# Patient Record
Sex: Female | Born: 1965
Health system: Southern US, Community
[De-identification: ages and names within clinical notes are randomized; demographics above are authoritative.]

## PROBLEM LIST (undated history)

## (undated) DIAGNOSIS — N920 Excessive and frequent menstruation with regular cycle: Secondary | ICD-10-CM

## (undated) DIAGNOSIS — Z8759 Personal history of other complications of pregnancy, childbirth and the puerperium: Secondary | ICD-10-CM

## (undated) DIAGNOSIS — E039 Hypothyroidism, unspecified: Secondary | ICD-10-CM

## (undated) DIAGNOSIS — Z9889 Other specified postprocedural states: Secondary | ICD-10-CM

## (undated) DIAGNOSIS — I10 Essential (primary) hypertension: Secondary | ICD-10-CM

## (undated) DIAGNOSIS — M1990 Unspecified osteoarthritis, unspecified site: Secondary | ICD-10-CM

## (undated) DIAGNOSIS — G43909 Migraine, unspecified, not intractable, without status migrainosus: Secondary | ICD-10-CM

## (undated) DIAGNOSIS — R112 Nausea with vomiting, unspecified: Secondary | ICD-10-CM

## (undated) DIAGNOSIS — Z87442 Personal history of urinary calculi: Secondary | ICD-10-CM

## (undated) DIAGNOSIS — K219 Gastro-esophageal reflux disease without esophagitis: Secondary | ICD-10-CM

## (undated) HISTORY — DX: Migraine, unspecified, not intractable, without status migrainosus: G43.909

---

## 1997-12-28 ENCOUNTER — Ambulatory Visit (HOSPITAL_COMMUNITY): Admission: RE | Admit: 1997-12-28 | Discharge: 1997-12-28 | Payer: Self-pay | Admitting: Gastroenterology

## 1998-07-18 ENCOUNTER — Ambulatory Visit (HOSPITAL_COMMUNITY): Admission: RE | Admit: 1998-07-18 | Discharge: 1998-07-18 | Payer: Self-pay | Admitting: Family Medicine

## 1998-07-18 ENCOUNTER — Encounter: Payer: Self-pay | Admitting: Family Medicine

## 1999-01-29 ENCOUNTER — Other Ambulatory Visit: Admission: RE | Admit: 1999-01-29 | Discharge: 1999-01-29 | Payer: Self-pay | Admitting: Obstetrics and Gynecology

## 2000-01-30 ENCOUNTER — Other Ambulatory Visit: Admission: RE | Admit: 2000-01-30 | Discharge: 2000-01-30 | Payer: Self-pay | Admitting: Obstetrics and Gynecology

## 2000-04-10 ENCOUNTER — Other Ambulatory Visit: Admission: RE | Admit: 2000-04-10 | Discharge: 2000-04-10 | Payer: Self-pay | Admitting: *Deleted

## 2000-04-10 ENCOUNTER — Encounter (INDEPENDENT_AMBULATORY_CARE_PROVIDER_SITE_OTHER): Payer: Self-pay | Admitting: Specialist

## 2000-10-05 ENCOUNTER — Other Ambulatory Visit: Admission: RE | Admit: 2000-10-05 | Discharge: 2000-10-05 | Payer: Self-pay | Admitting: Obstetrics and Gynecology

## 2000-11-16 ENCOUNTER — Other Ambulatory Visit: Admission: RE | Admit: 2000-11-16 | Discharge: 2000-11-16 | Payer: Self-pay | Admitting: Obstetrics and Gynecology

## 2001-01-01 ENCOUNTER — Encounter (INDEPENDENT_AMBULATORY_CARE_PROVIDER_SITE_OTHER): Payer: Self-pay

## 2001-01-01 ENCOUNTER — Other Ambulatory Visit: Admission: RE | Admit: 2001-01-01 | Discharge: 2001-01-01 | Payer: Self-pay | Admitting: Obstetrics and Gynecology

## 2001-06-01 ENCOUNTER — Other Ambulatory Visit: Admission: RE | Admit: 2001-06-01 | Discharge: 2001-06-01 | Payer: Self-pay | Admitting: Obstetrics and Gynecology

## 2001-11-29 ENCOUNTER — Other Ambulatory Visit: Admission: RE | Admit: 2001-11-29 | Discharge: 2001-11-29 | Payer: Self-pay | Admitting: Obstetrics and Gynecology

## 2002-05-26 ENCOUNTER — Other Ambulatory Visit: Admission: RE | Admit: 2002-05-26 | Discharge: 2002-05-26 | Payer: Self-pay | Admitting: Obstetrics and Gynecology

## 2002-11-14 ENCOUNTER — Other Ambulatory Visit: Admission: RE | Admit: 2002-11-14 | Discharge: 2002-11-14 | Payer: Self-pay | Admitting: Obstetrics and Gynecology

## 2003-06-07 ENCOUNTER — Other Ambulatory Visit: Admission: RE | Admit: 2003-06-07 | Discharge: 2003-06-07 | Payer: Self-pay | Admitting: Obstetrics and Gynecology

## 2003-08-23 ENCOUNTER — Other Ambulatory Visit: Admission: RE | Admit: 2003-08-23 | Discharge: 2003-08-23 | Payer: Self-pay | Admitting: Obstetrics and Gynecology

## 2004-02-13 ENCOUNTER — Other Ambulatory Visit: Admission: RE | Admit: 2004-02-13 | Discharge: 2004-02-13 | Payer: Self-pay | Admitting: Obstetrics and Gynecology

## 2004-03-07 ENCOUNTER — Ambulatory Visit (HOSPITAL_COMMUNITY): Admission: RE | Admit: 2004-03-07 | Discharge: 2004-03-07 | Payer: Self-pay | Admitting: Obstetrics and Gynecology

## 2004-07-12 ENCOUNTER — Other Ambulatory Visit: Admission: RE | Admit: 2004-07-12 | Discharge: 2004-07-12 | Payer: Self-pay | Admitting: Obstetrics and Gynecology

## 2005-05-07 ENCOUNTER — Other Ambulatory Visit: Admission: RE | Admit: 2005-05-07 | Discharge: 2005-05-07 | Payer: Self-pay | Admitting: Gynecology

## 2005-10-29 ENCOUNTER — Other Ambulatory Visit: Admission: RE | Admit: 2005-10-29 | Discharge: 2005-10-29 | Payer: Self-pay | Admitting: Gynecology

## 2005-11-18 ENCOUNTER — Inpatient Hospital Stay (HOSPITAL_COMMUNITY): Admission: AD | Admit: 2005-11-18 | Discharge: 2005-11-18 | Payer: Self-pay | Admitting: Gynecology

## 2005-11-24 ENCOUNTER — Ambulatory Visit: Admission: AD | Admit: 2005-11-24 | Discharge: 2005-11-24 | Payer: Self-pay | Admitting: Obstetrics and Gynecology

## 2005-11-24 ENCOUNTER — Encounter (INDEPENDENT_AMBULATORY_CARE_PROVIDER_SITE_OTHER): Payer: Self-pay | Admitting: Specialist

## 2005-11-24 HISTORY — PX: LAPAROSCOPIC UNILATERAL SALPINGECTOMY: SHX5934

## 2006-05-20 ENCOUNTER — Other Ambulatory Visit: Admission: RE | Admit: 2006-05-20 | Discharge: 2006-05-20 | Payer: Self-pay | Admitting: Gynecology

## 2007-05-26 ENCOUNTER — Other Ambulatory Visit: Admission: RE | Admit: 2007-05-26 | Discharge: 2007-05-26 | Payer: Self-pay | Admitting: Gynecology

## 2007-10-07 ENCOUNTER — Ambulatory Visit (HOSPITAL_COMMUNITY): Admission: RE | Admit: 2007-10-07 | Discharge: 2007-10-07 | Payer: Self-pay | Admitting: Obstetrics and Gynecology

## 2007-10-07 ENCOUNTER — Encounter (INDEPENDENT_AMBULATORY_CARE_PROVIDER_SITE_OTHER): Payer: Self-pay | Admitting: Obstetrics and Gynecology

## 2007-10-07 HISTORY — PX: DILATION AND EVACUATION: SHX1459

## 2010-11-21 ENCOUNTER — Other Ambulatory Visit: Payer: Self-pay | Admitting: Family Medicine

## 2010-11-21 ENCOUNTER — Ambulatory Visit
Admission: RE | Admit: 2010-11-21 | Discharge: 2010-11-21 | Disposition: A | Discharge status: Home / Self Care | Payer: Self-pay | Source: Ambulatory Visit | Attending: Family Medicine | Admitting: Family Medicine

## 2010-12-24 NOTE — Op Note (Signed)
NAMEKORYNNE, DOLS                ACCOUNT NO.:  [id number]   MEDICAL RECORD NO.:  [id number]          PATIENT TYPE:  AMB   LOCATION:  SDC                           FACILITY:  [facility]   PHYSICIAN:  Guy Sandifer. Henderson Cloud, M.D. DATE OF BIRTH:  10/28/1965   DATE OF PROCEDURE:  10/07/2007  DATE OF DISCHARGE:                               OPERATIVE REPORT   PREOPERATIVE DIAGNOSIS:  Spontaneous abortion.   POSTOPERATIVE DIAGNOSIS:  Spontaneous abortion.   PROCEDURE:  1. Dilatation and evacuation.  2. 1% Xylocaine paracervical block.   SURGEON:  Harold Hedge, MD.   ANESTHESIA:  MAC.   SPECIMENS:  Products of conception to pathology as well as a portion for  karyotype.   ESTIMATED BLOOD LOSS:  Minimal.   INDICATIONS AND CONSENT:  This patient is a 45 year old, married, white  female who has a 9-week miscarriage on ultrasound.  Dilatation and  evacuation has been discussed with the patient.  The potential risks and  complications have been discussed preoperatively including but not  limited to infection, uterine perforation, organ damage, bleeding  requiring transfusion of blood products with possible HIV and hepatitis  acquisition.  All questions were answered and consent is signed on the  chart.  We also discussed sending a portion of the tissue for karyotype.   PROCEDURE:  The patient was taken to the operating room where she was  identified, placed in dorsal supine position and she was given  intravenous sedation.  She was then placed in the dorsal lithotomy  position where she was gently prepped, bladder straight catheterized and  she was draped in a sterile fashion.  A bivalve speculum was placed.  The cervical lip was injected with 1% Xylocaine plain and grasped with a  single-tooth tenaculum. Paracervical block was then placed at the  2, 4,  5, 7, 8 and 10 o'clock positions with approximately 20 mL total of 1%  plain Xylocaine. The cervix was then gently progressively dilated to  a  31 dilator.  A #9 curved suction curette is then placed and suction  curettage is carried out for products of conception.  After the initial  pass of the curette, 20 units of Pitocin per liter of IV fluids were  started in her IV.  Alternating sharp and suction curettage is carried  out  and the cavity is clean and good hemostasis was noted.  All instruments  were removed.  All counts were correct.  A portion of the tissue was  sent for karyotype.  Blood type is O+.  The patient will be discharged  home on Methergine 0.2 mg p.o. t.i.d. for 6 doses.  Follow-up is in the  office in 2 weeks.      Guy Sandifer Henderson Cloud, M.D.  Electronically Signed     JET/MEDQ  D:  10/07/2007  T:  10/08/2007  Job:  [id number]

## 2010-12-27 NOTE — Op Note (Signed)
[name], [name]                ACCOUNT NO.:  [id number]   MEDICAL RECORD NO.:  [id number]          PATIENT TYPE:  AMB   LOCATION:  DFTL                          FACILITY:  [facility]   PHYSICIAN:  Guy Sandifer. Henderson Cloud, M.D. DATE OF BIRTH:  01-Nov-1965   DATE OF PROCEDURE:  11/24/2005  DATE OF DISCHARGE:                                 OPERATIVE REPORT   PREOPERATIVE DIAGNOSIS:  Left ectopic pregnancy.   POSTOPERATIVE DIAGNOSIS:  Left ectopic pregnancy.   PROCEDURE:  Laparoscopy with left salpingectomy.   SURGEON:  Harold Hedge, MD   ANESTHESIA:  General with endotracheal intubation, Dr. Malen Gauze.   SPECIMENS:  Left fallopian tube.   ESTIMATED BLOOD LOSS:  Drops.   INDICATIONS AND CONSENT:  This patient is a 45 year old married white female  G1 P0 with bilateral tubal occlusion diagnosed on hysterosalpingogram in  2005.  She is been followed in Dr. Corwin Levins office and was found to have a  left ectopic pregnancy.  She received a methotrexate injection approximately  1 week ago.  Her quantitative HCG's have steadily risen from 8000-19,000.  On ultrasound was also been the new development of a heart beat in the left  ectopic pregnancy.  The patient was then referred for surgical management.  Preoperatively I carefully discussed with the patient and her husband the  above findings.  We discussed laparoscopy with left salpingectomy.  We also  had a thorough discussion of the right fallopian tube.  After reviewing the  options the patient her husband wanted to retain the tube it looked  relatively normal.  If it was heavily involved with adhesions or otherwise  looked abnormal, they wanted right salpingectomy as well.  Potential risks  and complications were reviewed preoperatively including but limited to  infection, bowel, bladder, ureteral damage, bleeding requiring transfusion  of blood products with possible transfusion reaction, HIV, hepatitis  acquisition, DVT, PE, pneumonia,  laparotomy.  All questions were answered  and consent signed on the chart.   FINDINGS:  Upper abdomen is grossly normal.  Appendix was normal.  Uterus  contains several 2 cm subserosal uterine leiomyomata on the fundus.  The  right fallopian tube is relatively thin, but otherwise normal in appearance  throughout its length.  The right ovary is also relatively thin but free.  The left fallopian tube contains a 3-4 cm ectopic pregnancy in the distal  one-third.  The left ovary has a corpus luteum cyst.  The posterior cul-de-  sac contains a pseudo window and a single thin band of adhesion.  Anterior  cul-de-sac is normal.   PROCEDURE:  The patient is taken to operating room where she was identified,  placed in dorsal supine position and general anesthesia is induced via  endotracheal intubation.  She is then placed in dorsal lithotomy position,  prepped abdominally and vaginally.  Bladder straight catheterized.  Hulka  tenaculum was placed.  Uterus was manipulated, she is draped in sterile  fashion.  The infraumbilical and suprapubic areas were injected with 1/2%  plain Marcaine.  Infraumbilical incision was made and a disposable Veress  needle was  placed.  Normal syringe and drop test was noted.  2 liters of gas  were insufflated under low pressure with good tympany in the right upper  quadrant.  Veress needle was then removed.  A 10/11 disposable bladeless  Excel trocar sleeve was placed using direct visualization with the  diagnostic laparoscopic.  After placement the diagnostic scope was replaced  with the operative laparoscope.  A small suprapubic incision was made and a  5 mm bladeless Excel disposable trocar sleeve was placed under direct  visualization without difficulty.  The above findings were noted.  Using the  gyrus bipolar cautery cutting instrument.  The mesosalpinx of the left  fallopian tube is taken down throughout its length and the proximal portion  of the tube was  transected after cautery.  Excellent hemostasis was  maintained.  The left fallopian tube was then deposited in the posterior cul-  de-sac.  Then changing to the 5 mm laparoscope through the suprapubic trocar  sleeve the EndoCatch is placed through the umbilical trocar sleeve.  The  ectopic pregnancy and fallopian tube were then easily removed via the  umbilical incision without difficulty and under direct observation.  Switching back to the operative laparoscope, careful inspection and  irrigation reveals excellent hemostasis under reduced pneumoperitoneum.  Excess fluid is removed.  Suprapubic trocar sleeve was removed and good  hemostasis was noted.  After completely reducing the pneumoperitoneum, the  umbilical trocar sleeve was removed.  A 0 Vicryl suture was used to  reapproximate the subcutaneous layers of the umbilical incision with care  being taken not to pick up any underlying structures.  3-0 Vicryl was then  used to reapproximate the edges of the umbilical incision.  Dermabond was  applied to both incisions.  Hulka tenaculum was removed and good hemostasis  was noted.  All counts correct.  The patient is awakened, taken to recovery  room in stable condition.  Blood type is O+.  Follow-up is in the office in  2 weeks.      Guy Sandifer Henderson Cloud, M.D.  Electronically Signed     JET/MEDQ  D:  11/24/2005  T:  11/25/2005  Job:  [id number]   cc:   Leatha Gilding. Mezer, M.D.  Fax: [phone]

## 2011-05-02 LAB — CBC
Platelets: 316
RBC: 4.11
WBC: 12.4 — ABNORMAL HIGH

## 2011-05-07 ENCOUNTER — Other Ambulatory Visit: Payer: Self-pay | Admitting: Obstetrics and Gynecology

## 2011-08-29 ENCOUNTER — Other Ambulatory Visit: Payer: Self-pay | Admitting: Obstetrics and Gynecology

## 2013-07-21 ENCOUNTER — Ambulatory Visit
Admission: RE | Admit: 2013-07-21 | Discharge: 2013-07-21 | Disposition: A | Discharge status: Home / Self Care | Payer: Self-pay | Source: Ambulatory Visit | Attending: Orthopedic Surgery | Admitting: Orthopedic Surgery

## 2013-07-21 ENCOUNTER — Other Ambulatory Visit: Payer: Self-pay | Admitting: Orthopedic Surgery

## 2013-07-21 DIAGNOSIS — M25571 Pain in right ankle and joints of right foot: Secondary | ICD-10-CM

## 2013-10-28 ENCOUNTER — Encounter (HOSPITAL_COMMUNITY): Payer: Self-pay | Admitting: Emergency Medicine

## 2013-10-28 ENCOUNTER — Emergency Department (HOSPITAL_COMMUNITY)
Admission: EM | Admit: 2013-10-28 | Discharge: 2013-10-28 | Disposition: A | Discharge status: Home / Self Care | Payer: 59 | Attending: Emergency Medicine | Admitting: Emergency Medicine

## 2013-10-28 ENCOUNTER — Emergency Department (HOSPITAL_COMMUNITY): Payer: 59

## 2013-10-28 DIAGNOSIS — N949 Unspecified condition associated with female genital organs and menstrual cycle: Secondary | ICD-10-CM | POA: Insufficient documentation

## 2013-10-28 DIAGNOSIS — Z79899 Other long term (current) drug therapy: Secondary | ICD-10-CM | POA: Insufficient documentation

## 2013-10-28 DIAGNOSIS — E079 Disorder of thyroid, unspecified: Secondary | ICD-10-CM | POA: Insufficient documentation

## 2013-10-28 DIAGNOSIS — R112 Nausea with vomiting, unspecified: Secondary | ICD-10-CM | POA: Insufficient documentation

## 2013-10-28 DIAGNOSIS — N938 Other specified abnormal uterine and vaginal bleeding: Secondary | ICD-10-CM | POA: Insufficient documentation

## 2013-10-28 DIAGNOSIS — N201 Calculus of ureter: Secondary | ICD-10-CM | POA: Insufficient documentation

## 2013-10-28 LAB — COMPREHENSIVE METABOLIC PANEL
ALBUMIN: 3.9 g/dL (ref 3.5–5.2)
ALK PHOS: 72 U/L (ref 39–117)
ALT: 24 U/L (ref 0–35)
AST: 32 U/L (ref 0–37)
BUN: 19 mg/dL (ref 6–23)
CO2: 23 mEq/L (ref 19–32)
Calcium: 9.2 mg/dL (ref 8.4–10.5)
Chloride: 102 mEq/L (ref 96–112)
Creatinine, Ser: 0.91 mg/dL (ref 0.50–1.10)
GFR calc Af Amer: 87 mL/min — ABNORMAL LOW (ref >90)
GFR calc non Af Amer: 75 mL/min — ABNORMAL LOW (ref >90)
Glucose, Bld: 137 mg/dL — ABNORMAL HIGH (ref 70–99)
POTASSIUM: 4.3 meq/L (ref 3.7–5.3)
Sodium: 139 mEq/L (ref 137–147)
TOTAL PROTEIN: 7.2 g/dL (ref 6.0–8.3)
Total Bilirubin: 0.4 mg/dL (ref 0.3–1.2)

## 2013-10-28 LAB — CBC WITH DIFFERENTIAL/PLATELET
BASOS ABS: 0 10*3/uL (ref 0.0–0.1)
BASOS PCT: 0 % (ref 0–1)
EOS ABS: 0.1 10*3/uL (ref 0.0–0.7)
Eosinophils Relative: 1 % (ref 0–5)
HCT: 40.1 % (ref 36.0–46.0)
HEMOGLOBIN: 13.8 g/dL (ref 12.0–15.0)
Lymphocytes Relative: 13 % (ref 12–46)
Lymphs Abs: 1.4 10*3/uL (ref 0.7–4.0)
MCH: 31.5 pg (ref 26.0–34.0)
MCHC: 34.4 g/dL (ref 30.0–36.0)
MCV: 91.6 fL (ref 78.0–100.0)
MONOS PCT: 5 % (ref 3–12)
Monocytes Absolute: 0.5 10*3/uL (ref 0.1–1.0)
NEUTROS PCT: 81 % — AB (ref 43–77)
Neutro Abs: 8.7 10*3/uL — ABNORMAL HIGH (ref 1.7–7.7)
PLATELETS: 264 10*3/uL (ref 150–400)
RBC: 4.38 MIL/uL (ref 3.87–5.11)
RDW: 12.6 % (ref 11.5–15.5)
WBC: 10.7 10*3/uL — ABNORMAL HIGH (ref 4.0–10.5)

## 2013-10-28 LAB — URINALYSIS, ROUTINE W REFLEX MICROSCOPIC
BILIRUBIN URINE: NEGATIVE
Glucose, UA: NEGATIVE mg/dL
Ketones, ur: NEGATIVE mg/dL
LEUKOCYTES UA: NEGATIVE
NITRITE: NEGATIVE
PH: 7.5 (ref 5.0–8.0)
Protein, ur: NEGATIVE mg/dL
SPECIFIC GRAVITY, URINE: 1.015 (ref 1.005–1.030)
Urobilinogen, UA: 0.2 mg/dL (ref 0.0–1.0)

## 2013-10-28 LAB — URINE MICROSCOPIC-ADD ON

## 2013-10-28 LAB — POC URINE PREG, ED: Preg Test, Ur: NEGATIVE

## 2013-10-28 LAB — LIPASE, BLOOD: LIPASE: 24 U/L (ref 11–59)

## 2013-10-28 MED ORDER — SODIUM CHLORIDE 0.9 % IV SOLN
1000.0000 mL | INTRAVENOUS | Status: DC
  Administered 2013-10-28: 1000 mL via INTRAVENOUS

## 2013-10-28 MED ORDER — KETOROLAC TROMETHAMINE 30 MG/ML IJ SOLN
30.0000 mg | Freq: Once | INTRAMUSCULAR | Status: AC
  Administered 2013-10-28: 30 mg via INTRAVENOUS
  Filled 2013-10-28: qty 1

## 2013-10-28 MED ORDER — SODIUM CHLORIDE 0.9 % IV SOLN
1000.0000 mL | Freq: Once | INTRAVENOUS | Status: AC
  Administered 2013-10-28: 1000 mL via INTRAVENOUS

## 2013-10-28 MED ORDER — PROMETHAZINE HCL 25 MG/ML IJ SOLN
12.5000 mg | Freq: Once | INTRAMUSCULAR | Status: AC
  Administered 2013-10-28: 12.5 mg via INTRAVENOUS
  Filled 2013-10-28: qty 1

## 2013-10-28 MED ORDER — OXYCODONE-ACETAMINOPHEN 5-325 MG PO TABS: 1.0000 | ORAL_TABLET | Freq: Four times a day (QID) | ORAL | Status: DC | PRN

## 2013-10-28 MED ORDER — ONDANSETRON HCL 4 MG/2ML IJ SOLN
4.0000 mg | Freq: Once | INTRAMUSCULAR | Status: AC
  Administered 2013-10-28: 4 mg via INTRAVENOUS
  Filled 2013-10-28: qty 2

## 2013-10-28 MED ORDER — HYDROMORPHONE HCL PF 1 MG/ML IJ SOLN
1.0000 mg | INTRAMUSCULAR | Status: DC | PRN
  Administered 2013-10-28 (×2): 1 mg via INTRAVENOUS
  Filled 2013-10-28 (×2): qty 1

## 2013-10-28 MED ORDER — PROMETHAZINE HCL 25 MG PO TABS: 25.0000 mg | ORAL_TABLET | Freq: Four times a day (QID) | ORAL | Status: DC | PRN

## 2013-10-28 NOTE — ED Notes (Signed)
Pt complains of left flank pain since 430 am, has had one kidney stone before and the pain is different this time, pt has been vomiting

## 2013-10-28 NOTE — Discharge Instructions (Signed)
Kidney Stones Kidney stones (urolithiasis) are solid masses that form inside your kidneys. The intense pain is caused by the stone moving through the kidney, ureter, bladder, and urethra (urinary tract). When the stone moves, the ureter starts to spasm around the stone. The stone is usually passed in your pee (urine).  HOME CARE  Drink enough fluids to keep your pee clear or pale yellow. This helps to get the stone out.  Strain all pee through the provided strainer. Do not pee without peeing through the strainer, not even once. If you pee the stone out, catch it in the strainer. The stone may be as small as a grain of salt. Take this to your doctor. This will help your doctor figure out what you can do to try to prevent more kidney stones.  Only take medicine as told by your doctor.  Follow up with your doctor as told.  Get follow-up X-rays as told by your doctor. GET HELP IF: You have pain that gets worse even if you have been taking pain medicine. GET HELP RIGHT AWAY IF:   Your pain does not get better with medicine.  You have a fever or shaking chills.  Your pain increases and gets worse over 18 hours.  You have new belly (abdominal) pain.  You feel faint or pass out.  You are unable to pee. MAKE SURE YOU:   Understand these instructions.  Will watch your condition.  Will get help right away if you are not doing well or get worse. Document Released: 01/14/2008 Document Revised: 03/30/2013 Document Reviewed: 12/29/2012 Exit[employer] Patient Information 2014 [employer].  

## 2013-10-28 NOTE — ED Provider Notes (Signed)
CSN: [id number]     Arrival date & time 10/28/13  [id number] History  First MD Initiated Contact with Patient 10/28/13 [phone]     Chief Complaint  Patient presents with  . Flank Pain    Patient is a 48 y.o. female presenting with flank pain. The history is provided by the patient.  Flank Pain This is a new problem. The current episode started 3 to 5 hours ago. The problem occurs constantly. Associated symptoms include abdominal pain. Pertinent negatives include no chest pain and no headaches. Nothing (she can't get comfortable staying still) aggravates the symptoms. Nothing relieves the symptoms. She has tried nothing for the symptoms.  Pt has a history of kidney stones.  This feels similar.  She is currently on her menses so she is not sure if there is blood in her urine.  Past Medical History  Diagnosis Date  . Thyroid disease    History reviewed. No pertinent past surgical history. History reviewed. No pertinent family history. History  Substance Use Topics  . Smoking status: Never Smoker   . Smokeless tobacco: Not on file  . Alcohol Use: No   OB History   Grav Para Term Preterm Abortions TAB SAB Ect Mult Living                 Review of Systems  Constitutional: Negative for fever.  Cardiovascular: Negative for chest pain.  Gastrointestinal: Positive for nausea, vomiting and abdominal pain. Negative for diarrhea.  Genitourinary: Positive for flank pain and vaginal bleeding (currently on menses).  Neurological: Negative for headaches.  All other systems reviewed and are negative.      Allergies  Review of patient's allergies indicates no known allergies.  Home Medications   Current Outpatient Rx  Name  Route  Sig  Dispense  Refill  . calcium-vitamin D (OSCAL WITH D) 500-200 MG-UNIT per tablet   Oral   Take 2 tablets by mouth daily with breakfast.         . ibuprofen (ADVIL,MOTRIN) 200 MG tablet   Oral   Take 400 mg by mouth every 4 (four) hours as needed for moderate  pain or cramping.         Marland Kitchen levothyroxine (SYNTHROID, LEVOTHROID) 88 MCG tablet   Oral   Take 88 mcg by mouth daily before breakfast.         . Multiple Vitamin (MULTIVITAMIN WITH MINERALS) TABS tablet   Oral   Take 1 tablet by mouth daily.         Marland Kitchen oxyCODONE-acetaminophen (ROXICET) 5-325 MG per tablet   Oral   Take 1-2 tablets by mouth every 6 (six) hours as needed for severe pain.   30 tablet   0   . promethazine (PHENERGAN) 25 MG tablet   Oral   Take 1 tablet (25 mg total) by mouth every 6 (six) hours as needed for nausea or vomiting.   30 tablet   0    BP 124/73  Pulse 82  Temp(Src) 97.5 F (36.4 C) (Oral)  Resp 18  SpO2 100%  LMP 10/28/2013 Physical Exam  Nursing note and vitals reviewed. Constitutional: She appears well-developed and well-nourished. No distress.  HENT:  Head: Normocephalic and atraumatic.  Right Ear: External ear normal.  Left Ear: External ear normal.  Eyes: Conjunctivae are normal. Right eye exhibits no discharge. Left eye exhibits no discharge. No scleral icterus.  Neck: Neck supple. No tracheal deviation present.  Cardiovascular: Normal rate, regular rhythm and intact distal pulses.  Pulmonary/Chest: Effort normal and breath sounds normal. No stridor. No respiratory distress. She has no wheezes. She has no rales.  Abdominal: Soft. Bowel sounds are normal. She exhibits no distension. There is generalized tenderness. There is CVA tenderness (left sided). There is no rebound and no guarding. No hernia.  Musculoskeletal: She exhibits no edema and no tenderness.  Neurological: She is alert. She has normal strength. No cranial nerve deficit (no facial droop, extraocular movements intact, no slurred speech) or sensory deficit. She exhibits normal muscle tone. She displays no seizure activity. Coordination normal.  Skin: Skin is warm and dry. No rash noted.  Psychiatric: She has a normal mood and affect.    ED Course  Procedures (including  critical care time) Labs Review Labs Reviewed  CBC WITH DIFFERENTIAL - Abnormal; Notable for the following:    WBC 10.7 (*)    Neutrophils Relative % 81 (*)    Neutro Abs 8.7 (*)    All other components within normal limits  COMPREHENSIVE METABOLIC PANEL - Abnormal; Notable for the following:    Glucose, Bld 137 (*)    GFR calc non Af Amer 75 (*)    GFR calc Af Amer 87 (*)    All other components within normal limits  URINALYSIS, ROUTINE W REFLEX MICROSCOPIC - Abnormal; Notable for the following:    Hgb urine dipstick SMALL (*)    All other components within normal limits  LIPASE, BLOOD  URINE MICROSCOPIC-ADD ON  POC URINE PREG, ED   Imaging Review Ct Abdomen Pelvis Wo Contrast  10/28/2013   CLINICAL DATA:  Left flank pain.  EXAM: CT ABDOMEN AND PELVIS WITHOUT CONTRAST  TECHNIQUE: Multidetector CT imaging of the abdomen and pelvis was performed following the standard protocol without intravenous contrast.  COMPARISON:  11/21/2010  FINDINGS: Dependent atelectasis in the lungs. No effusions. Heart is normal size.  5 mm stone at the left ureterovesical junction. Mild left hydronephrosis and perinephric stranding. No renal or ureteral stones on the right. Small hypodensities are scattered within the right lobe of the liver. These cannot be characterized due to the noncontrast nature of the study and their small size but appear stated since prior study and are most likely small cysts. Spleen, pancreas, adrenals have an unremarkable unenhanced appearance.  Nodular contours of the uterus suggesting fibroids. Urinary bladder and adnexa are unremarkable. Appendix is visualized and is normal.  IMPRESSION: 5 mm left UVJ stone with mild left hydronephrosis and perinephric stranding.  Uterine fibroids.   Electronically Signed   By: Rolm Baptise M.D.   On: 10/28/2013 09:10   Medications  0.9 %  sodium chloride infusion (1,000 mLs Intravenous New Bag/Given 10/28/13 0732)    Followed by  0.9 %  sodium  chloride infusion (1,000 mLs Intravenous New Bag/Given 10/28/13 0828)  HYDROmorphone (DILAUDID) injection 1 mg (1 mg Intravenous Given 10/28/13 0840)  ondansetron (ZOFRAN) injection 4 mg (4 mg Intravenous Given 10/28/13 0732)  ondansetron (ZOFRAN) injection 4 mg (4 mg Intravenous Given 10/28/13 0840)  ketorolac (TORADOL) 30 MG/ML injection 30 mg (30 mg Intravenous Given 10/28/13 0938)  promethazine (PHENERGAN) injection 12.5 mg (12.5 mg Intravenous Given 10/28/13 0938)     0940  Pt is still having persistent nausea and vomiting.   Will try phenergan.  CT scan confirms ureteral stone .  Will attempt to control her pain and nausea.   1109  Feeling better.  No further vomiting.  Will make sure she can tolerate po.  If so dc with  urine strainer and pain meds. MDM   Final diagnoses:  Ureteral stone   No renal insufficiency.  Dc home with pain meds and antiemetics.  Urology follow up    Kathalene Frames, MD 10/28/13 1110

## 2013-11-28 ENCOUNTER — Other Ambulatory Visit: Payer: Self-pay | Admitting: Urology

## 2014-01-06 ENCOUNTER — Ambulatory Visit (HOSPITAL_BASED_OUTPATIENT_CLINIC_OR_DEPARTMENT_OTHER): Admission: RE | Admit: 2014-01-06 | Payer: 59 | Source: Ambulatory Visit | Admitting: Urology

## 2014-01-06 ENCOUNTER — Encounter (HOSPITAL_BASED_OUTPATIENT_CLINIC_OR_DEPARTMENT_OTHER): Admission: RE | Payer: Self-pay | Source: Ambulatory Visit

## 2014-01-06 SURGERY — CYSTOSCOPY/RETROGRADE/URETEROSCOPY
Anesthesia: General

## 2014-08-11 HISTORY — PX: DILATATION & CURETTAGE/HYSTEROSCOPY WITH MYOSURE: SHX6511

## 2014-11-02 ENCOUNTER — Other Ambulatory Visit: Payer: Self-pay

## 2014-11-03 LAB — CYTOLOGY - PAP

## 2015-03-08 ENCOUNTER — Other Ambulatory Visit: Payer: Self-pay | Admitting: Obstetrics and Gynecology

## 2015-09-21 ENCOUNTER — Other Ambulatory Visit: Payer: Self-pay | Admitting: Physician Assistant

## 2015-09-21 DIAGNOSIS — D485 Neoplasm of uncertain behavior of skin: Secondary | ICD-10-CM | POA: Diagnosis not present

## 2015-09-21 DIAGNOSIS — D239 Other benign neoplasm of skin, unspecified: Secondary | ICD-10-CM | POA: Diagnosis not present

## 2015-09-21 DIAGNOSIS — D2271 Melanocytic nevi of right lower limb, including hip: Secondary | ICD-10-CM | POA: Diagnosis not present

## 2015-09-28 MED FILL — RELPAX 40 MG TABLET: 40 | 23 days supply | Qty: 6 | Fill #0

## 2015-10-12 MED FILL — AZITHROMYCIN 250 MG TABLET: 250 | 5 days supply | Qty: 6 | Fill #0

## 2015-10-31 MED FILL — RELPAX 40 MG TABLET: 40 | 23 days supply | Qty: 6 | Fill #0

## 2015-11-07 DIAGNOSIS — Z01419 Encounter for gynecological examination (general) (routine) without abnormal findings: Secondary | ICD-10-CM | POA: Diagnosis not present

## 2015-11-07 DIAGNOSIS — Z6828 Body mass index (BMI) 28.0-28.9, adult: Secondary | ICD-10-CM | POA: Diagnosis not present

## 2015-12-14 MED FILL — RELPAX 40 MG TABLET: 40 | 23 days supply | Qty: 6 | Fill #1

## 2016-01-09 MED FILL — RELPAX 40 MG TABLET: 40 | 30 days supply | Qty: 9 | Fill #0

## 2016-02-20 ENCOUNTER — Telehealth: Payer: Self-pay | Admitting: Family

## 2016-02-20 DIAGNOSIS — J069 Acute upper respiratory infection, unspecified: Secondary | ICD-10-CM

## 2016-02-20 MED FILL — RELPAX 40 MG TABLET: 40 | 21 days supply | Qty: 6 | Fill #0

## 2016-02-20 NOTE — Progress Notes (Signed)

## 2016-02-26 DIAGNOSIS — J329 Chronic sinusitis, unspecified: Secondary | ICD-10-CM | POA: Diagnosis not present

## 2016-02-26 DIAGNOSIS — G43009 Migraine without aura, not intractable, without status migrainosus: Secondary | ICD-10-CM | POA: Diagnosis not present

## 2016-02-26 MED FILL — AMOX-CLAV 875-125 MG TABLET: 875-125 | 10 days supply | Qty: 20 | Fill #0

## 2016-02-28 DIAGNOSIS — E039 Hypothyroidism, unspecified: Secondary | ICD-10-CM | POA: Diagnosis not present

## 2016-02-28 DIAGNOSIS — Z0000 Encounter for general adult medical examination without abnormal findings: Secondary | ICD-10-CM | POA: Diagnosis not present

## 2016-02-28 DIAGNOSIS — J988 Other specified respiratory disorders: Secondary | ICD-10-CM | POA: Diagnosis not present

## 2016-03-10 DIAGNOSIS — E7800 Pure hypercholesterolemia, unspecified: Secondary | ICD-10-CM | POA: Diagnosis not present

## 2016-03-10 DIAGNOSIS — E039 Hypothyroidism, unspecified: Secondary | ICD-10-CM | POA: Diagnosis not present

## 2016-03-14 MED FILL — ELETRIPTAN HBR 40 MG TABLET: 40 | 30 days supply | Qty: 9 | Fill #0

## 2016-04-08 MED FILL — ELETRIPTAN HBR 40 MG TABLET: 40 | 30 days supply | Qty: 9 | Fill #1

## 2016-04-09 DIAGNOSIS — D239 Other benign neoplasm of skin, unspecified: Secondary | ICD-10-CM | POA: Diagnosis not present

## 2016-05-06 ENCOUNTER — Encounter: Payer: 59 | Attending: Family Medicine | Admitting: Dietician

## 2016-05-06 DIAGNOSIS — Z713 Dietary counseling and surveillance: Secondary | ICD-10-CM | POA: Insufficient documentation

## 2016-05-06 MED FILL — ELETRIPTAN HBR 40 MG TABLET: 40 | 30 days supply | Qty: 9 | Fill #2

## 2016-05-06 NOTE — Progress Notes (Signed)
  Medical Nutrition Therapy:  Appt start time: 0740 end time:  0840.   Assessment:  Primary concerns today: [name] is here today to make sure she is eating right since her mother thinks she is "obese". She would also like her clothes to fit better than they do. Weight now is highest in her life. Lost her husband 3 years ago and lost weight at that time (got down to 145 lbs) and was doing a lot more activity and had cut out soda and some starchy foods.   Has been going to the gym for the past year and works out with a Clinical research associate and family is "big boned". Feels more toned than she was before. Has tried diet plans like doing 2 protein shakes per day where she lost about 10 lbs.  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 Lives by herself. Does not skip meals. Gets lunch brought in to work on Tuesday and goes out to eat on payday (cheesesteak).   Suffers from migraines which she gets often. Stays away from nitrates, a lot of salt, sugar, and caffeine since they trigger migraines.  Would love to get weight back to 140-150 lbs (losing 20 lbs).  Planning to get back to water aerobics soon. Has some financial issues which make paying for exercise difficult.   Voiced some resistance with changes suggested.  Preferred Learning Style:   No preference indicated   Learning Readiness:   Contemplating   MEDICATIONS: see list   DIETARY INTAKE:  Usual eating pattern includes 3 meals and 0-3 snacks per day.  Avoided foods include brussels sprouts, asparagus, pork    24-hr recall:  B ( AM): protein shake from grocery store with fruit, almonds, unsweet almond milk with part of banana or brunch on weekends Snk ( AM):cottage cheese and fruit or yogurt L ( PM): might skip on weeekend or sandwich ([place] or chicken with cheese) and sometimes with yogurt and fruit Snk ( PM): sometimes yogurt or chocolate or nuts  D ( PM): cook on Sunday, then cooks mid week - peas with breaded chicken breast or  spaghetti with [place] meat or taco bake pie or steak/fish with salad/steamed veggies and baked potato/rice or chicken pot pie Snk ( PM): ice cream or cookies  Beverages: water, soda at lunch, zero calorie flavored water, sweet tea or lemonade 1 x week    Usual physical activity: walking dogs 4 miles per day, trainer 2 x week for 30 minutes (going to change gyms soon)  Estimated energy needs: 1800 calories 200 g carbohydrates 135 g protein 50 g fat  Progress Towards Goal(s):  In progress.   Nutritional Diagnosis:  [place]-3.3 Overweight/obesity As related to hx of large portion sizes .  As evidenced by BMI of 28.1 and 20-30 lb weight gain in past 3 years.    Intervention:  Nutrition counseling provided. Plan: Try to increase vegetables at lunch and maybe some dinner meals. Try adding carrots/celery to side of lunch meal with a little salad dressing/dip. Have carbs with protein for meals/snacks. For breakfast - continue with protein shake or oatmeal with protein or cottage cheese with fruit.  Teaching Method Utilized:  Visual Auditory Hands on  Handouts given during visit include:  MyPlate Handout  Meal card  15 g CHO snacks  Barriers to learning/adherence to lifestyle change: stress, financial issues  Demonstrated degree of understanding via:  Teach Back   Monitoring/Evaluation:  Dietary intake, exercise, and body weight in 1 month(s).

## 2016-05-06 NOTE — Patient Instructions (Addendum)
Try to increase vegetables at lunch and maybe some dinner meals. Try adding carrots/celery to side of lunch meal with a little salad dressing/dip. Have carbs with protein for meals/snacks. For breakfast - continue with protein shake or oatmeal with protein or cottage cheese with fruit.

## 2016-05-09 DIAGNOSIS — J069 Acute upper respiratory infection, unspecified: Secondary | ICD-10-CM | POA: Diagnosis not present

## 2016-05-09 DIAGNOSIS — Z23 Encounter for immunization: Secondary | ICD-10-CM | POA: Diagnosis not present

## 2016-05-23 DIAGNOSIS — E039 Hypothyroidism, unspecified: Secondary | ICD-10-CM | POA: Diagnosis not present

## 2016-06-05 ENCOUNTER — Encounter: Payer: 59 | Attending: Family Medicine | Admitting: Dietician

## 2016-06-05 DIAGNOSIS — Z713 Dietary counseling and surveillance: Secondary | ICD-10-CM | POA: Insufficient documentation

## 2016-06-05 NOTE — Progress Notes (Signed)
Medical Nutrition Therapy:  Appt start time: 0520 end time:  615   Assessment:  Primary concerns today: [name] returns today with a 5 lb weight gain in past month. Trying to add more vegetables and increasing exercise. Also using smaller plates. Trying to not have seconds. Overall eating smaller portions. Drinking more water.   Cokes will help when she has a migraine.  Hunger level varies throughout the date. Sometimes get really hungry, salads fill her up but then she gets hungry again soon after.    She would also like her clothes to fit better than they do. Weight now is highest in her life. Lost her husband 3 years ago and lost weight at that time (got down to 145 lbs) and was doing a lot more activity and had cut out soda and some starchy foods.   Has been going to the gym for the past year and works out with a Clinical research associate and family is "big boned". Feels more toned than she was before. Has tried diet plans like doing 2 protein shakes per day where she lost about 10 lbs.  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 Lives by herself. Does not skip meals. Gets lunch brought in to work on Tuesday and goes out to eat on payday (cheesesteak).   Suffers from migraines which she gets often. Stays away from nitrates, a lot of salt, sugar, and caffeine since they trigger migraines.  Would love to get weight back to 140-150 lbs (losing 20 lbs).  Planning to get back to water aerobics soon. Has some financial issues which make paying for exercise difficult.   Voiced some resistance with changes suggested.  Preferred Learning Style:   No preference indicated   Learning Readiness:   Contemplating   MEDICATIONS: see list   DIETARY INTAKE:  Usual eating pattern includes 3 meals and 0-3 snacks per day.  Avoided foods include brussels sprouts, asparagus, pork    24-hr recall:  B ( AM): protein shake from grocery store with fruit, almonds, unsweet almond milk with part of banana or  corn beef hash, eggs and bacon on weekend Snk ( AM):none or cottage cheese and fruit or fruit L ( PM): salad with protein and fruit or tuna salad/chicken salad with crackers with yogurt or might go out to eat on weekends and have broiled fish with broccoli and sweet potatoes, meal from rep 1 x week - varies ([place], sandwiches, etc)  Snk ( PM): sometimes [employer] or nuts or peanut butter crackers when has a migraine D ( PM): vegetable and protein and trying to avoid starches  Snk ( PM): cookies  Beverages: water, regular or diet soda at lunch, zero calorie flavored water  Usual physical activity: walking dogs 4 miles per day, gym 2-3 x week classes/water classes  Estimated energy needs: 1800 calories 200 g carbohydrates 135 g protein 50 g fat  Progress Towards Goal(s):  In progress.   Nutritional Diagnosis:  [place]-3.3 Overweight/obesity As related to hx of large portion sizes .  As evidenced by BMI of 28.1 and 20-30 lb weight gain in past 3 years.    Intervention:  Nutrition counseling provided. Plan: Try cutting out soda on days when you don't have migraines. Try to have crystal light/minute maid lemonade at lunch. Continue to watch sugar from candy/cookies. Consider taking a fish oil pill to possibly help with migraines.   Teaching Method Utilized:  Visual Auditory Hands on  Handouts given during visit include:  none  Barriers to learning/adherence to lifestyle change: stress, financial issues  Demonstrated degree of understanding via:  Teach Back   Monitoring/Evaluation:  Dietary intake, exercise, and body weight prn.

## 2016-06-05 NOTE — Patient Instructions (Addendum)
Try cutting out soda on days when you don't have migraines. Try to have crystal light/minute maid lemonade at lunch. Continue to watch sugar from candy/cookies. Consider taking a fish oil pill to possibly help with migraines.

## 2016-06-06 MED FILL — ELETRIPTAN HBR 40 MG TABLET: 40 | 30 days supply | Qty: 9 | Fill #3

## 2016-06-20 DIAGNOSIS — M2240 Chondromalacia patellae, unspecified knee: Secondary | ICD-10-CM | POA: Diagnosis not present

## 2016-06-20 DIAGNOSIS — M7061 Trochanteric bursitis, right hip: Secondary | ICD-10-CM | POA: Diagnosis not present

## 2016-07-01 MED FILL — ELETRIPTAN HBR 40 MG TABLET: 40 | 30 days supply | Qty: 9 | Fill #4

## 2016-07-16 DIAGNOSIS — S0501XA Injury of conjunctiva and corneal abrasion without foreign body, right eye, initial encounter: Secondary | ICD-10-CM | POA: Diagnosis not present

## 2016-07-18 DIAGNOSIS — S0501XD Injury of conjunctiva and corneal abrasion without foreign body, right eye, subsequent encounter: Secondary | ICD-10-CM | POA: Diagnosis not present

## 2016-07-25 MED FILL — ELETRIPTAN HBR 40 MG TABLET: 40 | 30 days supply | Qty: 9 | Fill #5

## 2016-07-29 DIAGNOSIS — G43009 Migraine without aura, not intractable, without status migrainosus: Secondary | ICD-10-CM | POA: Diagnosis not present

## 2016-08-08 DIAGNOSIS — Z1231 Encounter for screening mammogram for malignant neoplasm of breast: Secondary | ICD-10-CM | POA: Diagnosis not present

## 2016-08-14 DIAGNOSIS — N201 Calculus of ureter: Secondary | ICD-10-CM | POA: Diagnosis not present

## 2016-08-14 DIAGNOSIS — R109 Unspecified abdominal pain: Secondary | ICD-10-CM | POA: Diagnosis not present

## 2016-08-14 DIAGNOSIS — N202 Calculus of kidney with calculus of ureter: Secondary | ICD-10-CM | POA: Diagnosis not present

## 2016-08-14 DIAGNOSIS — R310 Gross hematuria: Secondary | ICD-10-CM | POA: Diagnosis not present

## 2016-08-14 MED FILL — TAMSULOSIN HCL 0.4 MG CAP: 0.4 | 30 days supply | Qty: 30 | Fill #0

## 2016-08-14 MED FILL — KETOROLAC 10 MG TABLET: 10 | 5 days supply | Qty: 20 | Fill #0

## 2016-08-14 MED FILL — PROMETHAZINE 12.5 MG TABLET: 12.5 | 5 days supply | Qty: 20 | Fill #0

## 2016-08-21 ENCOUNTER — Telehealth: Payer: 59 | Admitting: Family

## 2016-08-21 DIAGNOSIS — B9689 Other specified bacterial agents as the cause of diseases classified elsewhere: Secondary | ICD-10-CM

## 2016-08-21 DIAGNOSIS — J329 Chronic sinusitis, unspecified: Secondary | ICD-10-CM

## 2016-08-21 MED ORDER — AMOXICILLIN-POT CLAVULANATE 875-125 MG PO TABS: 1.0000 | ORAL_TABLET | Freq: Two times a day (BID) | ORAL | 0 refills | Status: AC

## 2016-08-21 NOTE — Progress Notes (Signed)

## 2016-08-25 MED FILL — ELETRIPTAN HBR 40 MG TABLET: 40 | 30 days supply | Qty: 9 | Fill #6

## 2016-08-26 ENCOUNTER — Telehealth: Payer: Self-pay | Admitting: *Deleted

## 2016-08-26 NOTE — Telephone Encounter (Signed)
Called and spoke to pt. Pt agreeable to come at 4pm instead tomorrow d/t inclement weather. Advised her to check in by 330pm. Bring insurance card, cop ay and updated med list. She verbalized understating.

## 2016-08-27 ENCOUNTER — Ambulatory Visit: Payer: Self-pay | Admitting: Neurology

## 2016-08-27 ENCOUNTER — Ambulatory Visit: Payer: 59 | Admitting: Neurology

## 2016-09-03 ENCOUNTER — Telehealth: Payer: 59 | Admitting: Family

## 2016-09-03 DIAGNOSIS — B3731 Acute candidiasis of vulva and vagina: Secondary | ICD-10-CM

## 2016-09-03 DIAGNOSIS — B373 Candidiasis of vulva and vagina: Secondary | ICD-10-CM | POA: Diagnosis not present

## 2016-09-03 MED ORDER — FLUCONAZOLE 150 MG PO TABS: 150.0000 mg | ORAL_TABLET | ORAL | 0 refills | Status: DC | PRN

## 2016-09-03 NOTE — Progress Notes (Signed)

## 2016-09-04 ENCOUNTER — Telehealth: Payer: 59 | Admitting: Physician Assistant

## 2016-09-04 DIAGNOSIS — R1084 Generalized abdominal pain: Secondary | ICD-10-CM | POA: Diagnosis not present

## 2016-09-04 DIAGNOSIS — N898 Other specified noninflammatory disorders of vagina: Secondary | ICD-10-CM

## 2016-09-04 DIAGNOSIS — R102 Pelvic and perineal pain: Secondary | ICD-10-CM | POA: Diagnosis not present

## 2016-09-04 DIAGNOSIS — R300 Dysuria: Secondary | ICD-10-CM | POA: Diagnosis not present

## 2016-09-04 NOTE — Progress Notes (Signed)
Based on what you shared with me it looks like you have a condition that should be evaluated in a face to face office visit.    You need assessment to check urine and vaginal swab to assess cause of symptoms. Since you are having both vaginal discharge and urinary symptoms there may be a combination of things going on.   If you are having a true medical emergency please call 911.  If you need an urgent face to face visit, [facility] has four urgent care centers for your convenience.  If you need care fast and have a high deductible or no insurance consider:   [email or web address]  [phone]  [address], [place], [place] [address] 8 am to 8 pm Monday-Friday 10 am to 4 pm Saturday-Sunday   The following sites will take your  insurance:    . [facility] a Provider at this Location  [address] [place], [place] [address] . 10 am to 8 pm Monday-Friday . 12 pm to 8 pm Saturday-Sunday   . [facility] [place] a Provider at this Location  [address], [place], [place] [address] . 8 am to 8 pm Monday-Friday . 9 am to 6 pm Saturday . 11 am to 6 pm Sunday   . [facility] at [place] Get Driving Directions  [id number] [place],  [address] . 8 am to 8 pm Monday-Friday . 8 am to 4 pm Saturday-Sunday   Your e-visit answers were reviewed by a board certified advanced clinical practitioner to complete your personal care plan.  Thank you for using e-Visits.

## 2016-09-15 ENCOUNTER — Encounter: Payer: Self-pay | Admitting: Neurology

## 2016-09-15 ENCOUNTER — Ambulatory Visit (INDEPENDENT_AMBULATORY_CARE_PROVIDER_SITE_OTHER): Payer: 59 | Admitting: Neurology

## 2016-09-15 ENCOUNTER — Ambulatory Visit: Payer: Self-pay | Admitting: Neurology

## 2016-09-15 VITALS — BP 130/80 | HR 83 | Ht 67.0 in | Wt 178.8 lb

## 2016-09-15 DIAGNOSIS — G43009 Migraine without aura, not intractable, without status migrainosus: Secondary | ICD-10-CM | POA: Diagnosis not present

## 2016-09-15 MED ORDER — TOPIRAMATE 25 MG PO TABS: 75.0000 mg | ORAL_TABLET | Freq: Every day | ORAL | 11 refills | Status: DC

## 2016-09-15 NOTE — Patient Instructions (Addendum)
Remember to drink plenty of fluid, eat healthy meals and do not skip any meals. Try to eat protein with a every meal and eat a healthy snack such as fruit or nuts in between meals. Try to keep a regular sleep-wake schedule and try to exercise daily, particularly in the form of walking, 20-30 minutes a day, if you can.   As far as your medications are concerned, I would like to suggest: Topiramate 75mg  every night   Our phone number is [phone]. We also have an after hours call service for urgent matters and there is a physician on-call for urgent questions. For any emergencies you know to call 911 or go to the nearest emergency room  Topiramate tablets What is this medicine? TOPIRAMATE (toe PYRE a mate) is used to treat seizures in adults or children with epilepsy. It is also used for the prevention of migraine headaches. This medicine may be used for other purposes; ask your health care provider or pharmacist if you have questions. COMMON BRAND NAME(S): Topamax, Topiragen What should I tell my health care provider before I take this medicine? They need to know if you have any of these conditions: -bleeding disorders -cirrhosis of the liver or liver disease -diarrhea -glaucoma -kidney stones or kidney disease -low blood counts, like low white cell, platelet, or red cell counts -lung disease like asthma, obstructive pulmonary disease, emphysema -metabolic acidosis -on a ketogenic diet -schedule for surgery or a procedure -suicidal thoughts, plans, or attempt; a previous suicide attempt by you or a family member -an unusual or allergic reaction to topiramate, other medicines, foods, dyes, or preservatives -pregnant or trying to get pregnant -breast-feeding How should I use this medicine? Take this medicine by mouth with a glass of water. Follow the directions on the prescription label. Do not crush or chew. You may take this medicine with meals. Take your medicine at regular intervals.  Do not take it more often than directed. Talk to your pediatrician regarding the use of this medicine in children. Special care may be needed. While this drug may be prescribed for children as young as 89 years of age for selected conditions, precautions do apply. Overdosage: If you think you have taken too much of this medicine contact a poison control center or emergency room at once. NOTE: This medicine is only for you. Do not share this medicine with others. What if I miss a dose? If you miss a dose, take it as soon as you can. If your next dose is to be taken in less than 6 hours, then do not take the missed dose. Take the next dose at your regular time. Do not take double or extra doses. What may interact with this medicine? Do not take this medicine with any of the following medications: -probenecid This medicine may also interact with the following medications: -acetazolamide -alcohol -amitriptyline -aspirin and aspirin-like medicines -birth control pills -certain medicines for depression -certain medicines for seizures -certain medicines that treat or prevent blood clots like warfarin, enoxaparin, dalteparin, apixaban, dabigatran, and rivaroxaban -digoxin -hydrochlorothiazide -lithium -medicines for pain, sleep, or muscle relaxation -metformin -methazolamide -NSAIDS, medicines for pain and inflammation, like ibuprofen or naproxen -pioglitazone -risperidone This list may not describe all possible interactions. Give your health care provider a list of all the medicines, herbs, non-prescription drugs, or dietary supplements you use. Also tell them if you smoke, drink alcohol, or use illegal drugs. Some items may interact with your medicine. What should I watch for while using  this medicine? Visit your doctor or health care professional for regular checks on your progress. Do not stop taking this medicine suddenly. This increases the risk of seizures if you are using this medicine to  control epilepsy. Wear a medical identification bracelet or chain to say you have epilepsy or seizures, and carry a card that lists all your medicines. This medicine can decrease sweating and increase your body temperature. Watch for signs of deceased sweating or fever, especially in children. Avoid extreme heat, hot baths, and saunas. Be careful about exercising, especially in hot weather. Contact your health care provider right away if you notice a fever or decrease in sweating. You should drink plenty of fluids while taking this medicine. If you have had kidney stones in the past, this will help to reduce your chances of forming kidney stones. If you have stomach pain, with nausea or vomiting and yellowing of your eyes or skin, call your doctor immediately. You may get drowsy, dizzy, or have blurred vision. Do not drive, use machinery, or do anything that needs mental alertness until you know how this medicine affects you. To reduce dizziness, do not sit or stand up quickly, especially if you are an older patient. Alcohol can increase drowsiness and dizziness. Avoid alcoholic drinks. If you notice blurred vision, eye pain, or other eye problems, seek medical attention at once for an eye exam. The use of this medicine may increase the chance of suicidal thoughts or actions. Pay special attention to how you are responding while on this medicine. Any worsening of mood, or thoughts of suicide or dying should be reported to your health care professional right away. This medicine may increase the chance of developing metabolic acidosis. If left untreated, this can cause kidney stones, bone disease, or slowed growth in children. Symptoms include breathing fast, fatigue, loss of appetite, irregular heartbeat, or loss of consciousness. Call your doctor immediately if you experience any of these side effects. Also, tell your doctor about any surgery you plan on having while taking this medicine since this may  increase your risk for metabolic acidosis. Birth control pills may not work properly while you are taking this medicine. Talk to your doctor about using an extra method of birth control. Women who become pregnant while using this medicine may enroll in the [place] Pregnancy Registry by calling [phone]. This registry collects information about the safety of antiepileptic drug use during pregnancy. What side effects may I notice from receiving this medicine? Side effects that you should report to your doctor or health care professional as soon as possible: -allergic reactions like skin rash, itching or hives, swelling of the face, lips, or tongue -decreased sweating and/or rise in body temperature -depression -difficulty breathing, fast or irregular breathing patterns -difficulty speaking -difficulty walking or controlling muscle movements -hearing impairment -redness, blistering, peeling or loosening of the skin, including inside the mouth -tingling, pain or numbness in the hands or feet -unusual bleeding or bruising -unusually weak or tired -worsening of mood, thoughts or actions of suicide or dying Side effects that usually do not require medical attention (report to your doctor or health care professional if they continue or are bothersome): -altered taste -back pain, joint or muscle aches and pains -diarrhea, or constipation -headache -loss of appetite -nausea -stomach upset, indigestion -tremors This list may not describe all possible side effects. Call your doctor for medical advice about side effects. You may report side effects to FDA at 1-800-FDA-1088. Where should I  keep my medicine? Keep out of the reach of children. Store at room temperature between 15 and 30 degrees C (59 and 86 degrees F) in a tightly closed container. Protect from moisture. Throw away any unused medicine after the expiration date. NOTE: This sheet is a summary. It may not  cover all possible information. If you have questions about this medicine, talk to your doctor, pharmacist, or health care provider.  2017 Elsevier/Gold Standard (2013-08-01 23:17:57)
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
## 2016-09-19 ENCOUNTER — Other Ambulatory Visit: Payer: Self-pay | Admitting: Neurology

## 2016-09-19 DIAGNOSIS — D509 Iron deficiency anemia, unspecified: Secondary | ICD-10-CM | POA: Diagnosis not present

## 2016-09-19 DIAGNOSIS — N920 Excessive and frequent menstruation with regular cycle: Secondary | ICD-10-CM | POA: Diagnosis not present

## 2016-09-19 MED ORDER — ELETRIPTAN HYDROBROMIDE 40 MG PO TABS: 40.0000 mg | ORAL_TABLET | ORAL | 11 refills | Status: DC | PRN

## 2016-09-19 NOTE — Addendum Note (Signed)
Addended by: Monte Fantasia on: 09/19/2016 12:11 PM   Modules accepted: Orders

## 2016-09-19 NOTE — Addendum Note (Signed)
Addended by: Monte Fantasia on: 09/19/2016 09:12 AM   Modules accepted: Orders

## 2016-09-19 NOTE — Telephone Encounter (Signed)
New pt visit 09/15/16 for migraines. Topamax was increased to 75 mg. Relpax has been previously prescribed by PCP.

## 2016-09-19 NOTE — Telephone Encounter (Signed)
Patient called requesting refill for eletriptan (RELPAX) 40 MG tablet.  States at her last visit it was to be filled.  Please call

## 2016-09-19 NOTE — Telephone Encounter (Signed)
Pt called wanted to pick up med at lunch between 12-1 if possible. Could RN call her when [facility] has been [facility] to [facility]. Pt did comment that PCP gives her relpax also.

## 2016-09-26 ENCOUNTER — Encounter: Payer: Self-pay | Admitting: Neurology

## 2016-10-03 DIAGNOSIS — N924 Excessive bleeding in the premenopausal period: Secondary | ICD-10-CM | POA: Diagnosis not present

## 2016-10-06 ENCOUNTER — Telehealth: Payer: Self-pay | Admitting: Neurology

## 2016-10-06 NOTE — Telephone Encounter (Signed)
Looks like we just increased it to 75 last time she was here, it may take 4-6 weeks to see full effect so as long as she is stable we can wait. If migraines worsening we would have to change to ER formulation or change meds. See if she will wait to see if the increase we made is helping thanks

## 2016-10-06 NOTE — Telephone Encounter (Signed)
Patient called office in reference to continuing to have tingling/numbness in her fingers.  Per patient her toes and mouth are tingling now and only has 1 eletriptan (RELPAX) 40 MG tablet left due to migraines, but not due for a refill until March 9th.  Please call

## 2016-10-06 NOTE — Telephone Encounter (Signed)
New rx for Relpax #10 e-scribed 09/19/16, refill not due before 10/15/16.

## 2016-10-06 NOTE — Telephone Encounter (Signed)
Please contact patients mobile number is after 5:00pm.

## 2016-10-07 NOTE — Telephone Encounter (Signed)
Talked w/ pt. She has been on Topamax 75 mg x 3 weeks. She continues to have side effects of numbness/tingling in fingers/toes and is not interested in higher dose. Also c/o HA daily, at least 5-6 per week, often starting early in the morning (around 4 am). She has used all of her Relpax w/ next refill not due until 10/15/16. Would like to discuss different med regimen and/or steroid dosepack.

## 2016-10-08 ENCOUNTER — Other Ambulatory Visit: Payer: Self-pay | Admitting: Neurology

## 2016-10-08 DIAGNOSIS — G43001 Migraine without aura, not intractable, with status migrainosus: Secondary | ICD-10-CM

## 2016-10-08 MED ORDER — METHYLPREDNISOLONE 4 MG PO TBPK: ORAL_TABLET | ORAL | 1 refills | Status: DC

## 2016-10-08 MED ORDER — TOPIRAMATE ER 100 MG PO SPRINKLE CAP24: 100.0000 mg | EXTENDED_RELEASE_CAPSULE | Freq: Every day | ORAL | 12 refills | Status: DC

## 2016-10-08 NOTE — Telephone Encounter (Signed)
I already spoke to her thanks!!

## 2016-10-08 NOTE — Telephone Encounter (Signed)
In addition to previous msg, pt became teary eyed, said she is in pain and feels like she has been dropped by the wayside. FYI

## 2016-10-08 NOTE — Telephone Encounter (Signed)
Advised to go back to 50 mg a day. Will call in a medrol dosepack. Call in Topiramate ER.

## 2016-10-08 NOTE — Telephone Encounter (Signed)
Pt called back today, very irritated she has not had a return call. Said she is out of medication and in pain. Please call

## 2016-10-09 ENCOUNTER — Telehealth: Payer: Self-pay | Admitting: Neurology

## 2016-10-09 MED ORDER — TOPIRAMATE ER 100 MG PO CAP24: 100.0000 mg | ORAL_CAPSULE | Freq: Every day | ORAL | 11 refills | Status: DC

## 2016-10-09 NOTE — Telephone Encounter (Signed)
Patient calling to discuss methylPREDNISolone (MEDROL DOSEPAK) 4 MG TBPK tablet. Since taking she feels jumpy, loopy and her heart feels like it is pounding. She is at work so please leave a message.

## 2016-10-09 NOTE — Addendum Note (Signed)
Addended by: Monte Fantasia on: 10/09/2016 11:57 AM   Modules accepted: Orders

## 2016-10-09 NOTE — Telephone Encounter (Signed)
[facility] [address] [facility] and could not get Rx. She says it cost $500 but it was not generic. Please call pharmacy and discuss Topiramate ER (QUDEXY XR) 100 MG CS24 with them. Patient wants to get generic.

## 2016-10-09 NOTE — Telephone Encounter (Signed)
Rx re-sent to pharmacy for generic. Notified pt via TC.

## 2016-10-09 NOTE — Telephone Encounter (Signed)
Discussed w/ Dr. Jaynee Eagles. Called pt and let her know that she is having common side effects when taking medrol dosepak. Advised to complete course of steroids per MD. If symptoms worsen or do not improve, she may call back tomorrow before 12 or go to Urgent Care over the weekend.

## 2016-10-15 ENCOUNTER — Encounter (HOSPITAL_BASED_OUTPATIENT_CLINIC_OR_DEPARTMENT_OTHER): Payer: Self-pay | Admitting: *Deleted

## 2016-10-15 NOTE — Progress Notes (Signed)
NPO AFTER MN.  ARRIVE AT 0600.  GETTING LAB WORK DONE Thursday 10-16-2016 OR Friday 10-17-2016. (CBC, hCG SERUM).  WILL TAKE SYNTHROID AM DOS W/ SIPS OF WATER.

## 2016-10-16 DIAGNOSIS — E039 Hypothyroidism, unspecified: Secondary | ICD-10-CM | POA: Diagnosis not present

## 2016-10-16 DIAGNOSIS — Z791 Long term (current) use of non-steroidal anti-inflammatories (NSAID): Secondary | ICD-10-CM | POA: Diagnosis not present

## 2016-10-16 DIAGNOSIS — Z8042 Family history of malignant neoplasm of prostate: Secondary | ICD-10-CM | POA: Diagnosis not present

## 2016-10-16 DIAGNOSIS — Z8249 Family history of ischemic heart disease and other diseases of the circulatory system: Secondary | ICD-10-CM | POA: Diagnosis not present

## 2016-10-16 DIAGNOSIS — Z9889 Other specified postprocedural states: Secondary | ICD-10-CM | POA: Diagnosis not present

## 2016-10-16 DIAGNOSIS — Z87442 Personal history of urinary calculi: Secondary | ICD-10-CM | POA: Diagnosis not present

## 2016-10-16 DIAGNOSIS — N858 Other specified noninflammatory disorders of uterus: Secondary | ICD-10-CM | POA: Diagnosis not present

## 2016-10-16 DIAGNOSIS — Z800 Family history of malignant neoplasm of digestive organs: Secondary | ICD-10-CM | POA: Diagnosis not present

## 2016-10-16 DIAGNOSIS — Z79899 Other long term (current) drug therapy: Secondary | ICD-10-CM | POA: Diagnosis not present

## 2016-10-16 DIAGNOSIS — Z820 Family history of epilepsy and other diseases of the nervous system: Secondary | ICD-10-CM | POA: Diagnosis not present

## 2016-10-16 DIAGNOSIS — G43909 Migraine, unspecified, not intractable, without status migrainosus: Secondary | ICD-10-CM | POA: Diagnosis not present

## 2016-10-16 DIAGNOSIS — N920 Excessive and frequent menstruation with regular cycle: Secondary | ICD-10-CM | POA: Diagnosis not present

## 2016-10-16 LAB — HCG, SERUM, QUALITATIVE: PREG SERUM: NEGATIVE

## 2016-10-16 LAB — CBC
HEMATOCRIT: 37.6 % (ref 36.0–46.0)
Hemoglobin: 12.4 g/dL (ref 12.0–15.0)
MCH: 30.6 pg (ref 26.0–34.0)
MCHC: 33 g/dL (ref 30.0–36.0)
MCV: 92.8 fL (ref 78.0–100.0)
PLATELETS: 356 10*3/uL (ref 150–400)
RBC: 4.05 MIL/uL (ref 3.87–5.11)
RDW: 14.4 % (ref 11.5–15.5)
WBC: 10 10*3/uL (ref 4.0–10.5)

## 2016-10-22 NOTE — Anesthesia Preprocedure Evaluation (Addendum)
Anesthesia Evaluation  Patient identified by MRN, date of birth, ID band Patient awake    Reviewed: Allergy & Precautions, NPO status , Patient's Chart, lab work & pertinent test results  Airway Mallampati: II  TM Distance: >3 FB Neck ROM: Full    Dental  (+) Dental Advisory Given   Pulmonary neg pulmonary ROS,    breath sounds clear to auscultation       Cardiovascular negative cardio ROS   Rhythm:Regular Rate:Normal     Neuro/Psych  Headaches,    GI/Hepatic negative GI ROS, Neg liver ROS,   Endo/Other  Hypothyroidism   Renal/GU negative Renal ROS     Musculoskeletal   Abdominal   Peds  Hematology negative hematology ROS (+)   Anesthesia Other Findings   Reproductive/Obstetrics                            Lab Results  Component Value Date   WBC 10.0 10/16/2016   HGB 12.4 10/16/2016   HCT 37.6 10/16/2016   MCV 92.8 10/16/2016   PLT 356 10/16/2016   Lab Results  Component Value Date   CREATININE 0.91 10/28/2013   BUN 19 10/28/2013   NA 139 10/28/2013   K 4.3 10/28/2013   CL 102 10/28/2013   CO2 23 10/28/2013    Anesthesia Physical Anesthesia Plan  ASA: II  Anesthesia Plan: General   Post-op Pain Management:    Induction: Intravenous  Airway Management Planned: LMA  Additional Equipment:   Intra-op Plan:   Post-operative Plan: Extubation in OR  Informed Consent: I have reviewed the patients History and Physical, chart, labs and discussed the procedure including the risks, benefits and alternatives for the proposed anesthesia with the patient or authorized representative who has indicated his/her understanding and acceptance.   Dental advisory given  Plan Discussed with:   Anesthesia Plan Comments:        Anesthesia Quick Evaluation

## 2016-10-22 NOTE — H&P (Signed)
[name] is an 51 y.o. female with heavy, irregular bleeding. U/S in office is suspicious for a flat endometrial polyp measuring 14 mm.  Pertinent Gynecological History: Menses: flow is excessive with use of many pads or tampons on heaviest days Bleeding: dysfunctional uterine bleeding Contraception: abstinence DES exposure: denies Blood transfusions: none Sexually transmitted diseases: no past history Previous GYN Procedures: DNC  Last mammogram: normal Date: 2018 Last pap: normal Date: 2017 OB History: G2, P0   Menstrual History: Menarche age: [name] Patient's last menstrual period was 10/15/2016 (approximate).    Past Medical History:  Diagnosis Date  . History of ectopic pregnancy    04/ 2007  S/P LEFT SALPINGECTOMY  . History of kidney stones   . Hypothyroidism   . Menorrhagia   . Migraine     Past Surgical History:  Procedure Laterality Date  . DILATATION & CURETTAGE/HYSTEROSCOPY WITH MYOSURE  2016  . DILATION AND EVACUATION  10/07/2007   retained poc  . LAPAROSCOPIC UNILATERAL SALPINGECTOMY Left 11/24/2005    Family History  Problem Relation Age of Onset  . Hypertension Mother   . Aortic aneurysm Mother   . Liver cancer Father   . Prostate cancer Brother   . Migraines Sister     Social History:  reports that she has never smoked. She has never used smokeless tobacco. She reports that she does not drink alcohol or use drugs.  Allergies: No Known Allergies  No prescriptions prior to admission.    ROS  Height 5\' 6"  (1.676 m), weight 160 lb (72.6 kg), last menstrual period 10/15/2016. Physical Exam  Cardiovascular: Normal rate and regular rhythm.   Respiratory: Effort normal and breath sounds normal.  GI: Soft. There is no tenderness.  Genitourinary: Vagina normal.  Genitourinary Comments: Uterus mobile, NT Adnexa NT without masses  Neurological: She has normal reflexes.    No results found for this or any previous visit (from the past 24  hour(s)).  No results found.  Assessment/Plan: 51 yo G2P0 with menometorrahgia and U/S suspicious for recurrent endometrial polyp D/W patient H/S, D&C with Myosure D/W risks including infection, organ damage, bleeding/transfusion-HIV/Hep, DVT/PE, pneumonia.  [name],[name] E 10/22/2016, 5:45 PM

## 2016-10-23 ENCOUNTER — Ambulatory Visit (HOSPITAL_BASED_OUTPATIENT_CLINIC_OR_DEPARTMENT_OTHER): Payer: 59 | Admitting: Anesthesiology

## 2016-10-23 ENCOUNTER — Encounter (HOSPITAL_BASED_OUTPATIENT_CLINIC_OR_DEPARTMENT_OTHER)
Admission: RE | Disposition: A | Discharge status: Home / Self Care | Payer: Self-pay | Source: Ambulatory Visit | Attending: Obstetrics and Gynecology

## 2016-10-23 ENCOUNTER — Encounter (HOSPITAL_BASED_OUTPATIENT_CLINIC_OR_DEPARTMENT_OTHER): Payer: Self-pay

## 2016-10-23 ENCOUNTER — Ambulatory Visit (HOSPITAL_BASED_OUTPATIENT_CLINIC_OR_DEPARTMENT_OTHER)
Admission: RE | Admit: 2016-10-23 | Discharge: 2016-10-23 | Disposition: A | Discharge status: Home / Self Care | Payer: 59 | Source: Ambulatory Visit | Attending: Obstetrics and Gynecology | Admitting: Obstetrics and Gynecology

## 2016-10-23 DIAGNOSIS — Z800 Family history of malignant neoplasm of digestive organs: Secondary | ICD-10-CM | POA: Insufficient documentation

## 2016-10-23 DIAGNOSIS — Z8042 Family history of malignant neoplasm of prostate: Secondary | ICD-10-CM | POA: Diagnosis not present

## 2016-10-23 DIAGNOSIS — Z820 Family history of epilepsy and other diseases of the nervous system: Secondary | ICD-10-CM | POA: Insufficient documentation

## 2016-10-23 DIAGNOSIS — Z8249 Family history of ischemic heart disease and other diseases of the circulatory system: Secondary | ICD-10-CM | POA: Insufficient documentation

## 2016-10-23 DIAGNOSIS — E039 Hypothyroidism, unspecified: Secondary | ICD-10-CM | POA: Diagnosis not present

## 2016-10-23 DIAGNOSIS — N920 Excessive and frequent menstruation with regular cycle: Secondary | ICD-10-CM | POA: Insufficient documentation

## 2016-10-23 DIAGNOSIS — Z9889 Other specified postprocedural states: Secondary | ICD-10-CM | POA: Insufficient documentation

## 2016-10-23 DIAGNOSIS — N858 Other specified noninflammatory disorders of uterus: Secondary | ICD-10-CM | POA: Diagnosis not present

## 2016-10-23 DIAGNOSIS — Z87442 Personal history of urinary calculi: Secondary | ICD-10-CM | POA: Diagnosis not present

## 2016-10-23 DIAGNOSIS — G43909 Migraine, unspecified, not intractable, without status migrainosus: Secondary | ICD-10-CM | POA: Diagnosis not present

## 2016-10-23 DIAGNOSIS — Z79899 Other long term (current) drug therapy: Secondary | ICD-10-CM | POA: Insufficient documentation

## 2016-10-23 DIAGNOSIS — Z791 Long term (current) use of non-steroidal anti-inflammatories (NSAID): Secondary | ICD-10-CM | POA: Insufficient documentation

## 2016-10-23 DIAGNOSIS — N921 Excessive and frequent menstruation with irregular cycle: Secondary | ICD-10-CM

## 2016-10-23 HISTORY — DX: Excessive and frequent menstruation with regular cycle: N92.0

## 2016-10-23 HISTORY — DX: Personal history of urinary calculi: Z87.442

## 2016-10-23 HISTORY — DX: Personal history of other complications of pregnancy, childbirth and the puerperium: Z87.59

## 2016-10-23 HISTORY — PX: DILATATION & CURETTAGE/HYSTEROSCOPY WITH MYOSURE: SHX6511

## 2016-10-23 HISTORY — DX: Hypothyroidism, unspecified: E03.9

## 2016-10-23 LAB — ABO/RH: ABO/RH(D): O POS

## 2016-10-23 LAB — TYPE AND SCREEN
ABO/RH(D): O POS
Antibody Screen: NEGATIVE

## 2016-10-23 SURGERY — DILATATION & CURETTAGE/HYSTEROSCOPY WITH MYOSURE
Anesthesia: General | Site: Uterus

## 2016-10-23 MED ORDER — LIDOCAINE 2% (20 MG/ML) 5 ML SYRINGE
INTRAMUSCULAR | Status: DC | PRN
  Administered 2016-10-23: 100 mg via INTRAVENOUS

## 2016-10-23 MED ORDER — PROMETHAZINE HCL 25 MG/ML IJ SOLN
6.2500 mg | INTRAMUSCULAR | Status: DC | PRN
  Filled 2016-10-23: qty 1

## 2016-10-23 MED ORDER — FENTANYL CITRATE (PF) 100 MCG/2ML IJ SOLN
INTRAMUSCULAR | Status: AC
  Filled 2016-10-23: qty 2

## 2016-10-23 MED ORDER — SODIUM CHLORIDE 0.9 % IR SOLN
Status: DC | PRN
  Administered 2016-10-23: 3000 mL

## 2016-10-23 MED ORDER — OXYCODONE-ACETAMINOPHEN 5-325 MG PO TABS
1.0000 | ORAL_TABLET | Freq: Four times a day (QID) | ORAL | Status: AC | PRN
  Administered 2016-10-23: 1 via ORAL
  Filled 2016-10-23: qty 1

## 2016-10-23 MED ORDER — CEFAZOLIN SODIUM-DEXTROSE 2-4 GM/100ML-% IV SOLN
INTRAVENOUS | Status: AC
  Filled 2016-10-23: qty 100

## 2016-10-23 MED ORDER — LACTATED RINGERS IV SOLN
INTRAVENOUS | Status: DC
  Filled 2016-10-23: qty 1000

## 2016-10-23 MED ORDER — LACTATED RINGERS IV SOLN
INTRAVENOUS | Status: DC
  Administered 2016-10-23: 07:00:00 via INTRAVENOUS
  Filled 2016-10-23: qty 1000

## 2016-10-23 MED ORDER — PROPOFOL 10 MG/ML IV BOLUS
INTRAVENOUS | Status: DC | PRN
  Administered 2016-10-23: 200 mg via INTRAVENOUS

## 2016-10-23 MED ORDER — ONDANSETRON HCL 4 MG/2ML IJ SOLN
INTRAMUSCULAR | Status: AC
  Filled 2016-10-23: qty 2

## 2016-10-23 MED ORDER — SCOPOLAMINE 1 MG/3DAYS TD PT72
MEDICATED_PATCH | TRANSDERMAL | Status: AC
  Filled 2016-10-23: qty 1

## 2016-10-23 MED ORDER — CEFAZOLIN SODIUM-DEXTROSE 2-4 GM/100ML-% IV SOLN
2.0000 g | INTRAVENOUS | Status: AC
  Administered 2016-10-23: 2 g via INTRAVENOUS
  Filled 2016-10-23: qty 100

## 2016-10-23 MED ORDER — PROPOFOL 10 MG/ML IV BOLUS
INTRAVENOUS | Status: AC
  Filled 2016-10-23: qty 40

## 2016-10-23 MED ORDER — DEXAMETHASONE SODIUM PHOSPHATE 10 MG/ML IJ SOLN
INTRAMUSCULAR | Status: AC
  Filled 2016-10-23: qty 1

## 2016-10-23 MED ORDER — FENTANYL CITRATE (PF) 100 MCG/2ML IJ SOLN
INTRAMUSCULAR | Status: DC | PRN
  Administered 2016-10-23: 50 ug via INTRAVENOUS

## 2016-10-23 MED ORDER — KETOROLAC TROMETHAMINE 30 MG/ML IJ SOLN
INTRAMUSCULAR | Status: AC
  Filled 2016-10-23: qty 1

## 2016-10-23 MED ORDER — SCOPOLAMINE 1 MG/3DAYS TD PT72
1.0000 | MEDICATED_PATCH | TRANSDERMAL | Status: DC
  Administered 2016-10-23: 1.5 mg via TRANSDERMAL
  Filled 2016-10-23: qty 1

## 2016-10-23 MED ORDER — LIDOCAINE 2% (20 MG/ML) 5 ML SYRINGE
INTRAMUSCULAR | Status: AC
  Filled 2016-10-23: qty 5

## 2016-10-23 MED ORDER — ONDANSETRON HCL 4 MG/2ML IJ SOLN
INTRAMUSCULAR | Status: DC | PRN
  Administered 2016-10-23: 4 mg via INTRAVENOUS

## 2016-10-23 MED ORDER — HYDROMORPHONE HCL 1 MG/ML IJ SOLN
0.2500 mg | INTRAMUSCULAR | Status: DC | PRN
  Filled 2016-10-23: qty 0.5

## 2016-10-23 MED ORDER — DEXAMETHASONE SODIUM PHOSPHATE 4 MG/ML IJ SOLN
INTRAMUSCULAR | Status: DC | PRN
  Administered 2016-10-23: 10 mg via INTRAVENOUS

## 2016-10-23 MED ORDER — OXYCODONE-ACETAMINOPHEN 5-325 MG PO TABS
ORAL_TABLET | ORAL | Status: AC
  Filled 2016-10-23: qty 1

## 2016-10-23 MED ORDER — KETOROLAC TROMETHAMINE 30 MG/ML IJ SOLN
INTRAMUSCULAR | Status: DC | PRN
  Administered 2016-10-23: 30 mg via INTRAVENOUS

## 2016-10-23 MED ORDER — MIDAZOLAM HCL 5 MG/5ML IJ SOLN
INTRAMUSCULAR | Status: DC | PRN
  Administered 2016-10-23: 2 mg via INTRAVENOUS

## 2016-10-23 MED ORDER — MIDAZOLAM HCL 2 MG/2ML IJ SOLN
INTRAMUSCULAR | Status: AC
  Filled 2016-10-23: qty 2

## 2016-10-23 MED ORDER — LIDOCAINE HCL 1 % IJ SOLN
INTRAMUSCULAR | Status: DC | PRN
  Administered 2016-10-23: 20 mL

## 2016-10-23 MED FILL — OXYCODONE/APAP 5/325 MG TAB: 5-325 | 2 days supply | Qty: 10 | Fill #0

## 2016-10-23 SURGICAL SUPPLY — 30 items
CATH ROBINSON RED A/P 16FR (CATHETERS) ×2 IMPLANT
COVER BACK TABLE 60X90IN (DRAPES) ×4 IMPLANT
DEVICE MYOSURE LITE (MISCELLANEOUS) IMPLANT
DEVICE MYOSURE REACH (MISCELLANEOUS) IMPLANT
DILATOR CANAL MILEX (MISCELLANEOUS) IMPLANT
DRAPE LG THREE QUARTER DISP (DRAPES) ×2 IMPLANT
DRSG TELFA 3X8 NADH (GAUZE/BANDAGES/DRESSINGS) ×2 IMPLANT
ELECT REM PT RETURN 9FT ADLT (ELECTROSURGICAL)
ELECTRODE REM PT RTRN 9FT ADLT (ELECTROSURGICAL) IMPLANT
FILTER ARTHROSCOPY CONVERTOR (FILTER) ×2 IMPLANT
GLOVE BIO SURGEON STRL SZ7.5 (GLOVE) ×2 IMPLANT
GLOVE BIOGEL PI IND STRL 8 (GLOVE) ×2 IMPLANT
GLOVE BIOGEL PI INDICATOR 8 (GLOVE) ×2
GOWN STRL REUS W/TWL LRG LVL3 (GOWN DISPOSABLE) ×4 IMPLANT
KIT RM TURNOVER CYSTO AR (KITS) ×2 IMPLANT
LEGGING LITHOTOMY PAIR STRL (DRAPES) ×2 IMPLANT
MYOSURE XL FIBROID REM (MISCELLANEOUS)
NDL SPNL 22GX3.5 QUINCKE BK (NEEDLE) ×1 IMPLANT
NEEDLE SPNL 22GX3.5 QUINCKE BK (NEEDLE) ×2 IMPLANT
PACK BASIN DAY SURGERY FS (CUSTOM PROCEDURE TRAY) ×2 IMPLANT
PAD DRESSING TELFA 3X8 NADH (GAUZE/BANDAGES/DRESSINGS) ×1 IMPLANT
PAD OB MATERNITY 4.3X12.25 (PERSONAL CARE ITEMS) ×2 IMPLANT
PAD PREP 24X48 CUFFED NSTRL (MISCELLANEOUS) ×2 IMPLANT
SEAL ROD LENS SCOPE MYOSURE (ABLATOR) ×2 IMPLANT
SYR CONTROL 10ML LL (SYRINGE) ×2 IMPLANT
SYSTEM TISS REMOVAL MYSR XL RM (MISCELLANEOUS) IMPLANT
TOWEL OR 17X24 6PK STRL BLUE (TOWEL DISPOSABLE) ×4 IMPLANT
TUBE CONNECTING 12X1/4 (SUCTIONS) IMPLANT
TUBING AQUILEX INFLOW (TUBING) ×2 IMPLANT
TUBING AQUILEX OUTFLOW (TUBING) ×2 IMPLANT

## 2016-10-23 NOTE — Progress Notes (Signed)
No changes to H&P per patient history Reviewed with patient procedure-H/S, D&C, Myosure Patient states she understands and agrees 

## 2016-10-23 NOTE — Anesthesia Postprocedure Evaluation (Signed)
Anesthesia Post Note  Patient: [name]  Procedure(s) Performed: Procedure(s) (LRB): DILATATION & CURETTAGE/HYSTEROSCOPY (N/A)  Patient location during evaluation: PACU Anesthesia Type: General Level of consciousness: awake and alert Pain management: pain level controlled Vital Signs Assessment: post-procedure vital signs reviewed and stable Respiratory status: spontaneous breathing, nonlabored ventilation, respiratory function stable and patient connected to nasal cannula oxygen Cardiovascular status: blood pressure returned to baseline and stable Postop Assessment: no signs of nausea or vomiting Anesthetic complications: no       Last Vitals:  Vitals:   10/23/16 0915 10/23/16 1000  BP:  113/63  Pulse:  83  Resp: 18   Temp:  36.9 C    Last Pain:  Vitals:   10/23/16 1000  TempSrc:   PainSc: 0-No pain                 Tiajuana Amass

## 2016-10-23 NOTE — Progress Notes (Signed)
10/23/2016  8:06 AM  PATIENT:  [name]  51 y.o. female  PRE-OPERATIVE DIAGNOSIS:  MENORRHAGIA  POST-OPERATIVE DIAGNOSIS:  MENORRHAGIA  PROCEDURE:  Procedure(s): DILATATION & CURETTAGE/HYSTEROSCOPY (N/A)  SURGEON:  Surgeon(s) and Role:    * Everlene Farrier, MD - Primary  PHYSICIAN ASSISTANT:   ASSISTANTS: none   ANESTHESIA:   IV sedation  EBL:  Total I/O In: 100 [I.V.:100] Out: 10 [Blood:10]  BLOOD ADMINISTERED:none  DRAINS: none   LOCAL MEDICATIONS USED:  LIDOCAINE  and Amount: 20 ml  SPECIMEN:  Source of Specimen:  endometrial currettings  DISPOSITION OF SPECIMEN:  PATHOLOGY  COUNTS:  YES  TOURNIQUET:  * No tourniquets in log *  DICTATION: .Other Dictation: Dictation Number [id number]  PLAN OF CARE: Discharge to home after PACU  PATIENT DISPOSITION:  PACU - hemodynamically stable.   Delay start of Pharmacological VTE agent (>24hrs) due to surgical blood loss or risk of bleeding: not applicable

## 2016-10-23 NOTE — Anesthesia Procedure Notes (Signed)
Procedure Name: LMA Insertion Date/Time: 10/23/2016 7:44 AM Performed by: Bethena Roys T Pre-anesthesia Checklist: Patient identified, Emergency Drugs available, Suction available and Patient being monitored Patient Re-evaluated:Patient Re-evaluated prior to inductionOxygen Delivery Method: Circle system utilized Preoxygenation: Pre-oxygenation with 100% oxygen Intubation Type: IV induction Ventilation: Mask ventilation without difficulty LMA: LMA inserted LMA Size: 4.0 Number of attempts: 1 Airway Equipment and Method: Bite block Placement Confirmation: positive ETCO2 Tube secured with: Tape Dental Injury: Teeth and Oropharynx as per pre-operative assessment

## 2016-10-23 NOTE — Discharge Summary (Signed)
Admin LVD:[id number] D/C WYB:[id number] Procedure: H/S, D&C FU: Office 1-2 weeks Meds: Ibuprofen 600mg  po q6 hrs prn            Percocet 5/325, #10, 1-2 po q 6 hrs prn

## 2016-10-23 NOTE — Discharge Instructions (Signed)

## 2016-10-23 NOTE — Transfer of Care (Signed)
Immediate Anesthesia Transfer of Care Note  Patient: [name]  Procedure(s) Performed: Procedure(s): DILATATION & CURETTAGE/HYSTEROSCOPY (N/A)  Patient Location: PACU  Anesthesia Type:General  Level of Consciousness: sedated and responds to stimulation  Airway & Oxygen Therapy: Patient Spontanous Breathing and Patient connected to nasal cannula oxygen  Post-op Assessment: Report given to RN  Post vital signs: Reviewed and stable  Last Vitals:  Vitals:   10/23/16 0604 10/23/16 0817  BP: 121/76 (P) 108/66  Pulse: 94   Resp: 16 (P) 18  Temp: 36.5 C (P) 36.4 C    Last Pain:  Vitals:   10/23/16 0604  TempSrc: Oral      Patients Stated Pain Goal: 7 (10/29/20 4825)  Complications: No apparent anesthesia complications

## 2016-10-24 ENCOUNTER — Encounter (HOSPITAL_BASED_OUTPATIENT_CLINIC_OR_DEPARTMENT_OTHER): Payer: Self-pay | Admitting: Obstetrics and Gynecology

## 2016-10-24 NOTE — Op Note (Signed)
NAME:  [name], [name]                     ACCOUNT NO.:  MEDICAL RECORD NO.:  [id number]  LOCATION:                                 FACILITY:  PHYSICIAN:  Daleen Bo. Gaetano Net, M.D. DATE OF BIRTH:  [birth year]  DATE OF PROCEDURE:  10/23/2016 DATE OF DISCHARGE:                              OPERATIVE REPORT   LOCATION:  [place], [facility], [place], [place].  PREOPERATIVE DIAGNOSIS:  Menorrhagia.  POSTOPERATIVE DIAGNOSIS:  Menorrhagia.  PROCEDURE:  Hysteroscopy, dilation and curettage.  SURGEON:  Daleen Bo. Gaetano Net, M.D.  ANESTHESIA:  IV sedation.  ESTIMATED BLOOD LOSS:  Less than 100 mL.  SPECIMENS:  Endometrial curettings to Pathology.  I and O's of distention medium 30 mL deficit.  INDICATIONS AND CONSENT:  This patient is a 51 year old patient, who has recurrent heavy irregular menses.  Ultrasound was suggestive of an endometrial polyp.  Hysteroscopy, D and C, possible MyoSure are discussed preoperatively.  Potential risks and complications were reviewed preoperatively including, but not limited to infection, uterine perforation, organ damage, bleeding requiring transfusion of blood products with HIV and hepatitis acquisition, DVT, PE, pneumonia.  The patient states she understands and agrees.  Consent was signed on the chart.  FINDINGS:  Both fallopian tube and ostia are identified.  There is a pedunculated 1 cm polypoid mass in the right lower uterine segment.  DESCRIPTION OF PROCEDURE:  The patient was taken to the operating room, where she was placed in a dorsal supine position and she was given sedation.  She was placed in a dorsal lithotomy position.  Time-out undertaken.  She was prepped with Betadine.  Bladder straight catheterized and draped in a sterile fashion.  Bivalve speculum was placed in the vagina.  Anterior cervical lip was injected with 1% plain lidocaine and grasped with single-tooth tenaculum.  Paracervical  block was placed at 2, 4, 5, 7, 8, and 10 o'clock positions with approximately 20 mL of the same solution.  Cervix was gently progressively dilated. Hysteroscope was placed in the endocervical canal and advanced under direct visualization using distending media.  The above findings were noted.  Hysteroscope was withdrawn and sharp curettage was carried out.  Inspection revealed the cavity to be clean.  Final pass of the curette was gently done and all the tissue was sent to Pathology.  Instruments were removed.  Good hemostasis was noted and all counts were correct.  The patient was awakened and taken to recovery room in stable condition.     Daleen Bo Gaetano Net, M.D.     JET/MEDQ  D:  10/23/2016  T:  10/23/2016  Job:  [id number]

## 2016-10-27 ENCOUNTER — Telehealth: Payer: Self-pay | Admitting: Neurology

## 2016-10-27 NOTE — Telephone Encounter (Signed)
New patient seen 09/15/16 for migraines. Only tolerated increasing topiramate IR to 75 mg, was switched to 100 mg ER around 10/08/16. Recently completed medrol dosepak. She also has Relpax ordered prn. Continues to c/o numbness/tingling in hands/feet.

## 2016-10-27 NOTE — Telephone Encounter (Signed)
Patient called office in reference to Topiramate ER 100 MG CP24.  Patient states she is still having the tingling in her fingers, feet and now in her heels.  Patient would like to know when this is suppose to stop or if it will.  Please call

## 2016-10-28 NOTE — Telephone Encounter (Signed)
If it doesn't get better in a week we can change medications. Please discuss with her thanks

## 2016-10-28 NOTE — Telephone Encounter (Signed)
Called pt and relayed Dr. Cathren Laine recommendations to try medication for another week. May call back next week if she would like to try a different medication.

## 2016-10-29 NOTE — Telephone Encounter (Signed)
Pt agreed to try topiramate ER for another week to see if side effects improve. She last filled Relpax on 10/15/16.

## 2016-10-29 NOTE — Telephone Encounter (Signed)
Patient called office in reference to not being able to pick up Relpax until 11/15/16.  Patient states she is continuing to have migraines daily and would like to see if something else can be called in in the mean time.  Patient did mention Imitrex.  Pharmacy- [facility].  Please call patient [phone] until 5 (work number)

## 2016-10-31 MED ORDER — SUMATRIPTAN SUCCINATE 100 MG PO TABS: 100.0000 mg | ORAL_TABLET | Freq: Once | ORAL | 5 refills | Status: DC | PRN

## 2016-10-31 MED FILL — SUMATRIPTAN SUCC 100 MG TAB: 100 | 28 days supply | Qty: 10 | Fill #0

## 2016-10-31 NOTE — Telephone Encounter (Signed)
Discussed w/ Dr. Jaynee Eagles who agreed to rx for Imitrex which was e-scribed to [facility]. Pt notified via TC.

## 2016-10-31 NOTE — Addendum Note (Signed)
Addended by: Monte Fantasia on: 10/31/2016 01:55 PM   Modules accepted: Orders

## 2016-10-31 NOTE — Telephone Encounter (Signed)
Pt called said she has not heard back reg mdicaitons. RN skyped, she will call pt back when they are done with pt's today. Pt advised to please call after 1pm on work number.

## 2016-11-04 DIAGNOSIS — Z0000 Encounter for general adult medical examination without abnormal findings: Secondary | ICD-10-CM | POA: Diagnosis not present

## 2016-11-05 MED FILL — TOPIRAMATE ER 100 MG CAP: 100 | 30 days supply | Qty: 30 | Fill #0

## 2016-11-07 MED FILL — ELETRIPTAN HBR 40 MG TABLET: 40 | 30 days supply | Qty: 9 | Fill #7

## 2016-11-10 DIAGNOSIS — K599 Functional intestinal disorder, unspecified: Secondary | ICD-10-CM | POA: Diagnosis not present

## 2016-11-10 DIAGNOSIS — G43009 Migraine without aura, not intractable, without status migrainosus: Secondary | ICD-10-CM | POA: Diagnosis not present

## 2016-11-10 DIAGNOSIS — Z0000 Encounter for general adult medical examination without abnormal findings: Secondary | ICD-10-CM | POA: Diagnosis not present

## 2016-11-10 MED FILL — AMITRIPTYLINE HCL 10 MG TAB: 10 | 30 days supply | Qty: 30 | Fill #0

## 2016-11-10 MED FILL — LEVOTHYROXINE 88 MCG TABLET: 88 | 90 days supply | Qty: 90 | Fill #0

## 2016-11-11 ENCOUNTER — Encounter: Payer: Self-pay | Admitting: Neurology

## 2016-12-01 MED FILL — TOPIRAMATE ER 100 MG CAP: 100 | 30 days supply | Qty: 30 | Fill #1

## 2016-12-01 MED FILL — ELETRIPTAN HBR 40 MG TABLET: 40 | 30 days supply | Qty: 9 | Fill #0

## 2016-12-08 MED FILL — AMITRIPTYLINE HCL 10 MG TAB: 10 | 30 days supply | Qty: 30 | Fill #1

## 2016-12-25 MED FILL — ELETRIPTAN HBR 40 MG TABLET: 40 | 30 days supply | Qty: 9 | Fill #1

## 2017-01-01 MED FILL — AMITRIPTYLINE HCL 10 MG TAB: 10 | 30 days supply | Qty: 30 | Fill #0

## 2017-01-06 MED FILL — TOPIRAMATE ER 100 MG CAP: 100 | 30 days supply | Qty: 30 | Fill #2

## 2017-01-15 ENCOUNTER — Ambulatory Visit: Payer: 59 | Admitting: Neurology

## 2017-01-20 ENCOUNTER — Ambulatory Visit (INDEPENDENT_AMBULATORY_CARE_PROVIDER_SITE_OTHER): Payer: 59 | Admitting: Neurology

## 2017-01-20 ENCOUNTER — Encounter: Payer: Self-pay | Admitting: Neurology

## 2017-01-20 VITALS — BP 120/60 | HR 84 | Ht 67.0 in | Wt 177.0 lb

## 2017-01-20 DIAGNOSIS — G43709 Chronic migraine without aura, not intractable, without status migrainosus: Secondary | ICD-10-CM | POA: Diagnosis not present

## 2017-01-20 MED ORDER — NORTRIPTYLINE HCL 25 MG PO CAPS
25.0000 mg | ORAL_CAPSULE | Freq: Every day | ORAL | 3 refills | Status: DC
Start: 2017-01-20 — End: 2017-02-18

## 2017-01-20 MED ORDER — ELETRIPTAN HYDROBROMIDE 40 MG PO TABS: ORAL_TABLET | ORAL | 3 refills | Status: DC

## 2017-01-20 MED FILL — NORTRIPTYLINE HCL 25 MG CAP: 25 | 30 days supply | Qty: 30 | Fill #0

## 2017-01-20 MED FILL — ELETRIPTAN HBR 40 MG TABLET: 40 | 30 days supply | Qty: 10 | Fill #0

## 2017-01-20 NOTE — Patient Instructions (Signed)
Migraine Recommendations: 1.  Start nortriptyline 25mg  at bedtime.  Call/MyChart me in 4 weeks with update and we can adjust dose if needed.  Take topiramate 50mg  daily for 1 week and then stop. 2.  Take Relpax at earliest onset of headache.  May repeat dose once in 2 hours if needed.  Do not exceed two tablets. in 24 hours. 3.  Limit use of pain relievers to no more than 2 days out of the week.  These medications include acetaminophen, ibuprofen, triptans and narcotics.  This will help reduce risk of rebound headaches. 4.  Be aware of common food triggers such as processed sweets, processed foods with nitrites (such as deli meat, hot dogs, sausages), foods with MSG, alcohol (such as wine), chocolate, certain cheeses, certain fruits (dried fruits, some citrus fruit), vinegar, diet soda. 4.  Avoid caffeine 5.  Routine exercise 6.  Proper sleep hygiene 7.  Stay adequately hydrated with water 8.  Keep a headache diary. 9.  Maintain proper stress management. 10.  Do not skip meals. 11.  Consider supplements:  Magnesium citrate 400mg  to 600mg  daily, riboflavin 400mg , Coenzyme Q 10 100mg  three times daily 12.  Follow up in 3 to 4 months.

## 2017-01-20 NOTE — Progress Notes (Signed)
NEUROLOGY CONSULTATION NOTE  AMANPREET DELMONT MRN: [id number] DOB: [birth year]  Referring provider: Dr. Harrington Challenger Primary care provider: Dr. Harrington Challenger  Reason for consult:  migraines  HISTORY OF PRESENT ILLNESS: [name] is a 51 year old right-handed female with thyroid disease, migraine and history of recurrent kidney stones who presents for  migraines.  History supplemented by PCP note.  Onset:  Since she was in her 4s, worse since [facility] and hysteroscopy in March 2018. Location:  unilateral/retro-orbital (right more than left) and back of head Quality:  pounding, stabbing Intensity:  "12"/10 Aura:  no Prodrome:  no Postdrome:  no Associated symptoms:  nausea, photophobia, phonophobia.  No vomiting or visual disturbance.  She has not had any new worse headache of her life, waking up from sleep Duration:  30 minutes with triptan (otherwise 1 to 2 days) Frequency:  15 to 20 days per month Frequency of abortive medication: 5 days per week Triggers/exacerbating factors:  stress, certain smells, heat Relieving factors:  dark quiet room Activity:  aggravates  Past NSAIDS:  no Past analgesics:  Excedrin (helps) Past abortive triptans:  no Past muscle relaxants:  no Past anti-emetic:  promethazine Past antihypertensive medications:  no Past antidepressant medications:  no Past anticonvulsant medications:  topiramate immediate release (paresthesias) Past vitamins/Herbal/Supplements:  no Past antihistamines/decongestants:  no Other past therapies:  no  Current NSAIDS:  Advil Current analgesics:  no Current triptans:  Relpax 40mg  (first line), sumatriptan 100mg  Current anti-emetic:  no Current muscle relaxants:  no Current anti-anxiolytic:  no Current sleep aide:  no Current Antihypertensive medications:  no Current Antidepressant medications:  no Current Anticonvulsant medications:  topiramate ER 100mg  Current Vitamins/Herbal/Supplements:  MVI Current  Antihistamines/Decongestants:  no Other therapy:  Chiropractor (for neck pain)  Caffeine:  Only for headache Alcohol:  no Smoker:  no Diet:  Hydrates, watches diet Exercise:  Walks daily Depression/anxiety:  controlled Sleep hygiene:  Good. Family history of headache:  Sister has migraines.  Grandmother had migraines. Other pertinent family history:  sister had a ruptured aneurysm.  PAST MEDICAL HISTORY: Past Medical History:  Diagnosis Date  . History of ectopic pregnancy    04/ 2007  S/P LEFT SALPINGECTOMY  . History of kidney stones   . Hypothyroidism   . Menorrhagia   . Migraine     PAST SURGICAL HISTORY: Past Surgical History:  Procedure Laterality Date  . DILATATION & CURETTAGE/HYSTEROSCOPY WITH MYOSURE  2016  . DILATATION & CURETTAGE/HYSTEROSCOPY WITH MYOSURE N/A 10/23/2016   Procedure: DILATATION & CURETTAGE/HYSTEROSCOPY;  Surgeon: Everlene Farrier, MD;  Location:  [facility];  Service: Gynecology;  Laterality: N/A;  . DILATION AND EVACUATION  10/07/2007   retained poc  . LAPAROSCOPIC UNILATERAL SALPINGECTOMY Left 11/24/2005    MEDICATIONS: Current Outpatient Prescriptions on File Prior to Visit  Medication Sig Dispense Refill  . levothyroxine (SYNTHROID, LEVOTHROID) 88 MCG tablet Take 88 mcg by mouth daily before breakfast.    . Multiple Vitamin (MULTIVITAMIN WITH MINERALS) TABS tablet Take 1 tablet by mouth daily.    . SUMAtriptan (IMITREX) 100 MG tablet Take 1 tablet (100 mg total) by mouth once as needed for migraine. May repeat in 2 hours if headache persists or recurs. 10 tablet 5   No current facility-administered medications on file prior to visit.     ALLERGIES: No Known Allergies  FAMILY HISTORY: Family History  Problem Relation Age of Onset  . Hypertension Mother   . Aortic aneurysm Mother   . Liver cancer Father   .  Prostate cancer Brother   . Migraines Sister     SOCIAL HISTORY: Social History   Social History  . Marital  status: Widowed    Spouse name: N/A  . Number of children: 0  . Years of education: 37   Occupational History  . [employer]    Social History Main Topics  . Smoking status: Never Smoker  . Smokeless tobacco: Never Used  . Alcohol use No  . Drug use: No  . Sexual activity: Not on file   Other Topics Concern  . Not on file   Social History Narrative   Lives at home alone   Right-handed   Caffeine: weekly    REVIEW OF SYSTEMS: Constitutional: No fevers, chills, or sweats, no generalized fatigue, change in appetite Eyes: No visual changes, double vision, eye pain Ear, nose and throat: No hearing loss, ear pain, nasal congestion, sore throat Cardiovascular: No chest pain, palpitations Respiratory:  No shortness of breath at rest or with exertion, wheezes GastrointestinaI: No nausea, vomiting, diarrhea, abdominal pain, fecal incontinence Genitourinary:  No dysuria, urinary retention or frequency Musculoskeletal:  No neck pain, back pain Integumentary: No rash, pruritus, skin lesions Neurological: as above Psychiatric: No depression, insomnia, anxiety Endocrine: No palpitations, fatigue, diaphoresis, mood swings, change in appetite, change in weight, increased thirst Hematologic/Lymphatic:  No purpura, petechiae. Allergic/Immunologic: no itchy/runny eyes, nasal congestion, recent allergic reactions, rashes  PHYSICAL EXAM: Vitals:   01/20/17 0811  BP: 120/60  Pulse: 84   General: No acute distress.  Patient appears well-groomed.  Head:  Normocephalic/atraumatic Eyes:  fundi examined but not visualized Neck: supple, no paraspinal tenderness, full range of motion Back: No paraspinal tenderness Heart: regular rate and rhythm Lungs: Clear to auscultation bilaterally. Vascular: No carotid bruits. Neurological Exam: Mental status: alert and oriented to person, place, and time, recent and remote memory intact, fund of knowledge intact, attention and  concentration intact, speech fluent and not dysarthric, language intact. Cranial nerves: CN I: not tested CN II: pupils equal, round and reactive to light, visual fields intact CN III, IV, VI:  full range of motion, no nystagmus, no ptosis CN V: facial sensation intact CN VII: upper and lower face symmetric CN VIII: hearing intact CN IX, X: gag intact, uvula midline CN XI: sternocleidomastoid and trapezius muscles intact CN XII: tongue midline Bulk & Tone: normal, no fasciculations. Motor:  5/5 throughout  Sensation: temperature and vibration sensation intact. Deep Tendon Reflexes:  2+ throughout, toes downgoing.  Finger to nose testing:  Without dysmetria.  Heel to shin:  Without dysmetria.  Gait:  Normal station and stride.  Able to turn and tandem walk. Romberg negative.  IMPRESSION: Chronic migraine without aura. She has a sister with history of ruptured aneurysm.  I don't think MRA is necessary.  She is suffering from her habitual migraines and with only one first degree relative with aneurysm, imaging is not warranted.  PLAN: 1.  Topiramate has been ineffective and she has history of recurrent kidney stones (last one was in January).  We will taper off topiramate and start nortriptyline 25mg  at bedtime 2.  Relpax for abortive therapy 3.  Limit use of pain relievers to no more than 2 days out of the week.  These medications include acetaminophen, ibuprofen, triptans and narcotics.  This will help reduce risk of rebound headaches. 4.  Be aware of common food triggers such as processed sweets, processed foods with nitrites (such as deli meat, hot dogs, sausages), foods with MSG,  alcohol (such as wine), chocolate, certain cheeses, certain fruits (dried fruits, some citrus fruit), vinegar, diet soda. 4.  Avoid caffeine 5.  Routine exercise 6.  Proper sleep hygiene 7.  Stay adequately hydrated with water 8.  Keep a headache diary. 9.  Maintain proper stress management. 10.  Do not  skip meals. 11.  Consider supplements:  Magnesium citrate 400mg  to 600mg  daily, riboflavin 400mg , Coenzyme Q 10 100mg  three times daily 12.  Follow up in 3 to 4 months.  Thank you for allowing me to take part in the care of this patient.  Metta Clines, DO  CC:  C. Melinda Crutch, MD

## 2017-02-02 MED FILL — LEVOTHYROXINE 88 MCG TABLET: 88 | 90 days supply | Qty: 90 | Fill #1

## 2017-02-06 ENCOUNTER — Telehealth: Payer: Self-pay | Admitting: Neurology

## 2017-02-06 NOTE — Telephone Encounter (Signed)
Left message for patient to increase Nortriptyline to 50 mg at bedtime and she will have to stay on that for a month before any further changes  L/M for her to contact the office if she has any further questions

## 2017-02-06 NOTE — Telephone Encounter (Signed)
Dr Jaffe-  Please advise 

## 2017-02-06 NOTE — Telephone Encounter (Signed)
Patient cant tell a difference on the medication  And has already has had 10 migraine this month she comes back on 02-18-17 and already went through pills  Patient phone  number is [phone] will be there until 5 or you may call her on the cell phone number  [phone]

## 2017-02-06 NOTE — Telephone Encounter (Signed)
She is actually improving, as she told me last month that she was having 15 to 20 headache days a month.  Increase nortriptyline to 50mg  at bedtime.  I have patients on a dose for a month before making further changes.  She should know that I will want her on this dose for a month.

## 2017-02-13 MED FILL — ELETRIPTAN HBR 40 MG TABLET: 40 | 25 days supply | Qty: 10 | Fill #1

## 2017-02-18 ENCOUNTER — Encounter: Payer: Self-pay | Admitting: Neurology

## 2017-02-18 ENCOUNTER — Ambulatory Visit (INDEPENDENT_AMBULATORY_CARE_PROVIDER_SITE_OTHER): Payer: 59 | Admitting: Neurology

## 2017-02-18 VITALS — BP 110/70 | HR 94 | Ht 67.0 in | Wt 174.4 lb

## 2017-02-18 DIAGNOSIS — G43809 Other migraine, not intractable, without status migrainosus: Secondary | ICD-10-CM | POA: Diagnosis not present

## 2017-02-18 DIAGNOSIS — G43709 Chronic migraine without aura, not intractable, without status migrainosus: Secondary | ICD-10-CM

## 2017-02-18 DIAGNOSIS — M542 Cervicalgia: Secondary | ICD-10-CM | POA: Diagnosis not present

## 2017-02-18 MED ORDER — TIZANIDINE HCL 2 MG PO TABS: 2.0000 mg | ORAL_TABLET | Freq: Every evening | ORAL | 0 refills | Status: DC | PRN

## 2017-02-18 MED ORDER — NORTRIPTYLINE HCL 50 MG PO CAPS: 50.0000 mg | ORAL_CAPSULE | Freq: Every day | ORAL | 1 refills | Status: DC

## 2017-02-18 MED FILL — NORTRIPTYLINE HCL 50 MG CAP: 50 | 30 days supply | Qty: 30 | Fill #0

## 2017-02-18 MED FILL — tiZANidine HCL 4 MG TABS: 4 | 30 days supply | Qty: 15 | Fill #0
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
## 2017-02-18 NOTE — Patient Instructions (Signed)
1.  Increase nortriptyline to 50mg  at bedtime.  If doing well at the end of the month, refill it.  If not, contact us and we can increase dose further to 100mg . 2.  Try tizanidine 2mg  at bedtime (a muscle relaxant) to see if it helps reduce neck pain and headache when you wake up. 3.  We will refer you to Dr. Hulan Saas, a Sports Medicine doctor who treats neck and spine problems. 4.  Follow up in 2 months.

## 2017-02-23 ENCOUNTER — Telehealth: Payer: Self-pay | Admitting: Neurology

## 2017-02-23 NOTE — Telephone Encounter (Signed)
Caller: Kayti  Urgent? No  Reason for the call: Regarding her referral to Dr. Hulan Saas. Please call. Thanks

## 2017-02-23 NOTE — Telephone Encounter (Signed)
Called patient and left message that her appointment is on Friday, August 3 at 10:00 with Dr. Hulan Saas.

## 2017-02-24 ENCOUNTER — Telehealth: Payer: Self-pay | Admitting: Neurology

## 2017-02-24 NOTE — Telephone Encounter (Signed)
Left message giving patient the number to Sports Medicine.

## 2017-02-24 NOTE — Telephone Encounter (Signed)
Caller: [name]  Urgent? No  Reason for the call: Needing to Reschedule the appointment time with Dr. Hulan Saas. She said she cannot do 10:00 AM. She is available 8:00 AM or last thing in the afternoon. Please Advise. Thanks

## 2017-03-04 MED FILL — SUMATRIPTAN SUCC 100 MG TAB: 100 | 17 days supply | Qty: 10 | Fill #1

## 2017-03-12 DIAGNOSIS — Z01419 Encounter for gynecological examination (general) (routine) without abnormal findings: Secondary | ICD-10-CM | POA: Diagnosis not present

## 2017-03-12 DIAGNOSIS — Z6829 Body mass index (BMI) 29.0-29.9, adult: Secondary | ICD-10-CM | POA: Diagnosis not present

## 2017-03-12 MED FILL — ELETRIPTAN HBR 40 MG TABLET: 40 | 25 days supply | Qty: 10 | Fill #2

## 2017-03-13 ENCOUNTER — Telehealth: Payer: Self-pay | Admitting: Neurology

## 2017-03-13 ENCOUNTER — Ambulatory Visit: Payer: 59 | Admitting: Family Medicine

## 2017-03-13 NOTE — Telephone Encounter (Signed)
Patient called needing to see if she can increase her 50 MG to 100 MG of Nortriptyline. She said the 50 MG is not working. Please Advise. Thanks

## 2017-03-13 NOTE — Telephone Encounter (Signed)
LM on Pts VM, advsd will call back Monday when I receive a response.

## 2017-03-16 ENCOUNTER — Other Ambulatory Visit: Payer: Self-pay

## 2017-03-16 MED ORDER — NORTRIPTYLINE HCL 50 MG PO CAPS: 50.0000 mg | ORAL_CAPSULE | Freq: Every day | ORAL | 11 refills | Status: DC

## 2017-03-16 NOTE — Telephone Encounter (Signed)
Pt states nortriptyline 50 mg QD is not effective, would like to increase to 100 mg QD.

## 2017-03-16 NOTE — Telephone Encounter (Signed)
Spoke w/Pt, advised it is ok to increase nortriptyline to 100 mg. Pt requested Rx to sent to pharmacy for 100 mg, will be out of the 50 mg soon.-Called [facility], spoke with Aaron Edelman, nortriptyline is not available in 100 mg-will send for 50 mg 2QHS

## 2017-03-16 NOTE — Telephone Encounter (Signed)
Yes per Dr. Tomi Likens notes

## 2017-03-16 NOTE — Progress Notes (Signed)
[name] [address] [place], [place] [address] Phone: [phone] Subjective:    I'm seeing this patient by the request  of:  Jaffe DO  CC: Neck and headache  OYD:XAJOINOMVE  [name] is a 51 y.o. female coming in with complaint of neck and migraines. Patient has been seen neurology for this. Patient has been trying different medications. Recently increased and nortriptyline 50 mg at bedtime and maybe potentially be increasing in 100 mg. Muscle relaxer given to help reduce the neck pain. Patient states      Past Medical History:  Diagnosis Date  . History of ectopic pregnancy    04/ 2007  S/P LEFT SALPINGECTOMY  . History of kidney stones   . Hypothyroidism   . Menorrhagia   . Migraine    Past Surgical History:  Procedure Laterality Date  . DILATATION & CURETTAGE/HYSTEROSCOPY WITH MYOSURE  2016  . DILATATION & CURETTAGE/HYSTEROSCOPY WITH MYOSURE N/A 10/23/2016   Procedure: DILATATION & CURETTAGE/HYSTEROSCOPY;  Surgeon: Everlene Farrier, MD;  Location: [facility];  Service: Gynecology;  Laterality: N/A;  . DILATION AND EVACUATION  10/07/2007   retained poc  . LAPAROSCOPIC UNILATERAL SALPINGECTOMY Left 11/24/2005   Social History   Social History  . Marital status: Widowed    Spouse name: N/A  . Number of children: 0  . Years of education: 75   Occupational History  . [employer]    Social History Main Topics  . Smoking status: Never Smoker  . Smokeless tobacco: Never Used  . Alcohol use No  . Drug use: No  . Sexual activity: Not Asked   Other Topics Concern  . None   Social History Narrative   Lives at home alone   Right-handed   Caffeine: weekly   No Known Allergies Family History  Problem Relation Age of Onset  . Hypertension Mother   . Aortic aneurysm Mother   . Liver cancer Father   . Prostate cancer Brother   . Migraines Sister      Past medical history, social, surgical  and family history all reviewed in electronic medical record.  No pertanent information unless stated regarding to the chief complaint.   Review of Systems:Review of systems updated and as accurate as of 03/17/17  No  visual changes, nausea, vomiting, diarrhea, constipation, dizziness, abdominal pain, skin rash, fevers, chills, night sweats, weight loss, swollen lymph nodes, body aches, joint swelling,  chest pain, shortness of breath, mood changes. Positive muscle aches and headache  Objective  Blood pressure 116/82, pulse 89, height 5\' 7"  (1.702 m), weight 174 lb (78.9 kg), SpO2 97 %. Systems examined below as of 03/17/17   General: No apparent distress alert and oriented x3 mood and affect normal, dressed appropriately.  HEENT: Pupils equal, extraocular movements intact  Respiratory: Patient's speak in full sentences and does not appear short of breath  Cardiovascular: No lower extremity edema, non tender, no erythema  Skin: Warm dry intact with no signs of infection or rash on extremities or on axial skeleton.  Abdomen: Soft nontender  Neuro: Cranial nerves II through XII are intact, neurovascularly intact in all extremities with 2+ DTRs and 2+ pulses.  Lymph: No lymphadenopathy of posterior or anterior cervical chain or axillae bilaterally.  Gait normal with good balance and coordination.  MSK:  Non tender with full range of motion and good stability and symmetric strength and tone of shoulders, elbows, wrist, hip, knee and ankles bilaterally.  Neck: Inspection Straightening noted. No palpable stepoffs. Negative Spurling's maneuver. Lacks last 5 in all planes Grip strength and sensation normal in bilateral hands Strength good C4 to T1 distribution No sensory change to C4 to T1 Negative Hoffman sign bilaterally Reflexes normal Patient does have some kyphosis with mild scoliosis noted.  Osteopathic findings C2 flexed rotated and side bent right C4 flexed rotated and side bent  left C6 flexed rotated and side bent left T3 extended rotated and side bent right inhaled third rib T6 extended rotated and side bent left L2 flexed rotated and side bent right Sacrum right on right  Procedure note 61950; 15 additional minutes spent for Therapeutic exercises as stated in above notes.  This included exercises focusing on stretching, strengthening, with significant focus on eccentric aspects.   Long term goals include an improvement in range of motion, strength, endurance as well as avoiding reinjury. Patient's frequency would include in 1-2 times a day, 3-5 times a week for a duration of 6-12 weeks. Exercises that included:  Basic scapular stabilization to include adduction and depression of scapula Scaption, focusing on proper movement and good control Internal and External rotation utilizing a theraband, with elbow tucked at side entire time Rows with theraband which was given today    Proper technique shown and discussed handout in great detail with ATC.  All questions were discussed and answered.      Impression and Recommendations:     This case required medical decision making of moderate complexity.      Note: This dictation was prepared with Dragon dictation along with smaller phrase technology. Any transcriptional errors that result from this process are unintentional.

## 2017-03-17 ENCOUNTER — Ambulatory Visit (INDEPENDENT_AMBULATORY_CARE_PROVIDER_SITE_OTHER): Payer: 59 | Admitting: Family Medicine

## 2017-03-17 ENCOUNTER — Encounter: Payer: Self-pay | Admitting: Family Medicine

## 2017-03-17 ENCOUNTER — Other Ambulatory Visit: Payer: Self-pay | Admitting: Neurology

## 2017-03-17 DIAGNOSIS — G4486 Cervicogenic headache: Secondary | ICD-10-CM | POA: Insufficient documentation

## 2017-03-17 DIAGNOSIS — M999 Biomechanical lesion, unspecified: Secondary | ICD-10-CM | POA: Diagnosis not present

## 2017-03-17 DIAGNOSIS — R51 Headache: Secondary | ICD-10-CM | POA: Diagnosis not present

## 2017-03-17 MED ORDER — DICLOFENAC SODIUM 2 % TD SOLN: 2.0000 "application " | Freq: Two times a day (BID) | TRANSDERMAL | 3 refills | Status: DC

## 2017-03-17 MED FILL — tiZANidine HCL 4 MG TABS: 4 | 30 days supply | Qty: 15 | Fill #0

## 2017-03-17 NOTE — Patient Instructions (Addendum)
Good to see you.  Ice 20 minutes 2 times daily. Usually after activity and before bed. Exercises 3 times a week.  On wall with heels, butt shoulder and head touching for a goal of 5 minutes daily  Stay active Try when working at the computer keep monitor at eye level and maybe tennis ball between shoulders pennsaid pinkie amount topically 2 times daily as needed.   Over the counter get  Vitamin D 2000 Iu daily  Turmeric 500mg  tice daily  This will be a process.  See em again in 1 month

## 2017-03-17 NOTE — Assessment & Plan Note (Signed)
Patient does have cervicogenic headache. Can continue to use the muscle relaxer with patient is getting some improvement. Patient will continue on the nortriptyline and is titrating up to 100 mg's. We discussed icing regimen. Patient will work on Lawyer. Work with Product/process development scientist to learn home exercises for more posture and ergonomics. Patient even once weekly vitamin D. Patient will come back and see me again in 4 weeks.

## 2017-03-17 NOTE — Assessment & Plan Note (Signed)
Decision today to treat with OMT was based on Physical Exam  After verbal consent patient was treated with HVLA, ME, FPR techniques in cervical, thoracic, lumbar and sacral areas  Patient tolerated the procedure well with improvement in symptoms  Patient given exercises, stretches and lifestyle modifications  See medications in patient instructions if given  Patient will follow up in 4 weeks 

## 2017-03-18 ENCOUNTER — Telehealth: Payer: Self-pay | Admitting: Neurology

## 2017-03-18 MED FILL — NORTRIPTYLINE HCL 50 MG CAP: 50 | 30 days supply | Qty: 60 | Fill #0

## 2017-03-18 NOTE — Telephone Encounter (Signed)
Patient is calling about her medication and needs this taken care of before we leave today or she cant get her medication and her pharmacy closes at 6:00 and she is out of her medication

## 2017-03-18 NOTE — Telephone Encounter (Signed)
Patient is calling regarding her Nortriptyline medication. She said she cannot take 2 50 mg tablets, that they are too big and she will choke. I explained to her that the 100 mg were not available. See prior note. Please Advise. Thanks

## 2017-03-19 ENCOUNTER — Telehealth: Payer: Self-pay | Admitting: Neurology

## 2017-03-19 NOTE — Telephone Encounter (Signed)
[facility], spoke w/Judy she said Pt had already picked up Rx-she corrected it to 2QHS

## 2017-03-19 NOTE — Telephone Encounter (Signed)
LM on VM  Advising Pt that the medication does not come in 100 mg tabs, per Aaron Edelman at [facility] and to call me back if she decides to go back to one (1) 50 mg tab QD.

## 2017-03-19 NOTE — Telephone Encounter (Signed)
Patient called needing you to call the pharmacy so they can change the prescription from 1 to 2. They cannot fill it until that is changed. Please call. Thanks

## 2017-04-06 MED FILL — ELETRIPTAN HBR 40 MG TABLET: 40 | 25 days supply | Qty: 10 | Fill #3

## 2017-04-15 ENCOUNTER — Other Ambulatory Visit: Payer: Self-pay | Admitting: Neurology

## 2017-04-15 MED FILL — NORTRIPTYLINE HCL 50 MG CAP: 50 | 30 days supply | Qty: 60 | Fill #1

## 2017-04-15 MED FILL — tiZANidine HCL 4 MG TABS: 4 | 30 days supply | Qty: 15 | Fill #0

## 2017-04-17 ENCOUNTER — Ambulatory Visit: Payer: 59 | Admitting: Family Medicine

## 2017-04-22 ENCOUNTER — Ambulatory Visit (INDEPENDENT_AMBULATORY_CARE_PROVIDER_SITE_OTHER): Payer: 59 | Admitting: Neurology

## 2017-04-22 ENCOUNTER — Encounter: Payer: Self-pay | Admitting: Neurology

## 2017-04-22 VITALS — BP 122/86 | HR 96 | Ht 67.0 in | Wt 179.1 lb

## 2017-04-22 DIAGNOSIS — R51 Headache: Secondary | ICD-10-CM

## 2017-04-22 DIAGNOSIS — G43709 Chronic migraine without aura, not intractable, without status migrainosus: Secondary | ICD-10-CM

## 2017-04-22 DIAGNOSIS — R519 Headache, unspecified: Secondary | ICD-10-CM

## 2017-04-22 DIAGNOSIS — Z8249 Family history of ischemic heart disease and other diseases of the circulatory system: Secondary | ICD-10-CM | POA: Diagnosis not present

## 2017-04-22 DIAGNOSIS — G4452 New daily persistent headache (NDPH): Secondary | ICD-10-CM | POA: Diagnosis not present

## 2017-04-22 DIAGNOSIS — G43809 Other migraine, not intractable, without status migrainosus: Secondary | ICD-10-CM | POA: Diagnosis not present

## 2017-04-22 MED FILL — SUMATRIPTAN SUCC 100 MG TAB: 100 | 17 days supply | Qty: 10 | Fill #2

## 2017-04-22 NOTE — Addendum Note (Signed)
Addended by: Clois Comber on: 04/22/2017 09:42 AM   Modules accepted: Orders

## 2017-04-22 NOTE — Patient Instructions (Signed)
1.  Continue nortriptyline 100mg  at bedtime.  If no significant improvement over the next 2-3 weeks, contact me and we can initiate preauthorization to start Botox 2.  Continue Relpax as needed (limit use of all triptans/pain relievers to no more than 2 days out of the week) 3.  Will get MRI and MRA of head 4.  Follow up in 3 months or for Botox if needed.
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
## 2017-04-29 NOTE — Progress Notes (Signed)
[name] [place], [place] [address] Phone: [phone] Subjective:     CC: Headache follow-up  [id number]  [name] is a 51 y.o. female coming in with complaint of headaches. Patient is been doing fairly well with conservative therapy including osteopathic manipulation. Doing home exercises, icing regimen, avoiding certain triggers. Patient states doing well Patient is having some mild discomfort and still. Patient states that it is not always better but still seems to be less frequent and less severe      Past Medical History:  Diagnosis Date  . History of ectopic pregnancy    04/ 2007  S/P LEFT SALPINGECTOMY  . History of kidney stones   . Hypothyroidism   . Menorrhagia   . Migraine    Past Surgical History:  Procedure Laterality Date  . DILATATION & CURETTAGE/HYSTEROSCOPY WITH MYOSURE  2016  . DILATATION & CURETTAGE/HYSTEROSCOPY WITH MYOSURE N/A 10/23/2016   Procedure: DILATATION & CURETTAGE/HYSTEROSCOPY;  Surgeon: Everlene Farrier, MD;  Location: [place];  Service: Gynecology;  Laterality: N/A;  . DILATION AND EVACUATION  10/07/2007   retained poc  . LAPAROSCOPIC UNILATERAL SALPINGECTOMY Left 11/24/2005   Social History   Social History  . Marital status: Widowed    Spouse name: N/A  . Number of children: 0  . Years of education: 43   Occupational History  . [employer]    Social History Main Topics  . Smoking status: Never Smoker  . Smokeless tobacco: Never Used  . Alcohol use No  . Drug use: No  . Sexual activity: Not on file   Other Topics Concern  . Not on file   Social History Narrative   Lives at home alone   Right-handed   Caffeine: weekly   No Known Allergies Family History  Problem Relation Age of Onset  . Hypertension Mother   . Aortic aneurysm Mother   . Liver cancer Father   . Prostate cancer Brother   . Migraines Sister      Past medical  history, social, surgical and family history all reviewed in electronic medical record.  No pertanent information unless stated regarding to the chief complaint.   Review of Systems: No , visual changes, nausea, vomiting, diarrhea, constipation, dizziness, abdominal pain, skin rash, fevers, chills, night sweats, weight loss, swollen lymph nodes, body aches, joint swelling, muscle aches, chest pain, shortness of breath, mood changes.  Positive headaches  Objective  There were no vitals taken for this visit. Systems examined below as of 04/29/17   General: No apparent distress alert and oriented x3 mood and affect normal, dressed appropriately.  HEENT: Pupils equal, extraocular movements intact  Respiratory: Patient's speak in full sentences and does not appear short of breath  Cardiovascular: No lower extremity edema, non tender, no erythema  Skin: Warm dry intact with no signs of infection or rash on extremities or on axial skeleton.  Abdomen: Soft nontender  Neuro: Cranial nerves II through XII are intact, neurovascularly intact in all extremities with 2+ DTRs and 2+ pulses.  Lymph: No lymphadenopathy of posterior or anterior cervical chain or axillae bilaterally.  Gait normal with good balance and coordination.  MSK:  Non tender with full range of motion and good stability and symmetric strength and tone of shoulders, elbows, wrist, hip, knee and ankles bilaterally.  Neck: Inspection unremarkable. No palpable stepoffs. Negative Spurling's maneuver. Limited range of motion with left-sided rotation Grip strength and sensation normal in bilateral  hands Strength good C4 to T1 distribution No sensory change to C4 to T1 Negative Hoffman sign bilaterally Reflexes normal Positive tightness of the trapezius bilaterally  Osteopathic findings C4 flexed rotated and side bent left C6 flexed rotated and side bent left T3 extended rotated and side bent right inhaled third rib T9 extended  rotated and side bent left L3 flexed rotated and side bent right Sacrum right on right    Impression and Recommendations:     This case required medical decision making of moderate complexity.      Note: This dictation was prepared with Dragon dictation along with smaller phrase technology. Any transcriptional errors that result from this process are unintentional.

## 2017-04-30 ENCOUNTER — Encounter: Payer: Self-pay | Admitting: Family Medicine

## 2017-04-30 ENCOUNTER — Ambulatory Visit (INDEPENDENT_AMBULATORY_CARE_PROVIDER_SITE_OTHER): Payer: 59 | Admitting: Family Medicine

## 2017-04-30 VITALS — BP 118/92 | HR 81 | Ht 67.0 in | Wt 180.0 lb

## 2017-04-30 DIAGNOSIS — R51 Headache: Secondary | ICD-10-CM | POA: Diagnosis not present

## 2017-04-30 DIAGNOSIS — M999 Biomechanical lesion, unspecified: Secondary | ICD-10-CM | POA: Diagnosis not present

## 2017-04-30 DIAGNOSIS — G4486 Cervicogenic headache: Secondary | ICD-10-CM

## 2017-04-30 MED ORDER — VITAMIN D (ERGOCALCIFEROL) 1.25 MG (50000 UNIT) PO CAPS: 50000.0000 [IU] | ORAL_CAPSULE | ORAL | 0 refills | Status: DC

## 2017-04-30 MED FILL — VIT D2 1.25 MG (50,000 UNIT: 1.25 MG | 84 days supply | Qty: 12 | Fill #0

## 2017-04-30 NOTE — Assessment & Plan Note (Signed)
Still having headaches. Did not make any significant changes. We discussed multiple different medications in patient did decide to start on the once weekly vitamin D. We'll continue her other medications at this time. We discussed icing regimen, home exercises, which activities to do a which ones to avoid. Patient will increase activity as tolerated over the course of next several weeks. Follow-up again in 4-6 weeks

## 2017-04-30 NOTE — Patient Instructions (Addendum)
Good to see you  Overall it will take time.  You have made some improvement Once weekly vitamin D instead of daily  Keep up with everything else.  See me again in 4-6 weeks.

## 2017-04-30 NOTE — Assessment & Plan Note (Signed)
Decision today to treat with OMT was based on Physical Exam  After verbal consent patient was treated with HVLA, ME, FPR techniques in cervical, thoracic, rib, lumbar and sacral areas  Patient tolerated the procedure well with improvement in symptoms  Patient given exercises, stretches and lifestyle modifications  See medications in patient instructions if given  Patient will follow up in 4-6 weeks 

## 2017-05-01 ENCOUNTER — Encounter: Payer: Self-pay | Admitting: Neurology

## 2017-05-01 ENCOUNTER — Other Ambulatory Visit: Payer: Self-pay | Admitting: Neurology

## 2017-05-01 MED FILL — ELETRIPTAN HBR 40 MG TABLET: 40 | 30 days supply | Qty: 9 | Fill #0

## 2017-05-04 ENCOUNTER — Other Ambulatory Visit: Payer: Self-pay

## 2017-05-07 ENCOUNTER — Encounter: Payer: Self-pay | Admitting: Neurology

## 2017-05-07 ENCOUNTER — Telehealth: Payer: Self-pay | Admitting: Neurology

## 2017-05-07 NOTE — Telephone Encounter (Signed)
Pt called and said she sent a message to Dr Tomi Likens through my chart and did not hear back from him regarding her MRI and wants a call back  CB# [phone]

## 2017-05-07 NOTE — Telephone Encounter (Signed)
Called and discussed the MRI with Pt, she wanted the ICD-10 codes. Her responsibility for the MRI is $500, she is unable to come up with that right now. She thought if I coded it differently it would be covered differently. I advsd her it was not coded as preventative. She also sent a myChart message, I forwarded that to Dr Tomi Likens and advsd her I will call her tomorrow when I get a resonse from him.

## 2017-05-08 ENCOUNTER — Encounter: Payer: Self-pay | Admitting: Neurology

## 2017-05-09 ENCOUNTER — Other Ambulatory Visit: Payer: 59

## 2017-05-14 ENCOUNTER — Encounter: Payer: Self-pay | Admitting: Neurology

## 2017-05-14 ENCOUNTER — Other Ambulatory Visit: Payer: Self-pay | Admitting: Neurology

## 2017-05-14 ENCOUNTER — Other Ambulatory Visit: Payer: Self-pay

## 2017-05-14 MED ORDER — NORTRIPTYLINE HCL 50 MG PO CAPS: 100.0000 mg | ORAL_CAPSULE | Freq: Every day | ORAL | 5 refills | Status: DC

## 2017-05-14 MED FILL — NORTRIPTYLINE HCL 50 MG CAP: 50 | 30 days supply | Qty: 60 | Fill #2

## 2017-05-14 MED FILL — tiZANidine HCL 4 MG TABS: 4 | 30 days supply | Qty: 15 | Fill #0

## 2017-05-20 ENCOUNTER — Ambulatory Visit
Admission: RE | Admit: 2017-05-20 | Discharge: 2017-05-20 | Disposition: A | Discharge status: Home / Self Care | Payer: 59 | Source: Ambulatory Visit | Attending: Neurology | Admitting: Neurology

## 2017-05-20 DIAGNOSIS — R51 Headache: Secondary | ICD-10-CM | POA: Diagnosis not present

## 2017-05-20 DIAGNOSIS — G43709 Chronic migraine without aura, not intractable, without status migrainosus: Secondary | ICD-10-CM

## 2017-05-20 DIAGNOSIS — Z8249 Family history of ischemic heart disease and other diseases of the circulatory system: Secondary | ICD-10-CM

## 2017-05-20 DIAGNOSIS — G43809 Other migraine, not intractable, without status migrainosus: Secondary | ICD-10-CM

## 2017-05-20 DIAGNOSIS — G4452 New daily persistent headache (NDPH): Secondary | ICD-10-CM

## 2017-05-20 DIAGNOSIS — R519 Headache, unspecified: Secondary | ICD-10-CM

## 2017-05-21 ENCOUNTER — Telehealth: Payer: Self-pay | Admitting: Neurology

## 2017-05-21 MED FILL — LEVOTHYROXINE 88 MCG TABLET: 88 | 90 days supply | Qty: 90 | Fill #2

## 2017-05-21 NOTE — Telephone Encounter (Signed)
-----   Message from Pieter Partridge, DO sent at 05/21/2017  7:09 AM EDT ----- MRI and MRA of head shows nothing concerning.  It demonstrates very mild little spots on the brain that often is seen in people with migraines but nothing of any concern.  There are no aneurysms.

## 2017-05-21 NOTE — Telephone Encounter (Signed)
Mychart message sent to patient.

## 2017-05-23 ENCOUNTER — Other Ambulatory Visit: Payer: 59

## 2017-05-25 MED FILL — SUMATRIPTAN SUCC 100 MG TAB: 100 | 28 days supply | Qty: 10 | Fill #3

## 2017-05-30 NOTE — Progress Notes (Signed)
[name] [address] [place], [place] [address] Phone: [phone] Subjective:    I'm seeing this patient by the request  of:    CC: Headache follow-up  [id number]  [name] is a 51 y.o. female coming in with complaint of follow-up. Seem to be more of a cervicogenic. Patient was given home exercises, icing regimen, patient was also started osteopathic manipulation. Patient states she has been in contact with her neurologist and doing well with the nortriptyline. Has been doing relatively well overall. Patient states that the change in nortriptyline has been very helpful. Patient states that the severity and frequency headaches have improved.   Patient did have an MRI/MRA of the head showing nothing concerning.    Past Medical History:  Diagnosis Date  . History of ectopic pregnancy    04/ 2007  S/P LEFT SALPINGECTOMY  . History of kidney stones   . Hypothyroidism   . Menorrhagia   . Migraine    Past Surgical History:  Procedure Laterality Date  . DILATATION & CURETTAGE/HYSTEROSCOPY WITH MYOSURE  2016  . DILATATION & CURETTAGE/HYSTEROSCOPY WITH MYOSURE N/A 10/23/2016   Procedure: DILATATION & CURETTAGE/HYSTEROSCOPY;  Surgeon: Everlene Farrier, MD;  Location: [place];  Service: Gynecology;  Laterality: N/A;  . DILATION AND EVACUATION  10/07/2007   retained poc  . LAPAROSCOPIC UNILATERAL SALPINGECTOMY Left 11/24/2005   Social History   Social History  . Marital status: Widowed    Spouse name: N/A  . Number of children: 0  . Years of education: 71   Occupational History  . [employer]    Social History Main Topics  . Smoking status: Never Smoker  . Smokeless tobacco: Never Used  . Alcohol use No  . Drug use: No  . Sexual activity: Not Asked   Other Topics Concern  . None   Social History Narrative   Lives at home alone   Right-handed   Caffeine: weekly   No Known Allergies Family  History  Problem Relation Age of Onset  . Hypertension Mother   . Aortic aneurysm Mother   . Liver cancer Father   . Prostate cancer Brother   . Migraines Sister      Past medical history, social, surgical and family history all reviewed in electronic medical record.  No pertanent information unless stated regarding to the chief complaint.   Review of Systems:Review of systems updated and as accurate as of 06/01/17  No visual changes, nausea, vomiting, diarrhea, constipation, dizziness, abdominal pain, skin rash, fevers, chills, night sweats, weight loss, swollen lymph nodes, body aches, joint swelling, chest pain, shortness of breath, mood changes. Positive headaches muscle aches  Objective  Blood pressure 122/88, pulse (!) 104, height 5\' 7"  (1.702 m), weight 180 lb (81.6 kg), SpO2 98 %. Systems examined below as of 06/01/17   General: No apparent distress alert and oriented x3 mood and affect normal, dressed appropriately.  HEENT: Pupils equal, extraocular movements intact  Respiratory: Patient's speak in full sentences and does not appear short of breath  Cardiovascular: No lower extremity edema, non tender, no erythema  Skin: Warm dry intact with no signs of infection or rash on extremities or on axial skeleton.  Abdomen: Soft nontender  Neuro: Cranial nerves II through XII are intact, neurovascularly intact in all extremities with 2+ DTRs and 2+ pulses.  Lymph: No lymphadenopathy of posterior or anterior cervical chain or axillae bilaterally.  Gait normal with good balance and coordination.  MSK:  Non tender with full range of motion and good stability and symmetric strength and tone of shoulders, elbows, wrist, hip, knee and ankles bilaterally.  Neck: Inspection Mild loss in lordosis. No palpable stepoffs. Negative Spurling's maneuver. Lacks last 10 of extension and side bending bilaterally Grip strength and sensation normal in bilateral hands Strength good C4 to T1  distribution No sensory change to C4 to T1 Negative Hoffman sign bilaterally Reflexes normal  Osteopathic findings C2 flexed rotated and side bent right C4 flexed rotated and side bent left C7 flexed rotated and side bent left T3 extended rotated and side bent right inhaled third rib T6 extended rotated and side bent left L1 flexed rotated and side bent right Sacrum right on right    Impression and Recommendations:     This case required medical decision making of moderate complexity.      Note: This dictation was prepared with Dragon dictation along with smaller phrase technology. Any transcriptional errors that result from this process are unintentional.

## 2017-06-01 ENCOUNTER — Encounter: Payer: Self-pay | Admitting: Family Medicine

## 2017-06-01 ENCOUNTER — Ambulatory Visit (INDEPENDENT_AMBULATORY_CARE_PROVIDER_SITE_OTHER): Payer: 59 | Admitting: Family Medicine

## 2017-06-01 VITALS — BP 122/88 | HR 104 | Ht 67.0 in | Wt 180.0 lb

## 2017-06-01 DIAGNOSIS — M999 Biomechanical lesion, unspecified: Secondary | ICD-10-CM | POA: Diagnosis not present

## 2017-06-01 DIAGNOSIS — G4486 Cervicogenic headache: Secondary | ICD-10-CM

## 2017-06-01 DIAGNOSIS — R51 Headache: Secondary | ICD-10-CM

## 2017-06-01 NOTE — Patient Instructions (Addendum)
Good to see you  [name] is your friend.  Posture is key  You are doing great! See me again in 3 months

## 2017-06-01 NOTE — Assessment & Plan Note (Signed)
Patient is been doing unremarkably well. Patient in follow-up in 3 months. No change in medications at this time.

## 2017-06-01 NOTE — Assessment & Plan Note (Signed)
Decision today to treat with OMT was based on Physical Exam  After verbal consent patient was treated with HVLA, ME, FPR techniques in cervical, thoracic, lumbar and sacral areas  Patient tolerated the procedure well with improvement in symptoms  Patient given exercises, stretches and lifestyle modifications  See medications in patient instructions if given  Patient will follow up in 1-2 weeks 

## 2017-06-02 MED FILL — ELETRIPTAN HBR 40 MG TABLET: 40 | 30 days supply | Qty: 9 | Fill #1

## 2017-06-14 MED FILL — NORTRIPTYLINE HCL 50 MG CAP: 50 | 30 days supply | Qty: 60 | Fill #3

## 2017-06-15 ENCOUNTER — Other Ambulatory Visit: Payer: Self-pay | Admitting: Neurology

## 2017-06-15 MED FILL — tiZANidine HCL 4 MG TABS: 4 | 30 days supply | Qty: 15 | Fill #0

## 2017-06-18 ENCOUNTER — Encounter: Payer: Self-pay | Admitting: Neurology

## 2017-06-24 ENCOUNTER — Telehealth: Payer: 59 | Admitting: Nurse Practitioner

## 2017-06-24 DIAGNOSIS — J0100 Acute maxillary sinusitis, unspecified: Secondary | ICD-10-CM

## 2017-06-24 MED ORDER — AMOXICILLIN-POT CLAVULANATE 875-125 MG PO TABS: 1.0000 | ORAL_TABLET | Freq: Two times a day (BID) | ORAL | 0 refills | Status: DC

## 2017-06-24 MED FILL — AMOX TR-K CLV 875-125 MG TA: 875-125 | 7 days supply | Qty: 14 | Fill #0

## 2017-06-24 NOTE — Progress Notes (Signed)

## 2017-06-26 MED FILL — ELETRIPTAN HBR 40 MG TABLET: 40 | 30 days supply | Qty: 9 | Fill #2

## 2017-07-10 MED FILL — SUMATRIPTAN SUCC 100 MG TAB: 100 | 28 days supply | Qty: 10 | Fill #4

## 2017-07-15 ENCOUNTER — Other Ambulatory Visit: Payer: Self-pay | Admitting: Neurology

## 2017-07-15 MED FILL — tiZANidine HCL 4 MG TABS: 4 | 30 days supply | Qty: 15 | Fill #0

## 2017-07-15 MED FILL — NORTRIPTYLINE HCL 50 MG CAP: 50 | 30 days supply | Qty: 60 | Fill #4

## 2017-07-24 MED FILL — ELETRIPTAN HBR 40 MG TABLET: 40 | 30 days supply | Qty: 9 | Fill #3

## 2017-07-27 ENCOUNTER — Ambulatory Visit: Payer: 59 | Admitting: Neurology

## 2017-07-27 ENCOUNTER — Encounter: Payer: Self-pay | Admitting: Neurology

## 2017-07-27 VITALS — BP 138/88 | HR 92 | Ht 67.0 in | Wt 183.6 lb

## 2017-07-27 DIAGNOSIS — G43709 Chronic migraine without aura, not intractable, without status migrainosus: Secondary | ICD-10-CM

## 2017-07-27 NOTE — Patient Instructions (Signed)
1.  Continue nortriptyline 100mg  and excercises given by Dr. Tamala Julian 2.  Take Relpax or sumatriptan if needed. 3.  We will get preauthorization for Botox and I will see you then for first round.
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
## 2017-08-10 DIAGNOSIS — Z1231 Encounter for screening mammogram for malignant neoplasm of breast: Secondary | ICD-10-CM | POA: Diagnosis not present

## 2017-08-12 MED FILL — LEVOTHYROXINE 88 MCG TABLET: 88 | 90 days supply | Qty: 90 | Fill #3

## 2017-08-12 MED FILL — NORTRIPTYLINE HCL 50 MG CAP: 50 | 30 days supply | Qty: 60 | Fill #5

## 2017-08-21 ENCOUNTER — Other Ambulatory Visit: Payer: Self-pay | Admitting: Neurology

## 2017-08-21 MED FILL — SUMATRIPTAN SUCC 100 MG TAB: 100 | 30 days supply | Qty: 8 | Fill #5

## 2017-08-21 MED FILL — tiZANidine HCL 4 MG TABS: 4 | 30 days supply | Qty: 15 | Fill #0

## 2017-08-30 NOTE — Progress Notes (Signed)
[name] [address] [place], [place] [address] Phone: [phone] Subjective:     CC: Headache follow-up  URK:[id number]  [name] is a 52 y.o. female coming in with complaint of cervicogenic getting.  We have seen patient multiple times.  Has been doing very well with osteopathic manipulation, home exercises, icing regimen and over-the-counter medications.  Patient was continued to have headaches.  Following up with neurology.  Getting prior authorization for Botox injections.  On nortriptyline 100 mg.  Patient states doing very well overall.  Less than headaches a month at this time.  Patient feels that the manipulation and home exercises have been beneficial.  Patient has noticed different triggers such as dehydration as well.  Feels like she is in much more controlled than she has been.     Past Medical History:  Diagnosis Date  . History of ectopic pregnancy    04/ 2007  S/P LEFT SALPINGECTOMY  . History of kidney stones   . Hypothyroidism   . Menorrhagia   . Migraine    Past Surgical History:  Procedure Laterality Date  . DILATATION & CURETTAGE/HYSTEROSCOPY WITH MYOSURE  2016  . DILATATION & CURETTAGE/HYSTEROSCOPY WITH MYOSURE N/A 10/23/2016   Procedure: DILATATION & CURETTAGE/HYSTEROSCOPY;  Surgeon: Everlene Farrier, MD;  Location: [facility];  Service: Gynecology;  Laterality: N/A;  . DILATION AND EVACUATION  10/07/2007   retained poc  . LAPAROSCOPIC UNILATERAL SALPINGECTOMY Left 11/24/2005   Social History   Socioeconomic History  . Marital status: Widowed    Spouse name: None  . Number of children: 0  . Years of education: 61  . Highest education level: None  Social Needs  . Financial resource strain: None  . Food insecurity - worry: None  . Food insecurity - inability: None  . Transportation needs - medical: None  . Transportation needs - non-medical: None  Occupational History  . Occupation: [employer]  Tobacco Use  . Smoking status: Never Smoker  . Smokeless tobacco: Never Used  Substance and Sexual Activity  . Alcohol use: No  . Drug use: No  . Sexual activity: None  Other Topics Concern  . None  Social History Narrative   Lives at home alone   Right-handed   Caffeine: weekly   No Known Allergies Family History  Problem Relation Age of Onset  . Hypertension Mother   . Aortic aneurysm Mother   . Liver cancer Father   . Prostate cancer Brother   . Migraines Sister      Past medical history, social, surgical and family history all reviewed in electronic medical record.  No pertanent information unless stated regarding to the chief complaint.   Review of Systems:Review of systems updated and as accurate as of 08/31/17  No  visual changes, nausea, vomiting, diarrhea, constipation, dizziness, abdominal pain, skin rash, fevers, chills, night sweats, weight loss, swollen lymph nodes, body aches, joint swelling, chest pain, shortness of breath, mood changes.  Positive headache and muscle aches  Objective  Blood pressure 130/80, pulse 98, height 5\' 7"  (1.702 m), weight 188 lb (85.3 kg), SpO2 98 %. Systems examined below as of 08/31/17   General: No apparent distress alert and oriented x3 mood and affect normal, dressed appropriately.  HEENT: Pupils equal, extraocular movements intact  Respiratory: Patient's speak in full sentences and does not appear short of breath  Cardiovascular: No lower extremity edema, non tender, no erythema  Skin: Warm dry intact with  no signs of infection or rash on extremities or on axial skeleton.  Abdomen: Soft nontender  Neuro: Cranial nerves II through XII are intact, neurovascularly intact in all extremities with 2+ DTRs and 2+ pulses.  Lymph: No lymphadenopathy of posterior or anterior cervical chain or axillae bilaterally.  Gait normal with good balance and coordination.  MSK:  Non tender with full range of motion and good  stability and symmetric strength and tone of shoulders, elbows, wrist, hip, knee and ankles bilaterally.  Neck: Inspection: Loss of lordosis increased kyphosis of the upper back. No palpable stepoffs. Negative Spurling's maneuver. Mild limitation and right-sided side bending and rotation by less than 5 degrees Grip strength and sensation normal in bilateral hands Strength good C4 to T1 distribution No sensory change to C4 to T1 Negative Hoffman sign bilaterally Reflexes normal   Osteopathic findings C2 flexed rotated and side bent right C4 flexed rotated and side bent left C6 flexed rotated and side bent left T3 extended rotated and side bent right inhaled third rib T6 extended rotated and side bent left    Impression and Recommendations:     This case required medical decision making of moderate complexity.      Note: This dictation was prepared with Dragon dictation along with smaller phrase technology. Any transcriptional errors that result from this process are unintentional.

## 2017-08-31 ENCOUNTER — Ambulatory Visit: Payer: 59 | Admitting: Family Medicine

## 2017-08-31 ENCOUNTER — Encounter: Payer: Self-pay | Admitting: Family Medicine

## 2017-08-31 VITALS — BP 130/80 | HR 98 | Ht 67.0 in | Wt 188.0 lb

## 2017-08-31 DIAGNOSIS — G4486 Cervicogenic headache: Secondary | ICD-10-CM

## 2017-08-31 DIAGNOSIS — M999 Biomechanical lesion, unspecified: Secondary | ICD-10-CM

## 2017-08-31 DIAGNOSIS — R51 Headache: Secondary | ICD-10-CM

## 2017-08-31 MED ORDER — VITAMIN D (ERGOCALCIFEROL) 1.25 MG (50000 UNIT) PO CAPS: 50000.0000 [IU] | ORAL_CAPSULE | ORAL | 0 refills | Status: DC

## 2017-08-31 MED FILL — VIT D2 1.25 MG (50,000 UNIT: 1.25 MG | 84 days supply | Qty: 12 | Fill #0

## 2017-08-31 NOTE — Assessment & Plan Note (Signed)
Decision today to treat with OMT was based on Physical Exam  After verbal consent patient was treated with HVLA, ME, FPR techniques in cervical, thoracic, lumbar and sacral areas  Patient tolerated the procedure well with improvement in symptoms  Patient given exercises, stretches and lifestyle modifications  See medications in patient instructions if given  Patient will follow up in 1-2 weeks 

## 2017-08-31 NOTE — Patient Instructions (Addendum)
Good to see you  [name] is your friend.  Stay active I am impressed  Once weekly vitamin D for 12 weeks.  See me again in 3 months!

## 2017-08-31 NOTE — Assessment & Plan Note (Signed)
Patient has done remarkably well overall.  I do agree with the potential for Botox.  This could be beneficial.  Patient has done very well but was doing a little bit better when she was on the once weekly vitamin D which was prescribed again today.  We discussed icing regimen, posture and ergonomics.  Patient states that her job at work will cause her to have more physical activity which she is excited about because she does feel that the sedentary lifestyle seems to make it worse.  Follow-up with me again 12 weeks

## 2017-09-03 ENCOUNTER — Telehealth: Payer: Self-pay | Admitting: Neurology

## 2017-09-03 NOTE — Telephone Encounter (Signed)
Patient called regarding needing Prior Auth on her medication Relpax. Thanks

## 2017-09-04 NOTE — Telephone Encounter (Signed)
Starting [employer]

## 2017-09-07 MED FILL — ELETRIPTAN HBR 40 MG TABLET: 40 | 30 days supply | Qty: 10 | Fill #0

## 2017-09-07 NOTE — Telephone Encounter (Signed)
Pt called asking about eletriptan auth (key)[id number], and Botox (key) [id number] auth, advsd her was started on cover my meds, will let her know when I receive determinations.

## 2017-09-08 NOTE — Telephone Encounter (Signed)
Rcvd approval for eletriptan, called [facility], LM on VM advising ok to fill

## 2017-09-08 NOTE — Telephone Encounter (Signed)
Approval for eletriptan 40mg  valid for 12 refills 09/07/17 - 09/06/2018, for up to 24 tabs per month

## 2017-09-09 ENCOUNTER — Other Ambulatory Visit: Payer: Self-pay

## 2017-09-09 MED FILL — NORTRIPTYLINE HCL 50 MG CAP: 50 | 30 days supply | Qty: 60 | Fill #6

## 2017-09-09 NOTE — Progress Notes (Signed)
Rcvd Botox approval for 4 fills valid 09/09/17 - 09/08/2018 Approved for 1,  200u vial per 84 days. Must be filled thru specialty pharmacy, which is [facility], called and LM on VM for Botox Rx. Called Pt and advsd her, she wanted to know what her financial responsibility will be, advsd her to call the pharmacy tomorrow to find out. PA reference # [id number]

## 2017-09-11 NOTE — Telephone Encounter (Signed)
Rcvd fax from [place] asking for PA for eletriptan, called pharmacy, had to leave VM, advsd PA already processed and apprvd, gave dates and quantity

## 2017-09-16 ENCOUNTER — Telehealth: Payer: Self-pay | Admitting: Neurology

## 2017-09-16 NOTE — Telephone Encounter (Signed)
Dr Tomi Likens-  Pt was approved for up to #24 per month eletriptan. She has been receiving #10. I was instructed #10-12 per month are max dosing for triptans. Pls advise

## 2017-09-16 NOTE — Telephone Encounter (Signed)
Patient called and asked could she have 24 pills in a month? Please Call. Thanks

## 2017-09-17 NOTE — Telephone Encounter (Signed)
I would not exceed 12 per month

## 2017-09-17 NOTE — Telephone Encounter (Signed)
Called Pt, LM on VM advisig maxium recommended 30 day amount of eletriptan is #12

## 2017-09-21 NOTE — Progress Notes (Signed)
Called Pt, LM on VM. Verifying she contacted [facility] about Botox and that we're good for 11/05/17 Botox

## 2017-09-23 ENCOUNTER — Telehealth: Payer: Self-pay | Admitting: Neurology

## 2017-09-23 ENCOUNTER — Other Ambulatory Visit: Payer: Self-pay | Admitting: Neurology

## 2017-09-23 NOTE — Telephone Encounter (Signed)
Pt called and does have everything for the botox and wanted to let you know

## 2017-09-24 NOTE — Telephone Encounter (Signed)
Lm for Pt to rtrn my call. Need to know how many of the eletriptan she has taken. Recommended triptan use is #12 per month.

## 2017-09-24 NOTE — Telephone Encounter (Signed)
She is needing to have her Sumatriptan filled. The medication has been filled by another Doctor. She has no more medication left. Please Call. Thanks

## 2017-09-24 NOTE — Progress Notes (Signed)
Pt rtrnd call, LM that she had everything for Botox visit

## 2017-09-25 NOTE — Telephone Encounter (Signed)
She can get the full 24 eletriptan but then she cannot get sumatriptan.

## 2017-09-25 NOTE — Telephone Encounter (Signed)
Pt LM on my VM. She stated she has always had the sumatriptan for when she ran out of eletriptan, and it's never been a problem before. States she was only able to get #10 eletriptan this month. I rcvd an approval for her eletriptan for up to #24 per month. Pt is aware and asked for the full #24, we advsd her not to exceed #12 triptans a month. Should she also be receiving sumatriptan?

## 2017-09-28 NOTE — Telephone Encounter (Signed)
Pt called office, said she spoke with the pjharmacy and I need to send in new Rx. Advsd her I will call pharmacy and call her back. Called Pharmacy, spoke with [facility], she apologized for the mix up and took verbal. Called Pt and advsd, LM on VM

## 2017-09-28 NOTE — Telephone Encounter (Signed)
[facility] called for refill for sumatriptan, advsd to fill eletriptan #24 with 3 refills. Called Pt, LM on VM advising.

## 2017-10-01 DIAGNOSIS — N920 Excessive and frequent menstruation with regular cycle: Secondary | ICD-10-CM | POA: Diagnosis not present

## 2017-10-01 MED FILL — TRANEXAMIC ACID 650 MG TAB: 650 | 5 days supply | Qty: 30 | Fill #0

## 2017-10-01 MED FILL — ELETRIPTAN HBR 40 MG TABLET: 40 | 30 days supply | Qty: 24 | Fill #0

## 2017-10-05 MED FILL — FERRALET 90 DUAL-IRON TAB: 90-1 | 30 days supply | Qty: 30 | Fill #0

## 2017-10-09 DIAGNOSIS — N924 Excessive bleeding in the premenopausal period: Secondary | ICD-10-CM | POA: Diagnosis not present

## 2017-10-09 DIAGNOSIS — D251 Intramural leiomyoma of uterus: Secondary | ICD-10-CM | POA: Diagnosis not present

## 2017-10-09 MED FILL — PROGESTERONE 200 MG CAPSULE: 200 | 14 days supply | Qty: 14 | Fill #0

## 2017-10-12 MED FILL — NORTRIPTYLINE HCL 50 MG CAP: 50 | 30 days supply | Qty: 60 | Fill #7

## 2017-10-15 ENCOUNTER — Telehealth: Payer: Self-pay | Admitting: Neurology

## 2017-10-15 NOTE — Telephone Encounter (Signed)
[name] will bring the Botox to her appointment on 11/05/2017. She had a question about her surgery after the Botox injection which will be in May. The next injection will be in June and she will be recuperating from major surgery during June. Will it be alright if she does not have the 2nd injection at 3 months? If surgery causes the migraines to go away is it ok to have 1 injection then stop?

## 2017-10-16 NOTE — Telephone Encounter (Signed)
Botox is an ongoing treatment (not for just one injection).  It also must be performed consistently about every 3 months.

## 2017-10-20 MED FILL — PROGESTERONE 200 MG CAPSULE: 200 | 14 days supply | Qty: 14 | Fill #1

## 2017-10-21 ENCOUNTER — Telehealth: Payer: 59 | Admitting: Nurse Practitioner

## 2017-10-21 DIAGNOSIS — J029 Acute pharyngitis, unspecified: Secondary | ICD-10-CM

## 2017-10-21 MED ORDER — AMOXICILLIN-POT CLAVULANATE 875-125 MG PO TABS: 1.0000 | ORAL_TABLET | Freq: Two times a day (BID) | ORAL | 0 refills | Status: DC

## 2017-10-21 MED FILL — AMOX TR-K CLV 875-125 MG TA: 875-125 | 7 days supply | Qty: 14 | Fill #0

## 2017-10-21 NOTE — Progress Notes (Signed)

## 2017-10-22 ENCOUNTER — Telehealth: Payer: Self-pay | Admitting: *Deleted

## 2017-10-22 NOTE — Telephone Encounter (Signed)
[name] will cancel 11/05/17 Botox 1st injection appointment and call us after her surgery to start Botox injections if her migraines start up again. She said she is hoping the surgery will help because her headaches are very closely related to hormonal changes. Also she does not want to start these injections then not know if it was the Botox or the surgery which helped. She will call us to schedule injections at a later time if needed.

## 2017-10-28 MED FILL — ELETRIPTAN HBR 40 MG TABLET: 40 | 30 days supply | Qty: 24 | Fill #1

## 2017-11-04 MED FILL — LEVOTHYROXINE 88 MCG TABLET: 88 | 90 days supply | Qty: 90 | Fill #4

## 2017-11-04 MED FILL — FERRALET 90 DUAL-IRON TAB: 90-1 | 30 days supply | Qty: 30 | Fill #1

## 2017-11-05 ENCOUNTER — Ambulatory Visit: Payer: 59 | Admitting: Neurology

## 2017-11-09 MED FILL — TRANEXAMIC ACID 650 MG TAB: 650 | 5 days supply | Qty: 30 | Fill #0

## 2017-11-10 MED FILL — NORTRIPTYLINE HCL 50 MG CAP: 50 | 30 days supply | Qty: 60 | Fill #8

## 2017-11-26 DIAGNOSIS — Z0000 Encounter for general adult medical examination without abnormal findings: Secondary | ICD-10-CM | POA: Diagnosis not present

## 2017-11-30 ENCOUNTER — Ambulatory Visit: Payer: 59 | Admitting: Family Medicine

## 2017-12-03 MED FILL — ELETRIPTAN HBR 40 MG TABLET: 40 | 30 days supply | Qty: 24 | Fill #2

## 2017-12-05 ENCOUNTER — Emergency Department (HOSPITAL_COMMUNITY): Payer: 59 | Admitting: Certified Registered"

## 2017-12-05 ENCOUNTER — Observation Stay (HOSPITAL_COMMUNITY)
Admission: EM | Admit: 2017-12-05 | Discharge: 2017-12-07 | Disposition: A | Discharge status: Home / Self Care | Payer: 59 | Attending: Orthopedic Surgery | Admitting: Orthopedic Surgery

## 2017-12-05 ENCOUNTER — Ambulatory Visit (HOSPITAL_COMMUNITY): Payer: 59 | Admitting: Certified Registered"

## 2017-12-05 ENCOUNTER — Ambulatory Visit: Admit: 2017-12-05 | Payer: 59 | Admitting: Orthopedic Surgery

## 2017-12-05 ENCOUNTER — Encounter (HOSPITAL_COMMUNITY): Admission: EM | Disposition: A | Discharge status: Home / Self Care | Payer: Self-pay | Source: Home / Self Care

## 2017-12-05 ENCOUNTER — Encounter (HOSPITAL_COMMUNITY): Payer: Self-pay | Admitting: Family Medicine

## 2017-12-05 ENCOUNTER — Ambulatory Visit (INDEPENDENT_AMBULATORY_CARE_PROVIDER_SITE_OTHER): Payer: 59

## 2017-12-05 ENCOUNTER — Other Ambulatory Visit: Payer: Self-pay

## 2017-12-05 ENCOUNTER — Encounter (HOSPITAL_COMMUNITY)
Admission: EM | Disposition: A | Discharge status: Home / Self Care | Payer: Self-pay | Source: Home / Self Care | Attending: Family Medicine

## 2017-12-05 ENCOUNTER — Encounter (HOSPITAL_COMMUNITY): Payer: Self-pay

## 2017-12-05 ENCOUNTER — Ambulatory Visit (HOSPITAL_COMMUNITY)
Admission: EM | Admit: 2017-12-05 | Discharge: 2017-12-05 | Disposition: A | Discharge status: Home / Self Care | Payer: 59 | Source: Home / Self Care | Attending: Family Medicine | Admitting: Family Medicine

## 2017-12-05 DIAGNOSIS — S52572A Other intraarticular fracture of lower end of left radius, initial encounter for closed fracture: Secondary | ICD-10-CM | POA: Diagnosis not present

## 2017-12-05 DIAGNOSIS — S52502A Unspecified fracture of the lower end of left radius, initial encounter for closed fracture: Secondary | ICD-10-CM

## 2017-12-05 DIAGNOSIS — W01198A Fall on same level from slipping, tripping and stumbling with subsequent striking against other object, initial encounter: Secondary | ICD-10-CM | POA: Diagnosis not present

## 2017-12-05 DIAGNOSIS — G959 Disease of spinal cord, unspecified: Secondary | ICD-10-CM | POA: Diagnosis not present

## 2017-12-05 DIAGNOSIS — S52122A Displaced fracture of head of left radius, initial encounter for closed fracture: Secondary | ICD-10-CM | POA: Diagnosis not present

## 2017-12-05 DIAGNOSIS — W08XXXA Fall from other furniture, initial encounter: Secondary | ICD-10-CM | POA: Diagnosis not present

## 2017-12-05 DIAGNOSIS — G8918 Other acute postprocedural pain: Secondary | ICD-10-CM | POA: Diagnosis not present

## 2017-12-05 DIAGNOSIS — M25532 Pain in left wrist: Secondary | ICD-10-CM | POA: Diagnosis not present

## 2017-12-05 DIAGNOSIS — Z87442 Personal history of urinary calculi: Secondary | ICD-10-CM | POA: Diagnosis not present

## 2017-12-05 DIAGNOSIS — E039 Hypothyroidism, unspecified: Secondary | ICD-10-CM | POA: Insufficient documentation

## 2017-12-05 DIAGNOSIS — S6412XA Injury of median nerve at wrist and hand level of left arm, initial encounter: Secondary | ICD-10-CM | POA: Diagnosis not present

## 2017-12-05 DIAGNOSIS — Z79899 Other long term (current) drug therapy: Secondary | ICD-10-CM | POA: Diagnosis not present

## 2017-12-05 HISTORY — PX: CARPAL TUNNEL RELEASE: SHX101

## 2017-12-05 SURGERY — OPEN REDUCTION INTERNAL FIXATION (ORIF) WRIST FRACTURE
Anesthesia: General | Laterality: Left

## 2017-12-05 SURGERY — CARPAL TUNNEL RELEASE
Anesthesia: Regional | Laterality: Left

## 2017-12-05 MED ORDER — MORPHINE SULFATE (PF) 4 MG/ML IV SOLN
INTRAVENOUS | Status: AC
  Filled 2017-12-05: qty 1

## 2017-12-05 MED ORDER — BUPIVACAINE-EPINEPHRINE (PF) 0.5% -1:200000 IJ SOLN
INTRAMUSCULAR | Status: DC | PRN
  Administered 2017-12-05: 30 mL via PERINEURAL

## 2017-12-05 MED ORDER — LACTATED RINGERS IV SOLN
INTRAVENOUS | Status: DC | PRN
  Administered 2017-12-05 (×2): via INTRAVENOUS

## 2017-12-05 MED ORDER — DOCUSATE SODIUM 100 MG PO CAPS
100.0000 mg | ORAL_CAPSULE | Freq: Two times a day (BID) | ORAL | Status: DC
  Administered 2017-12-06: 100 mg via ORAL
  Filled 2017-12-05 (×3): qty 1

## 2017-12-05 MED ORDER — DEXAMETHASONE SODIUM PHOSPHATE 10 MG/ML IJ SOLN
INTRAMUSCULAR | Status: AC
  Filled 2017-12-05: qty 1

## 2017-12-05 MED ORDER — SUFENTANIL CITRATE 50 MCG/ML IV SOLN
INTRAVENOUS | Status: AC
  Filled 2017-12-05: qty 1

## 2017-12-05 MED ORDER — SODIUM CHLORIDE 0.9 % IJ SOLN
INTRAMUSCULAR | Status: AC
  Filled 2017-12-05: qty 10

## 2017-12-05 MED ORDER — SUCCINYLCHOLINE CHLORIDE 200 MG/10ML IV SOSY
PREFILLED_SYRINGE | INTRAVENOUS | Status: AC
  Filled 2017-12-05: qty 10

## 2017-12-05 MED ORDER — ONDANSETRON HCL 4 MG/2ML IJ SOLN
INTRAMUSCULAR | Status: AC
  Filled 2017-12-05: qty 2

## 2017-12-05 MED ORDER — ONDANSETRON HCL 4 MG/2ML IJ SOLN
INTRAMUSCULAR | Status: DC | PRN
  Administered 2017-12-05: 4 mg via INTRAVENOUS

## 2017-12-05 MED ORDER — OXYCODONE HCL 5 MG PO TABS
5.0000 mg | ORAL_TABLET | ORAL | Status: DC | PRN
  Administered 2017-12-05 – 2017-12-07 (×8): 10 mg via ORAL
  Filled 2017-12-05 (×8): qty 2

## 2017-12-05 MED ORDER — FAMOTIDINE 20 MG PO TABS: 20.0000 mg | ORAL_TABLET | Freq: Two times a day (BID) | ORAL | Status: DC | PRN

## 2017-12-05 MED ORDER — 0.9 % SODIUM CHLORIDE (POUR BTL) OPTIME
TOPICAL | Status: DC | PRN
  Administered 2017-12-05: 1000 mL

## 2017-12-05 MED ORDER — PHENYLEPHRINE HCL 10 MG/ML IJ SOLN
INTRAVENOUS | Status: DC | PRN
  Administered 2017-12-05: 100 ug/min via INTRAVENOUS

## 2017-12-05 MED ORDER — HYDROMORPHONE HCL 2 MG/ML IJ SOLN
0.5000 mg | INTRAMUSCULAR | Status: DC | PRN
  Administered 2017-12-06: 1 mg via INTRAVENOUS
  Filled 2017-12-05: qty 1

## 2017-12-05 MED ORDER — VITAMIN C 500 MG PO TABS
1000.0000 mg | ORAL_TABLET | Freq: Every day | ORAL | Status: DC
  Administered 2017-12-06 – 2017-12-07 (×2): 1000 mg via ORAL
  Filled 2017-12-05 (×2): qty 2

## 2017-12-05 MED ORDER — CEFAZOLIN SODIUM-DEXTROSE 1-4 GM/50ML-% IV SOLN
1.0000 g | Freq: Three times a day (TID) | INTRAVENOUS | Status: DC
  Administered 2017-12-06 – 2017-12-07 (×4): 1 g via INTRAVENOUS
  Filled 2017-12-05 (×5): qty 50

## 2017-12-05 MED ORDER — MORPHINE SULFATE (PF) 2 MG/ML IV SOLN
2.0000 mg | Freq: Once | INTRAVENOUS | Status: AC
  Administered 2017-12-05: 2 mg via INTRAMUSCULAR

## 2017-12-05 MED ORDER — CEFAZOLIN SODIUM-DEXTROSE 2-3 GM-%(50ML) IV SOLR
INTRAVENOUS | Status: DC | PRN
  Administered 2017-12-05: 2 g via INTRAVENOUS

## 2017-12-05 MED ORDER — PROPOFOL 10 MG/ML IV BOLUS
INTRAVENOUS | Status: AC
  Filled 2017-12-05: qty 20

## 2017-12-05 MED ORDER — PROPOFOL 10 MG/ML IV BOLUS
INTRAVENOUS | Status: DC | PRN
  Administered 2017-12-05: 150 mg via INTRAVENOUS

## 2017-12-05 MED ORDER — LACTATED RINGERS IV SOLN
INTRAVENOUS | Status: DC
  Administered 2017-12-06: via INTRAVENOUS

## 2017-12-05 MED ORDER — MIDAZOLAM HCL 5 MG/5ML IJ SOLN
INTRAMUSCULAR | Status: DC | PRN
  Administered 2017-12-05: 2 mg via INTRAVENOUS

## 2017-12-05 MED ORDER — ONDANSETRON HCL 4 MG PO TABS: 4.0000 mg | ORAL_TABLET | Freq: Four times a day (QID) | ORAL | Status: DC | PRN

## 2017-12-05 MED ORDER — EPHEDRINE SULFATE 50 MG/ML IJ SOLN
INTRAMUSCULAR | Status: DC | PRN
  Administered 2017-12-05: 10 mg via INTRAVENOUS

## 2017-12-05 MED ORDER — PHENYLEPHRINE 40 MCG/ML (10ML) SYRINGE FOR IV PUSH (FOR BLOOD PRESSURE SUPPORT)
PREFILLED_SYRINGE | INTRAVENOUS | Status: AC
  Filled 2017-12-05: qty 10

## 2017-12-05 MED ORDER — SUCCINYLCHOLINE CHLORIDE 200 MG/10ML IV SOSY
PREFILLED_SYRINGE | INTRAVENOUS | Status: DC | PRN
  Administered 2017-12-05: 80 mg via INTRAVENOUS

## 2017-12-05 MED ORDER — CEFAZOLIN SODIUM-DEXTROSE 1-4 GM/50ML-% IV SOLN
1.0000 g | INTRAVENOUS | Status: AC
  Administered 2017-12-06: 1 g via INTRAVENOUS
  Filled 2017-12-05: qty 50

## 2017-12-05 MED ORDER — MIDAZOLAM HCL 2 MG/2ML IJ SOLN
INTRAMUSCULAR | Status: AC
  Filled 2017-12-05: qty 2

## 2017-12-05 MED ORDER — PHENYLEPHRINE HCL 10 MG/ML IJ SOLN
INTRAMUSCULAR | Status: DC | PRN
  Administered 2017-12-05: 120 ug via INTRAVENOUS
  Administered 2017-12-05: 200 ug via INTRAVENOUS
  Administered 2017-12-05: 80 ug via INTRAVENOUS

## 2017-12-05 MED ORDER — SUFENTANIL CITRATE 50 MCG/ML IV SOLN
INTRAVENOUS | Status: DC | PRN
  Administered 2017-12-05 (×2): 10 ug via INTRAVENOUS

## 2017-12-05 MED ORDER — FENTANYL CITRATE (PF) 100 MCG/2ML IJ SOLN: 25.0000 ug | INTRAMUSCULAR | Status: DC | PRN

## 2017-12-05 MED ORDER — ONDANSETRON HCL 4 MG/2ML IJ SOLN: 4.0000 mg | Freq: Four times a day (QID) | INTRAMUSCULAR | Status: DC | PRN

## 2017-12-05 MED ORDER — LEVOTHYROXINE SODIUM 88 MCG PO TABS
88.0000 ug | ORAL_TABLET | Freq: Every day | ORAL | Status: DC
  Administered 2017-12-06 – 2017-12-07 (×2): 88 ug via ORAL
  Filled 2017-12-05 (×2): qty 1

## 2017-12-05 MED ORDER — DEXAMETHASONE SODIUM PHOSPHATE 10 MG/ML IJ SOLN
INTRAMUSCULAR | Status: DC | PRN
  Administered 2017-12-05: 10 mg via INTRAVENOUS

## 2017-12-05 MED ORDER — METHOCARBAMOL 500 MG PO TABS
500.0000 mg | ORAL_TABLET | Freq: Four times a day (QID) | ORAL | Status: DC | PRN
  Administered 2017-12-05 – 2017-12-07 (×6): 500 mg via ORAL
  Filled 2017-12-05 (×6): qty 1

## 2017-12-05 MED ORDER — CEFAZOLIN SODIUM 1 G IJ SOLR
INTRAMUSCULAR | Status: AC
  Filled 2017-12-05: qty 20

## 2017-12-05 MED ORDER — PROMETHAZINE HCL 12.5 MG RE SUPP
12.5000 mg | Freq: Four times a day (QID) | RECTAL | Status: DC | PRN
Start: 2017-12-05 — End: 2017-12-07
  Filled 2017-12-05: qty 1

## 2017-12-05 MED ORDER — METHOCARBAMOL 1000 MG/10ML IJ SOLN
500.0000 mg | Freq: Four times a day (QID) | INTRAVENOUS | Status: DC | PRN
  Filled 2017-12-05: qty 5

## 2017-12-05 SURGICAL SUPPLY — 69 items
BANDAGE ACE 3X5.8 VEL STRL LF (GAUZE/BANDAGES/DRESSINGS) ×2 IMPLANT
BANDAGE ACE 4X5 VEL STRL LF (GAUZE/BANDAGES/DRESSINGS) ×2 IMPLANT
BANDAGE ELASTIC 3 VELCRO ST LF (GAUZE/BANDAGES/DRESSINGS) ×1 IMPLANT
BANDAGE ELASTIC 4 VELCRO ST LF (GAUZE/BANDAGES/DRESSINGS) ×1 IMPLANT
BIT DRILL 2.2 SS TIBIAL (BIT) ×2 IMPLANT
BLADE CLIPPER SURG (BLADE) IMPLANT
BLADE SURG 15 STRL LF DISP TIS (BLADE) IMPLANT
BLADE SURG 15 STRL SS (BLADE) ×2
BNDG CMPR 9X4 STRL LF SNTH (GAUZE/BANDAGES/DRESSINGS) ×1
BNDG ESMARK 4X9 LF (GAUZE/BANDAGES/DRESSINGS) ×2 IMPLANT
BNDG GAUZE ELAST 4 BULKY (GAUZE/BANDAGES/DRESSINGS) ×4 IMPLANT
CANISTER SUCT 3000ML PPV (MISCELLANEOUS) ×2 IMPLANT
CORDS BIPOLAR (ELECTRODE) ×2 IMPLANT
COVER SURGICAL LIGHT HANDLE (MISCELLANEOUS) ×2 IMPLANT
CUFF TOURNIQUET SINGLE 18IN (TOURNIQUET CUFF) ×2 IMPLANT
CUFF TOURNIQUET SINGLE 24IN (TOURNIQUET CUFF) IMPLANT
DRAPE OEC MINIVIEW 54X84 (DRAPES) IMPLANT
DRAPE SURG 17X23 STRL (DRAPES) ×2 IMPLANT
DRSG ADAPTIC 3X8 NADH LF (GAUZE/BANDAGES/DRESSINGS) ×1 IMPLANT
DRSG MEPITEL 4X7.2 (GAUZE/BANDAGES/DRESSINGS) ×1 IMPLANT
GAUZE SPONGE 4X4 12PLY STRL (GAUZE/BANDAGES/DRESSINGS) ×2 IMPLANT
GAUZE SPONGE 4X4 12PLY STRL LF (GAUZE/BANDAGES/DRESSINGS) ×1 IMPLANT
GAUZE XEROFORM 1X8 LF (GAUZE/BANDAGES/DRESSINGS) ×2 IMPLANT
GLOVE BIOGEL M 8.0 STRL (GLOVE) ×2 IMPLANT
GLOVE SS BIOGEL STRL SZ 8 (GLOVE) ×1 IMPLANT
GLOVE SUPERSENSE BIOGEL SZ 8 (GLOVE) ×1
GOWN STRL REUS W/ TWL LRG LVL3 (GOWN DISPOSABLE) ×1 IMPLANT
GOWN STRL REUS W/ TWL XL LVL3 (GOWN DISPOSABLE) ×2 IMPLANT
GOWN STRL REUS W/TWL LRG LVL3 (GOWN DISPOSABLE) ×2
GOWN STRL REUS W/TWL XL LVL3 (GOWN DISPOSABLE) ×4
KIT BASIN OR (CUSTOM PROCEDURE TRAY) ×2 IMPLANT
KIT TURNOVER KIT B (KITS) ×2 IMPLANT
LOOP VESSEL MAXI BLUE (MISCELLANEOUS) IMPLANT
MANIFOLD NEPTUNE II (INSTRUMENTS) ×2 IMPLANT
NEEDLE 22X1 1/2 (OR ONLY) (NEEDLE) IMPLANT
NS IRRIG 1000ML POUR BTL (IV SOLUTION) ×2 IMPLANT
PACK ORTHO EXTREMITY (CUSTOM PROCEDURE TRAY) ×2 IMPLANT
PAD ARMBOARD 7.5X6 YLW CONV (MISCELLANEOUS) ×4 IMPLANT
PAD CAST 3X4 CTTN HI CHSV (CAST SUPPLIES) ×1 IMPLANT
PAD CAST 4YDX4 CTTN HI CHSV (CAST SUPPLIES) ×1 IMPLANT
PADDING CAST COTTON 3X4 STRL (CAST SUPPLIES) ×2
PADDING CAST COTTON 4X4 STRL (CAST SUPPLIES) ×2
PEG LOCKING SMOOTH 2.2X18 (Peg) ×2 IMPLANT
PEG LOCKING SMOOTH 2.2X20 (Screw) ×4 IMPLANT
PEG LOCKING SMOOTH 2.2X22 (Screw) ×1 IMPLANT
PILLOW ABDUCTION HIP (SOFTGOODS) ×1 IMPLANT
PILLOW ARM CARTER ADULT (MISCELLANEOUS) ×1 IMPLANT
PLATE LONG DVR LEFT (Plate) ×1 IMPLANT
PUTTY DBM STAGRAFT PLUS 5CC (Putty) ×1 IMPLANT
SCREW LOCK 12X2.7X 3 LD (Screw) IMPLANT
SCREW LOCK 14X2.7X 3 LD TPR (Screw) IMPLANT
SCREW LOCKING 2.7X12MM (Screw) ×6 IMPLANT
SCREW LOCKING 2.7X13MM (Screw) ×2 IMPLANT
SCREW LOCKING 2.7X14 (Screw) ×8 IMPLANT
SCRUB BETADINE 4OZ XXX (MISCELLANEOUS) ×2 IMPLANT
SOL PREP POV-IOD 4OZ 10% (MISCELLANEOUS) ×2 IMPLANT
SPLINT FIBERGLASS 3X35 (CAST SUPPLIES) ×1 IMPLANT
SPONGE LAP 4X18 X RAY DECT (DISPOSABLE) IMPLANT
SUT MNCRL AB 4-0 PS2 18 (SUTURE) ×2 IMPLANT
SUT PROLENE 3 0 PS 2 (SUTURE) ×2 IMPLANT
SUT PROLENE 4 0 PS 2 18 (SUTURE) ×3 IMPLANT
SUT VIC AB 3-0 FS2 27 (SUTURE) ×1 IMPLANT
SYR CONTROL 10ML LL (SYRINGE) IMPLANT
SYSTEM CHEST DRAIN TLS 7FR (DRAIN) ×1 IMPLANT
TOWEL OR 17X24 6PK STRL BLUE (TOWEL DISPOSABLE) ×2 IMPLANT
TOWEL OR 17X26 10 PK STRL BLUE (TOWEL DISPOSABLE) ×2 IMPLANT
TUBE CONNECTING 12X1/4 (SUCTIONS) ×2 IMPLANT
TUBE EVACUATION TLS (MISCELLANEOUS) ×2 IMPLANT
WATER STERILE IRR 1000ML POUR (IV SOLUTION) ×2 IMPLANT

## 2017-12-05 NOTE — Transfer of Care (Signed)
Immediate Anesthesia Transfer of Care Note  Patient: [name]  Procedure(s) Performed: OPEN REDUCTION INTERNAL FIXATION (ORIF) WRIST FRACTURE WITH CARPAL TUNNEL RELEASE (Left )  Patient Location: PACU  Anesthesia Type:GA combined with regional for post-op pain  Level of Consciousness: awake, oriented, drowsy and patient cooperative  Airway & Oxygen Therapy: Patient Spontanous Breathing and Patient connected to nasal cannula oxygen  Post-op Assessment: Report given to RN, Post -op Vital signs reviewed and stable, Patient moving all extremities and except for left arm due to blk  Post vital signs: Reviewed  Last Vitals:  Vitals Value Taken Time  BP 140/85 12/05/2017 10:30 PM  Temp    Pulse 110 12/05/2017 10:31 PM  Resp 17 12/05/2017 10:31 PM  SpO2 100 % 12/05/2017 10:31 PM  Vitals shown include unvalidated device data.  Last Pain:  Vitals:   12/05/17 1919  TempSrc: Oral  PainSc:          Complications: No apparent anesthesia complications

## 2017-12-05 NOTE — Anesthesia Preprocedure Evaluation (Addendum)
Anesthesia Evaluation  Patient identified by MRN, date of birth, ID band Patient awake    Reviewed: Allergy & Precautions, NPO status , Patient's Chart, lab work & pertinent test results  History of Anesthesia Complications Negative for: history of anesthetic complications  Airway Mallampati: II  TM Distance: >3 FB Neck ROM: Full    Dental  (+) Teeth Intact   Pulmonary neg pulmonary ROS,    breath sounds clear to auscultation       Cardiovascular negative cardio ROS   Rhythm:Regular     Neuro/Psych  Headaches, negative psych ROS   GI/Hepatic negative GI ROS, Neg liver ROS,   Endo/Other  Hypothyroidism   Renal/GU      Musculoskeletal Left wrist fx   Abdominal   Peds  Hematology negative hematology ROS (+)   Anesthesia Other Findings   Reproductive/Obstetrics                             Anesthesia Physical Anesthesia Plan  ASA: II  Anesthesia Plan: General and Regional   Post-op Pain Management:  Regional for Post-op pain   Induction: Intravenous, Rapid sequence and Cricoid pressure planned  PONV Risk Score and Plan: 3 and Ondansetron and Dexamethasone  Airway Management Planned: Oral ETT  Additional Equipment: None  Intra-op Plan:   Post-operative Plan: Extubation in OR  Informed Consent: I have reviewed the patients History and Physical, chart, labs and discussed the procedure including the risks, benefits and alternatives for the proposed anesthesia with the patient or authorized representative who has indicated his/her understanding and acceptance.   Dental advisory given  Plan Discussed with: CRNA and Surgeon  Anesthesia Plan Comments:         Anesthesia Quick Evaluation

## 2017-12-05 NOTE — ED Provider Notes (Addendum)
[place]   [id number] 12/05/17 Arrival Time: 5956  ASSESSMENT & PLAN:  1. Closed fracture of distal end of left radius, unspecified fracture morphology, initial encounter    Meds ordered this encounter  Medications  . morphine 2 MG/ML injection 2 mg   Dr. Amedeo Plenty to admit to OR for ORIF this evening. IV placed here. Ms. Northrop taken to the ED for registration and holding until OR ready. Escorted to the ED by RN.  Reviewed expectations re: course of current medical issues. Questions answered. Outlined signs and symptoms indicating need for more acute intervention. Patient verbalized understanding. After Visit Summary given.  SUBJECTIVE: History from: patient. [name] is a 52 y.o. female who reports persistent pain of her L wrist with swelling. Onset abrupt beginning a few hours ago. Injury/trama: yes, reports falling from small stool today and hitting L wrist/arm on a planter at home. Discomfort described as aching without radiation; much worse with any movement. Extremity sensation changes or weakness: none. Self treatment: has not tried OTCs for relief of pain. Last ate just over 4 hours ago.  ROS: As per HPI. All other systems negative.   OBJECTIVE:  Vitals:   12/05/17 1654 12/05/17 1655  BP:  (!) 155/116  Pulse:  97  Temp: 98.2 F (36.8 C) 98.2 F (36.8 C)  TempSrc: Oral Oral  SpO2:  97%    General appearance: alert; no distress HEENT: no signs of trauma Neck: FROM Extremities: obvious gross deformity of L distal forearm/wrist; diffuse and significant tenderness over this area that limits exam with moderate swelling and some bruising CV: normal extremity capillary refill Skin: warm and dry Neurologic: normal symmetric reflexes in all extremities; normal sensation in all extremities Psychological: alert and cooperative; normal mood and affect  Imaging: Dg Wrist Complete Left  Result Date: 12/05/2017 CLINICAL DATA:  Pt fell earlier today and  impacted ulnar aspect of wrist on a rock. Very limited RoM, obvious deformity. No previous injures to area, Nondiabetic. EXAM: LEFT WRIST - COMPLETE 3+ VIEW COMPARISON:  None. FINDINGS: There is a comminuted, impacted and dorsally angulated fracture of the distal radius. There is a transverse fracture component across the metaphysis. Additional fractures extend to the radiocarpal articular surface. The articular surface of the distal radius is also angled posteriorly by 60 degrees. Comminuted fracture components along the dorsal aspect of the distal radius mildly displaced dorsally. There is additional oblique fracture extending from the metaphysis to the distal diaphysis, with a single comminuted fracture component. On the lateral view, the distal portion of this fracture is displaced in a volar direction by 7 mm. There is no dislocation. The impacted ulnar fracture leads to a significant ulnar positive variance. Surrounding soft tissue swelling. IMPRESSION: 1. Comminuted, impacted and displaced fractures of the distal radius as described as well as dorsal angulation of the distal radial articular surface of 60 degrees. No dislocation. Electronically Signed   By: Lajean Manes M.D.   On: 12/05/2017 17:51    No Known Allergies  Past Medical History:  Diagnosis Date  . History of ectopic pregnancy    04/ 2007  S/P LEFT SALPINGECTOMY  . History of kidney stones   . Hypothyroidism   . Menorrhagia   . Migraine    Social History   Socioeconomic History  . Marital status: Widowed    Spouse name: Not on file  . Number of children: 0  . Years of education: 57  . Highest education level: Not on file  Occupational History  . Occupation: [employer]  Social Needs  . Financial resource strain: Not on file  . Food insecurity:    Worry: Not on file    Inability: Not on file  . Transportation needs:    Medical: Not on file    Non-medical: Not on file  Tobacco Use  . Smoking status:  Never Smoker  . Smokeless tobacco: Never Used  Substance and Sexual Activity  . Alcohol use: No  . Drug use: No  . Sexual activity: Not on file  Lifestyle  . Physical activity:    Days per week: Not on file    Minutes per session: Not on file  . Stress: Not on file  Relationships  . Social connections:    Talks on phone: Not on file    Gets together: Not on file    Attends religious service: Not on file    Active member of club or organization: Not on file    Attends meetings of clubs or organizations: Not on file    Relationship status: Not on file  . Intimate partner violence:    Fear of current or ex partner: Not on file    Emotionally abused: Not on file    Physically abused: Not on file    Forced sexual activity: Not on file  Other Topics Concern  . Not on file  Social History Narrative   Lives at home alone   Right-handed   Caffeine: weekly   Family History  Problem Relation Age of Onset  . Hypertension Mother   . Aortic aneurysm Mother   . Liver cancer Father   . Prostate cancer Brother   . Migraines Sister    Past Surgical History:  Procedure Laterality Date  . DILATATION & CURETTAGE/HYSTEROSCOPY WITH MYOSURE  2016  . DILATATION & CURETTAGE/HYSTEROSCOPY WITH MYOSURE N/A 10/23/2016   Procedure: DILATATION & CURETTAGE/HYSTEROSCOPY;  Surgeon: Everlene Farrier, MD;  Location: [facility];  Service: Gynecology;  Laterality: N/A;  . DILATION AND EVACUATION  10/07/2007   retained poc  . LAPAROSCOPIC UNILATERAL SALPINGECTOMY Left 11/24/2005      Vanessa Kick, MD 12/05/17 2585    Vanessa Kick, MD 12/05/17 Montez Morita, MD 12/05/17 [phone]

## 2017-12-05 NOTE — Op Note (Signed)
Please see full dictation#[id number]  Status post #1 open reduction internal fixation comminuted complex intra-articular distal radius fracture with long cross lock DVR plate from [employer] #2 dorsal EPL decompression and tendon transfer #3 allograft bone grafting left distal radius #4 4 view x-ray series left radius #5 Open Left Carpal Tunnel release  We will admit for IV antibiotics general postop observation   Violetta Lavalle MD

## 2017-12-05 NOTE — ED Triage Notes (Signed)
Pt states that was sent from [facility] for hand surgery who is ready for her.

## 2017-12-05 NOTE — ED Triage Notes (Signed)
Patient stated she was hanging her plants up and fell done on her left wrist not that long ago

## 2017-12-05 NOTE — Anesthesia Procedure Notes (Signed)
Procedure Name: Intubation Date/Time: 12/05/2017 8:09 PM Performed by: Claris Che, CRNA Pre-anesthesia Checklist: Patient identified, Emergency Drugs available, Suction available, Patient being monitored and Timeout performed Patient Re-evaluated:Patient Re-evaluated prior to induction Oxygen Delivery Method: Circle system utilized Preoxygenation: Pre-oxygenation with 100% oxygen Induction Type: IV induction, Rapid sequence and Cricoid Pressure applied Grade View: Grade I Tube type: Oral Tube size: 7.5 mm Number of attempts: 1 Airway Equipment and Method: Stylet Placement Confirmation: ETT inserted through vocal cords under direct vision,  positive ETCO2 and breath sounds checked- equal and bilateral Secured at: 23 cm Tube secured with: Tape Dental Injury: Teeth and Oropharynx as per pre-operative assessment

## 2017-12-05 NOTE — H&P (Signed)
[name] is an 52 y.o. female.   Chief Complaint: Comminuted complex severe left distal radius fracture sustained today after falling off a stool  HPI: Patient presents with a complex distal radius fracture left upper extremity after falling off a stool.  She is never had a prior history of fracture to the left wrist.  The right wrist has had a prior external fixator placed due to fracture in the very distant past.  She is awake alert and oriented.  She denies neck back chest or abdominal pain lower extremity examination is benign and she is ambulatory.  I reviewed all issues with her at length and the findings.  She notes no allergies.  She takes thyroid medicine.  She is here today with a friend.  Past Medical History:  Diagnosis Date  . History of ectopic pregnancy    04/ 2007  S/P LEFT SALPINGECTOMY  . History of kidney stones   . Hypothyroidism   . Menorrhagia   . Migraine     Past Surgical History:  Procedure Laterality Date  . DILATATION & CURETTAGE/HYSTEROSCOPY WITH MYOSURE  2016  . DILATATION & CURETTAGE/HYSTEROSCOPY WITH MYOSURE N/A 10/23/2016   Procedure: DILATATION & CURETTAGE/HYSTEROSCOPY;  Surgeon: Everlene Farrier, MD;  Location: [facility];  Service: Gynecology;  Laterality: N/A;  . DILATION AND EVACUATION  10/07/2007   retained poc  . LAPAROSCOPIC UNILATERAL SALPINGECTOMY Left 11/24/2005    Family History  Problem Relation Age of Onset  . Hypertension Mother   . Aortic aneurysm Mother   . Liver cancer Father   . Prostate cancer Brother   . Migraines Sister    Social History:  reports that she has never smoked. She has never used smokeless tobacco. She reports that she does not drink alcohol or use drugs.  Allergies: No Known Allergies   (Not in a hospital admission)  No results found for this or any previous visit (from the past 48 hour(s)). Dg Wrist Complete Left  Result Date: 12/05/2017 CLINICAL DATA:  Pt fell earlier today and impacted  ulnar aspect of wrist on a rock. Very limited RoM, obvious deformity. No previous injures to area, Nondiabetic. EXAM: LEFT WRIST - COMPLETE 3+ VIEW COMPARISON:  None. FINDINGS: There is a comminuted, impacted and dorsally angulated fracture of the distal radius. There is a transverse fracture component across the metaphysis. Additional fractures extend to the radiocarpal articular surface. The articular surface of the distal radius is also angled posteriorly by 60 degrees. Comminuted fracture components along the dorsal aspect of the distal radius mildly displaced dorsally. There is additional oblique fracture extending from the metaphysis to the distal diaphysis, with a single comminuted fracture component. On the lateral view, the distal portion of this fracture is displaced in a volar direction by 7 mm. There is no dislocation. The impacted ulnar fracture leads to a significant ulnar positive variance. Surrounding soft tissue swelling. IMPRESSION: 1. Comminuted, impacted and displaced fractures of the distal radius as described as well as dorsal angulation of the distal radial articular surface of 60 degrees. No dislocation. Electronically Signed   By: Lajean Manes M.D.   On: 12/05/2017 17:51    Review of Systems  Respiratory: Negative.   Cardiovascular: Negative.   Gastrointestinal: Negative.   Genitourinary: Negative.   Neurological: Negative.     Blood pressure (!) 153/103, pulse (!) 103, temperature 98.5 F (36.9 C), temperature source Oral, resp. rate 20, last menstrual period 11/07/2017, SpO2 95 %. Physical Exam The patient is  alert and oriented in no acute distress. The patient complains of pain in the affected upper extremity.  The patient is noted to have a normal HEENT exam. Lung fields show equal chest expansion and no shortness of breath. Abdomen exam is nontender without distention. Lower extremity examination does not show any fracture dislocation or blood clot symptoms. Pelvis  is stable and the neck and back are stable and nontender. Patient is sensate about the hand.  She is tender and has obvious deformity.  X-rays correlate with comminuted complex fracture about the distal radius.  This is a highly complex fracture with extension into the shaft region.  I reviewed this with her at length and discussed with her my concerns in regards to her fracture.  Her elbow upper arm and proximal forearm are nontender. Assessment/Plan Comminuted complex greater than  5 part articular fracture-we will plan for repair reconstruction is necessary. We are planning surgery for your upper extremity. The risk and benefits of surgery to include risk of bleeding, infection, anesthesia,  damage to normal structures and failure of the surgery to accomplish its intended goals of relieving symptoms and restoring function have been discussed in detail. With this in mind we plan to proceed. I have specifically discussed with the patient the pre-and postoperative regime and the dos and don'ts and risk and benefits in great detail. Risk and benefits of surgery also include risk of dystrophy(CRPS), chronic nerve pain, failure of the healing process to go onto completion and other inherent risks of surgery The relavent the pathophysiology of the disease/injury process, as well as the alternatives for treatment and postoperative course of action has been discussed in great detail with the patient who desires to proceed.  We will do everything in our power to help you (the patient) restore function to the upper extremity. It is a pleasure to see this patient today.  Willa Frater III, MD 12/05/2017, 7:44 PM

## 2017-12-05 NOTE — Anesthesia Procedure Notes (Signed)
Anesthesia Regional Block: Supraclavicular block   Pre-Anesthetic Checklist: ,, timeout performed, Correct Patient, Correct Site, Correct Laterality, Correct Procedure, Correct Position, site marked, Risks and benefits discussed,  Surgical consent,  Pre-op evaluation,  At surgeon's request and post-op pain management  Laterality: Upper and Left  Prep: chloraprep       Needles:  Injection technique: Single-shot  Needle Type: Echogenic Needle          Additional Needles:   Procedures:,,,, ultrasound used (permanent image in chart),,,,  Narrative:  Start time: 12/05/2017 7:58 PM End time: 12/05/2017 8:01 PM Injection made incrementally with aspirations every 5 mL.  Performed by: Personally   Additional Notes: H+P and labs reviewed, risks and benefits discussed with patient, procedure tolerated well without complications

## 2017-12-06 ENCOUNTER — Other Ambulatory Visit: Payer: Self-pay

## 2017-12-06 MED ORDER — NORTRIPTYLINE HCL 25 MG PO CAPS
100.0000 mg | ORAL_CAPSULE | Freq: Every day | ORAL | Status: DC
  Administered 2017-12-06: 100 mg via ORAL
  Filled 2017-12-06: qty 4

## 2017-12-06 MED ORDER — ELETRIPTAN HYDROBROMIDE 40 MG PO TABS
40.0000 mg | ORAL_TABLET | ORAL | Status: DC | PRN
  Administered 2017-12-07: 40 mg via ORAL
  Filled 2017-12-06 (×2): qty 1

## 2017-12-06 NOTE — Progress Notes (Signed)
Patient has been seen and examined. Patient has pain appropriate to his injury/process. Patient denies new complaints at this present time. I have discussed the care pathway with nursing staff. Patient is appropriate and alert.  We reviewed vital signs and intake output which are stable.  The upper extremity is examined. Refill is normal. There is no signs of compartment syndrome. There is no signs of dystrophy.  The block is still in effect   I have spent a  great deal of time discussing range of motion edema control and other techniques to decrease edema and promote flexion extension of the fingers. Patient understands the importance of elevation range of motion massage and other measures to lessen pain and prevent swelling.  We have also discussed immobilization to appropriate areas involved.  We have discussed with the patient shoulder range of motion to prevent adhesive capsulitis.  The remainder of the examination is normal today without complicating feature.  Drain was removed without difficulty   All questions have been answered.  I went over the operation and the postop algorithm in terms of care plans.  I would recommend out of work at this time and would recommend delaying her upcoming hysterectomy.  I tried to have a very detailed discussion of what she should expect and the challenges ahead of her.  Patient understands this.  Will likely DC tomorrow.  The patient is alert and oriented in no acute distress. The patient complains of pain in the affected upper extremity.  The patient is noted to have a normal HEENT exam. Lung fields show equal chest expansion and no shortness of breath. Abdomen exam is nontender without distention. Lower extremity examination does not show any fracture dislocation or blood clot symptoms. Pelvis is stable and the neck and back are stable and nontender.  Aviyon Hocevar MDPatient ID: Malva Cogan, female   DOB: 05/02/1966, 52 y.o.   MRN:  [id number]

## 2017-12-06 NOTE — Evaluation (Signed)
Occupational Therapy Evaluation and Discharge Patient Details Name: [name] MRN: [id number] DOB: 1966-04-30 Today's Date: 12/06/2017    History of Present Illness Pt is a 52 y.o. female who sustained a comminuted complex severe L distal radius fracture after falling off of a stool. Pt now s/p ORIF distal radius fractures, dorsal EPL decompression and tendon transfer, allograft bone grafting L distal radius, 4 view x-ray series L radius, L open carpal tunnel release. PMH significant for history of ectopiv pregnancy, hypothyroidism,menorrhagia, and migraine.   Clinical Impression   PTA, pt was independent with ADL and functional mobility. She currently is limited with ADL participation due to decreased functional use of L UE. Pt educated concerning edema management strategies (use of ice and elevation), elbow AROM, shoulder AROM, and compensatory ADL strategies to maximize independence. After education, pt was able to complete UB ADL at a supervision level. She demonstrates modified independence in LB ADL and toileting tasks. All education complete and pt reports no further questions or concerns. Acute OT will sign off.      Follow Up Recommendations  Follow surgeon's recommendation for DC plan and follow-up therapies;Supervision - Intermittent    Equipment Recommendations  None recommended by OT    Recommendations for Other Services       Precautions / Restrictions Precautions Required Braces or Orthoses: Sling(L sling) Restrictions Weight Bearing Restrictions: Yes LUE Weight Bearing: Non weight bearing      Mobility Bed Mobility               General bed mobility comments: OOB in chair on my arrival.   Transfers Overall transfer level: Modified independent               General transfer comment: Increased time; no physical assist    Balance Overall balance assessment: No apparent balance deficits (not formally assessed)                                          ADL either performed or assessed with clinical judgement   ADL Overall ADL's : Needs assistance/impaired Eating/Feeding: Set up;Sitting   Grooming: Set up;Standing   Upper Body Bathing: Supervision/ safety;Sitting   Lower Body Bathing: Supervison/ safety;Sit to/from stand   Upper Body Dressing : Sitting;Supervision/safety Upper Body Dressing Details (indicate cue type and reason): including sling Lower Body Dressing: Supervision/safety;Sit to/from stand   Toilet Transfer: Modified Independent   Toileting- Clothing Manipulation and Hygiene: Modified independent       Functional mobility during ADLs: Modified independent General ADL Comments: Pt educated concerning compensatory UB dressing strategies, sling wear schedule, and edema management strategies.      Vision Patient Visual Report: No change from baseline Vision Assessment?: No apparent visual deficits     Perception     Praxis      Pertinent Vitals/Pain Pain Assessment: Faces Faces Pain Scale: Hurts little more Pain Location: L wrist Pain Descriptors / Indicators: Aching;Grimacing;Operative site guarding Pain Intervention(s): Limited activity within patient's tolerance;Monitored during session;Repositioned     Hand Dominance Right   Extremity/Trunk Assessment Upper Extremity Assessment Upper Extremity Assessment: LUE deficits/detail LUE Deficits / Details: Sensation returning. Able to move fingers. Minimal control at elbow and shoulder but able to complete PROM/AAROM [facility]. Wrist immobilized.    Lower Extremity Assessment Lower Extremity Assessment: Overall WFL for tasks assessed       Communication Communication Communication: No difficulties  Cognition Arousal/Alertness: Awake/alert Behavior During Therapy: WFL for tasks assessed/performed Overall Cognitive Status: Within Functional Limits for tasks assessed                                     General Comments        Exercises     Shoulder Instructions      Home Living Family/patient expects to be discharged to:: Private residence Living Arrangements: Alone Available Help at Discharge: Friend(s);Available PRN/intermittently Type of Home: House       Home Layout: Laundry or work area in basement     Bathroom Shower/Tub: Teacher, early years/pre: Standard     Home Equipment: None          Prior Functioning/Environment Level of Independence: Independent                 OT Problem List: Decreased activity tolerance;Impaired UE functional use;Decreased knowledge of use of DME or AE      OT Treatment/Interventions: Self-care/ADL training;Therapeutic exercise;Energy conservation;DME and/or AE instruction;Therapeutic activities;Patient/family education;Balance training    OT Goals(Current goals can be found in the care plan section) Acute Rehab OT Goals Patient Stated Goal: go home OT Goal Formulation: With patient Time For Goal Achievement: 12/20/17 Potential to Achieve Goals: Good  OT Frequency: Min 2X/week   Barriers to D/C:            Co-evaluation              AM-PAC PT "6 Clicks" Daily Activity     Outcome Measure Help from another person eating meals?: A Little Help from another person taking care of personal grooming?: A Little Help from another person toileting, which includes using toliet, bedpan, or urinal?: None Help from another person bathing (including washing, rinsing, drying)?: None Help from another person to put on and taking off regular upper body clothing?: None Help from another person to put on and taking off regular lower body clothing?: None 6 Click Score: 22   End of Session Equipment Utilized During Treatment: (L UE sling)  Activity Tolerance: Patient tolerated treatment well Patient left: in chair;with call bell/phone within reach  OT Visit Diagnosis: Pain Pain - Right/Left: Left Pain - part of body: Arm                 Time: 9233-0076 OT Time Calculation (min): 21 min Charges:  OT General Charges $OT Visit: 1 Visit OT Evaluation $OT Eval Moderate Complexity: 1 Mod G-Codes:     Norman Herrlich, MS OTR/L  Pager: [place] A Tamon Parkerson 12/06/2017, 1:57 PM

## 2017-12-06 NOTE — Progress Notes (Signed)
PT Discharge Note  Patient Details Name: [name] MRN: [id number] DOB: 05/24/1966   Cancelled Treatment:    Reason Eval/Treat Not Completed: PT screened, no needs identified, will sign off -- observed walking in hall with OT, pt denies worries concerns problems with mobility, all other needs can be met by OT.   Martin, Jennifer Galloway 12/06/2017, 12:09 PM   

## 2017-12-06 NOTE — Progress Notes (Signed)
Received pt alert and oriented. Ambulated to the bathroom with 1 assist. Dressing and compression wrap to left lower arm with red top drain. Maintained ice pack to wound. Kept left arm elevated. Pt stated she feels mild tingling to left hand fingers but still numb and unable to move fingers at this time. Will monitor pt.

## 2017-12-06 NOTE — Progress Notes (Signed)
Orthopedic Tech Progress Note Patient Details:  [name] 10-Feb-1966 [id number]  Ortho Devices Type of Ortho Device: Arm sling Ortho Device/Splint Location: lue Ortho Device/Splint Interventions: Application   Post Interventions Patient Tolerated: Well Instructions Provided: Care of device   Hildred Priest 12/06/2017, 11:51 AM

## 2017-12-06 NOTE — Anesthesia Postprocedure Evaluation (Signed)
Anesthesia Post Note  Patient: [name]  Procedure(s) Performed: OPEN REDUCTION INTERNAL FIXATION (ORIF) WRIST FRACTURE WITH CARPAL TUNNEL RELEASE (Left )     Patient location during evaluation: PACU Anesthesia Type: Regional and General Level of consciousness: awake and alert Pain management: pain level controlled Vital Signs Assessment: post-procedure vital signs reviewed and stable Respiratory status: spontaneous breathing, nonlabored ventilation, respiratory function stable and patient connected to nasal cannula oxygen Cardiovascular status: blood pressure returned to baseline and stable Postop Assessment: no apparent nausea or vomiting Anesthetic complications: no    Last Vitals:  Vitals:   12/05/17 2306 12/05/17 2328  BP: (!) 144/90 (!) 148/89  Pulse: (!) 111 (!) 105  Resp: 18 19  Temp:  37 C  SpO2: 97% 98%    Last Pain:  Vitals:   12/06/17 0048  TempSrc:   PainSc: Asleep                 Mayu Ronk

## 2017-12-07 ENCOUNTER — Encounter (HOSPITAL_COMMUNITY): Payer: Self-pay | Admitting: Orthopedic Surgery

## 2017-12-07 MED FILL — ONDANSETRON HCL 8 MG TABLET: 8 | 8 days supply | Qty: 25 | Fill #0

## 2017-12-07 MED FILL — METHOCARBAMOL 500 MG TABS: 500 | 5 days supply | Qty: 45 | Fill #0

## 2017-12-07 MED FILL — oxyCODONE HCL 5 MG TABS: 5 | 10 days supply | Qty: 42 | Fill #0

## 2017-12-07 NOTE — Progress Notes (Signed)
Patient ID: [name], female   DOB: March 12, 1966, 52 y.o.   MRN: [id number] Patient has been seen and examined. Patient has pain appropriate to his injury/process. Patient denies new complaints at this present time. I have discussed the care pathway with nursing staff. Patient is appropriate and alert.  We reviewed vital signs and intake output which are stable.  The upper extremity is neurovascularly intact. Refill is normal. There is no signs of compartment syndrome. There is no signs of dystrophy. There is normal sensation.  I have spent a  great deal of time discussing range of motion edema control and other techniques to decrease edema and promote flexion extension of the fingers. Patient understands the importance of elevation range of motion massage and other measures to lessen pain and prevent swelling.  We have also discussed immobilization to appropriate areas involved.  We have discussed with the patient shoulder range of motion to prevent adhesive capsulitis.  The remainder of the examination is normal today without complicating feature.    Patient will be discharged home. Will plan to see the patient back in the office as per discharge instructions (please see discharge instructions).  Patient had an uneventful hospital course. At the time of discharge patient is stable awake alert and oriented in no acute distress. Regular diet will be continued and has been tolerated. Patient will notify should have problems occur. There is no signs of DVT infection or other complication at this juncture.  All questions have been incurred and answered.  Please see discharge med list  Muslima Toppins MD

## 2017-12-07 NOTE — Progress Notes (Signed)
Patient discharged to home. Verbalizes understanding of all discharge instructions including incision care, discharge medications, and follow up MD visits. Patient accompanied by friend.

## 2017-12-07 NOTE — Discharge Summary (Signed)
Physician Discharge Summary  Patient ID: [name] MRN: [id number] DOB/AGE: 03/21/1966 52 y.o.  Admit date: 12/05/2017 Discharge date:   Admission Diagnoses: left wrist fracture Past Medical History:  Diagnosis Date  . History of ectopic pregnancy    04/ 2007  S/P LEFT SALPINGECTOMY  . History of kidney stones   . Hypothyroidism   . Menorrhagia   . Migraine     Discharge Diagnoses:  Active Problems:   Other intraarticular fracture of lower end of left radius, initial encounter for closed fracture   Surgeries: Procedure(s): OPEN REDUCTION INTERNAL FIXATION (ORIF) WRIST FRACTURE WITH CARPAL TUNNEL RELEASE on 12/05/2017    Consultants:   Discharged Condition: Improved  Hospital Course: [name] is an 52 y.o. female who was admitted 12/05/2017 with a chief complaint of  Chief Complaint  Patient presents with  . Hand Pain  , and found to have a diagnosis of left wrist fracture.  They were brought to the operating room on 12/05/2017 and underwent Procedure(s): OPEN REDUCTION INTERNAL FIXATION (ORIF) WRIST FRACTURE WITH CARPAL TUNNEL RELEASE.    They were given perioperative antibiotics:  Anti-infectives (From admission, onward)   Start     Dose/Rate Route Frequency Ordered Stop   12/06/17 0800  ceFAZolin (ANCEF) IVPB 1 g/50 mL premix     1 g 100 mL/hr over 30 Minutes Intravenous Every 8 hours 12/05/17 2326     12/06/17 0200  ceFAZolin (ANCEF) IVPB 1 g/50 mL premix     1 g 100 mL/hr over 30 Minutes Intravenous NOW 12/05/17 2326 12/06/17 0331    .  They were given sequential compression devices, early ambulation, and Other (comment) for DVT prophylaxis.  Recent vital signs:  Patient Vitals for the past 24 hrs:  BP Temp Temp src Pulse Resp SpO2  12/07/17 0612 134/81 98 F (36.7 C) Oral 85 20 99 %  12/06/17 2100 (!) 145/93 97.7 F (36.5 C) Oral 95 20 99 %  12/06/17 1338 (!) 148/80 98.9 F (37.2 C) Oral (!) 109 15 96 %  .  Recent laboratory studies: Dg Wrist  Complete Left  Result Date: 12/05/2017 CLINICAL DATA:  Pt fell earlier today and impacted ulnar aspect of wrist on a rock. Very limited RoM, obvious deformity. No previous injures to area, Nondiabetic. EXAM: LEFT WRIST - COMPLETE 3+ VIEW COMPARISON:  None. FINDINGS: There is a comminuted, impacted and dorsally angulated fracture of the distal radius. There is a transverse fracture component across the metaphysis. Additional fractures extend to the radiocarpal articular surface. The articular surface of the distal radius is also angled posteriorly by 60 degrees. Comminuted fracture components along the dorsal aspect of the distal radius mildly displaced dorsally. There is additional oblique fracture extending from the metaphysis to the distal diaphysis, with a single comminuted fracture component. On the lateral view, the distal portion of this fracture is displaced in a volar direction by 7 mm. There is no dislocation. The impacted ulnar fracture leads to a significant ulnar positive variance. Surrounding soft tissue swelling. IMPRESSION: 1. Comminuted, impacted and displaced fractures of the distal radius as described as well as dorsal angulation of the distal radial articular surface of 60 degrees. No dislocation. Electronically Signed   By: Lajean Manes M.D.   On: 12/05/2017 17:51    Discharge Medications:   Allergies as of 12/07/2017   No Known Allergies     Medication List    TAKE these medications   ADVIL MIGRAINE 200 MG Caps Generic drug:  Ibuprofen Take by mouth daily as needed.   amoxicillin-clavulanate 875-125 MG tablet Commonly known as:  AUGMENTIN Take 1 tablet 2 (two) times daily by mouth.   amoxicillin-clavulanate 875-125 MG tablet Commonly known as:  AUGMENTIN Take 1 tablet by mouth 2 (two) times daily.   cholecalciferol 1000 units tablet Commonly known as:  VITAMIN D Take 1,000 Units by mouth daily.   co-enzyme Q-10 30 MG capsule Take 100 mg by mouth 3 (three) times  daily.   Diclofenac Sodium 2 % Soln Commonly known as:  PENNSAID Place 2 application onto the skin 2 (two) times daily.   eletriptan 40 MG tablet Commonly known as:  RELPAX TAKE 1 TABLET BY MOUTH AT EARLIEST ONSET OF MIGRAINE. MAY REPEAT ONCE IN 2 HOURS IF HEADACHE PERSISTS OR RECURS.   levothyroxine 88 MCG tablet Commonly known as:  SYNTHROID, LEVOTHROID Take 88 mcg by mouth daily before breakfast.   magnesium (amino acid chelate) 133 MG tablet Take 1 tablet by mouth 2 (two) times daily.   magnesium gluconate 500 MG tablet Commonly known as:  MAGONATE Take 500 mg by mouth 2 (two) times daily.   multivitamin with minerals Tabs tablet Take 1 tablet by mouth daily.   nortriptyline 50 MG capsule Commonly known as:  PAMELOR Take 1 capsule (50 mg total) by mouth at bedtime.   nortriptyline 50 MG capsule Commonly known as:  PAMELOR Take 2 capsules (100 mg total) by mouth at bedtime.   SUMAtriptan 100 MG tablet Commonly known as:  IMITREX Take 1 tablet (100 mg total) by mouth once as needed for migraine. May repeat in 2 hours if headache persists or recurs.   tiZANidine 4 MG tablet Commonly known as:  ZANAFLEX TAKE 1/2 TABLET BY MOUTH AT BEDTIME AS NEEDED FOR MUSCLE SPASMS.   Turmeric 500 MG Caps Take 500 mg by mouth 2 (two) times daily.   vitamin B-12 500 MCG tablet Commonly known as:  CYANOCOBALAMIN Take 400 mcg by mouth daily.   VITAMIN B-2 PO Take 1 capsule by mouth.   Vitamin D (Ergocalciferol) 50000 units Caps capsule Commonly known as:  DRISDOL Take 1 capsule (50,000 Units total) by mouth every 7 (seven) days.       Diagnostic Studies: Dg Wrist Complete Left  Result Date: 12/05/2017 CLINICAL DATA:  Pt fell earlier today and impacted ulnar aspect of wrist on a rock. Very limited RoM, obvious deformity. No previous injures to area, Nondiabetic. EXAM: LEFT WRIST - COMPLETE 3+ VIEW COMPARISON:  None. FINDINGS: There is a comminuted, impacted and dorsally  angulated fracture of the distal radius. There is a transverse fracture component across the metaphysis. Additional fractures extend to the radiocarpal articular surface. The articular surface of the distal radius is also angled posteriorly by 60 degrees. Comminuted fracture components along the dorsal aspect of the distal radius mildly displaced dorsally. There is additional oblique fracture extending from the metaphysis to the distal diaphysis, with a single comminuted fracture component. On the lateral view, the distal portion of this fracture is displaced in a volar direction by 7 mm. There is no dislocation. The impacted ulnar fracture leads to a significant ulnar positive variance. Surrounding soft tissue swelling. IMPRESSION: 1. Comminuted, impacted and displaced fractures of the distal radius as described as well as dorsal angulation of the distal radial articular surface of 60 degrees. No dislocation. Electronically Signed   By: Lajean Manes M.D.   On: 12/05/2017 17:51    They benefited maximally from their hospital stay and  there were no complications.     Disposition: Discharge disposition: 01-Home or Self Care      Discharge Instructions    Call MD / Call 911   Complete by:  As directed    If you experience chest pain or shortness of breath, CALL 911 and be transported to the hospital emergency room.  If you develope a fever above 101 F, pus (white drainage) or increased drainage or redness at the wound, or calf pain, call your surgeon's office.   Constipation Prevention   Complete by:  As directed    Drink plenty of fluids.  Prune juice may be helpful.  You may use a stool softener, such as Colace (over the counter) 100 mg twice a day.  Use MiraLax (over the counter) for constipation as needed.   Diet - low sodium heart healthy   Complete by:  As directed    Increase activity slowly as tolerated   Complete by:  As directed      Follow-up Information    Roseanne Kaufman, MD  Follow up in 14 day(s).   Specialty:  Orthopedic Surgery Why:  we will call for your follow up Contact information: [address] [place] [address] [phone]            Patient is doing quite well status post ORIF left distal radius fracture.  She will be discharged home on OxyIR 1-2 every 4-6 hours as needed pain.  Dispense #42 she will also be discharged home on Robaxin dispense 40- 1 tablet every 6-8 hours as needed for spasm and Zofran as needed nausea.  We will see her back in the office in 12 days for follow-up  Patient did quite well with her hospital stay.  She is neurovascularly intact there is no complicating features.  There is pleasure to see her down to spent in her care should any problems arise she will notify us.  At the conclusion of her hospital stay she was awake alert and oriented.  She was tolerating regular diet.  No signs of DVT infection or other issue.  Signed: Satira Anis Aarush Stukey III 12/07/2017, 7:05 AM

## 2017-12-07 NOTE — Op Note (Signed)
NAME:  [name], [name]                     ACCOUNT NO.:  MEDICAL RECORD NO.:  [id number]  LOCATION:                                 FACILITY:  PHYSICIAN:  Satira Anis. Antwaine Boomhower, M.D.DATE OF BIRTH:  [birth year]  DATE OF PROCEDURE: DATE OF DISCHARGE:                              OPERATIVE REPORT   PREOPERATIVE DIAGNOSIS:  Comminuted complex intra-articular greater than 5-part fracture of distal radius of left upper extremity with extension into the shaft region.  POSTOPERATIVE DIAGNOSIS:  Comminuted complex intra-articular greater than 5-part fracture of distal radius of left upper extremity with extension into the shaft region.  PROCEDURES: 1. Open reduction and internal fixation of greater than 5-part intra-     articular distal radius fracture with a long DVR Crosslock plate     from [employer] and associated bone grafting with allograft bone graft. 2. Dorsal EPL decompression and tendon transfer of the extensor     pollicis longus. 3. Allograft bone grafting, left distal radius. 4. Left open carpal tunnel release. 5. Four view x-ray series, left radius.  SURGEON:  Satira Anis. Amedeo Plenty, M.D.  ASSISTANT:  None.  COMPLICATIONS:  None.  ANESTHESIA:  General with preoperative block.  TOURNIQUET TIME:  Less than 2 hours.  DRAINS:  One.  INDICATIONS:  The patient is a 52 year old female who presents with the above-mentioned diagnosis.  I have counseled the patient in regard to risks and benefits of surgery including risk of infection, bleeding, anesthesia, damage to normal structures, and failure of surgery to accomplish its intended goals of relieving symptoms and restoring function.  With this in mind, she desires to proceed.  This was a fall today off a stool, the stool broke, she fell on her arm and had a severely displaced fracture.  She understands risks and benefit profile, do's and don'ts, and other aspects of our care plan.  OPERATIVE PROCEDURE:  The patient was seen by  myself and Dr. Ermalene Postin of Anesthesia, who performed a preoperative block.  She was prepped and draped in the operative theater after general anesthesia was induced with Hibiclens scrub x2, followed by 10 minutes surgical Betadine scrub and paint.  Once this was completed, sterile field was secured, time-out observed, and antibiotics were given in the form of Ancef.  A curvilinear volar radial incision was made under 250 mm of tourniquet control and fingertrap traction.  Dissection was carried down, FCR was released dorsally and palmarly.  Carpal canal contents retracted ulnarly and the fracture was then accessed with incision of the pronator.  She had shaft extension and thus this was a longer incision than usual.  I accessed the fracture and then set forth piecemeal in to get to the shaft fracture fragments.  This was done and three different Kirschner wires were used to hold preliminary fixation.  I then produced a reduction distally about the distal radius and following this, preliminarily applied a plate, checked this under x-ray and then purchased the plate with screws.  I was able to achieve adequate radial height inclination and volar tilt to my satisfaction.  I was pleased with this and noted the patient had good stability with excellent  range of motion and good stability of the distal radioulnar joint as well as midcarpal and radiocarpal joints.  I irrigated copiously and then closed the pronator with Vicryl.  Following this, attention was turned towards the dorsum of the hand where I made an incision over Lister's tubercle.  Dissection was carried down.  The EPL was decompressed in its entirety and transferred dorsally.  This was a tendon transfer of the EPL tendon.  I then packed a large amount of allograft bone graft in the dorsal V defect.  The patient tolerated this well.  There were no complicating features.  Once the dorsal V defect was packed, I then ensured that the  x-rays looked excellent.  The x-ray did look excellent.  There was some bony fragment distally overlying the radiocarpal region, but this did not impede motion and was not in 4th dorsal compartment or in the EPL region.  Thus, I did not go after this, but we will keep a key now on this postoperatively as it could be removed at a later date if necessary.  Once this was complete, I irrigated and ultimately closed this wound.  Following this, an incision was made and a left open carpal tunnel release was accomplished under standard 4.0 loupe magnification.  Fat pad egressed distally.  Distal proximal dissection was carried out and complete release off the ulnar ledge was accomplished without difficulty.  The median nerve was hyperemic intact and there was blood in the canal indicative of a compression phenomenon from the fracture. Once this was complete, we irrigated copiously and then placed a TLS drain in the main wound, followed by closure of the skin with 3-0 and 4- 0 Prolene.  The carpal tunnel release site was closed after hemostasis was secured with 4-0 Prolene as well.  A standard dressing of dorsal and volar slabs were applied after sterilely Mepitel, Xeroform gauze, Kerlix, and Webril were placed.  The patient tolerated this well.  She will be monitored in recovery room and DC tomorrow if appropriate. There were no complicating features with surgical endeavors.  This was a very severe fracture and we need to watch her closely in the postop.  I will go ahead and cast her for the first 4 weeks and then predicate it on x-rays, begin some very gentle motion at weeks 5 to 6. We will remove the cast and perform a removable splint at 4 weeks, but I want to delay motion until she is 5 to 6 weeks out.  Strengthening will be delayed until 10 weeks and we will ask for an Exogen bone stimulator to be placed as soon as possible.  Given the large bony void and the comminution, I feel a bone  stimulator would be highly beneficial.  These notes have been discussed.  All questions have been encouraged and answered.     Satira Anis. Amedeo Plenty, M.D.     [facility]  D:  12/05/2017  T:  12/05/2017  Job:  [id number]

## 2017-12-07 NOTE — Discharge Instructions (Signed)

## 2017-12-10 MED FILL — NORTRIPTYLINE HCL 50 MG CAP: 50 | 30 days supply | Qty: 60 | Fill #9

## 2017-12-11 ENCOUNTER — Encounter (HOSPITAL_COMMUNITY): Admission: RE | Admit: 2017-12-11 | Payer: 59 | Source: Ambulatory Visit

## 2017-12-18 DIAGNOSIS — M25532 Pain in left wrist: Secondary | ICD-10-CM | POA: Diagnosis not present

## 2017-12-18 DIAGNOSIS — S52532D Colles' fracture of left radius, subsequent encounter for closed fracture with routine healing: Secondary | ICD-10-CM | POA: Diagnosis not present

## 2017-12-18 DIAGNOSIS — Z4789 Encounter for other orthopedic aftercare: Secondary | ICD-10-CM | POA: Diagnosis not present

## 2017-12-18 DIAGNOSIS — S52502A Unspecified fracture of the lower end of left radius, initial encounter for closed fracture: Secondary | ICD-10-CM | POA: Diagnosis not present

## 2017-12-18 MED FILL — oxyCODONE HCL 5 MG TABS: 5 | 10 days supply | Qty: 40 | Fill #0

## 2017-12-18 MED FILL — METHOCARBAMOL 500 MG TABLET: 500 | 10 days supply | Qty: 30 | Fill #0

## 2017-12-21 ENCOUNTER — Ambulatory Visit (HOSPITAL_COMMUNITY): Admission: RE | Admit: 2017-12-21 | Payer: 59 | Source: Ambulatory Visit | Admitting: Obstetrics and Gynecology

## 2017-12-21 ENCOUNTER — Encounter (HOSPITAL_COMMUNITY): Admission: RE | Payer: Self-pay | Source: Ambulatory Visit

## 2017-12-21 SURGERY — HYSTERECTOMY, VAGINAL, LAPAROSCOPY-ASSISTED, WITH SALPINGECTOMY
Anesthesia: Choice | Laterality: Bilateral

## 2017-12-28 MED FILL — ELETRIPTAN HBR 40 MG TABLET: 40 | 30 days supply | Qty: 24 | Fill #3

## 2018-01-01 DIAGNOSIS — Z4789 Encounter for other orthopedic aftercare: Secondary | ICD-10-CM | POA: Diagnosis not present

## 2018-01-01 DIAGNOSIS — M25632 Stiffness of left wrist, not elsewhere classified: Secondary | ICD-10-CM | POA: Diagnosis not present

## 2018-01-01 DIAGNOSIS — M25532 Pain in left wrist: Secondary | ICD-10-CM | POA: Diagnosis not present

## 2018-01-01 DIAGNOSIS — S52532D Colles' fracture of left radius, subsequent encounter for closed fracture with routine healing: Secondary | ICD-10-CM | POA: Diagnosis not present

## 2018-01-05 ENCOUNTER — Telehealth: Payer: 59 | Admitting: Family

## 2018-01-05 DIAGNOSIS — N390 Urinary tract infection, site not specified: Secondary | ICD-10-CM

## 2018-01-05 MED ORDER — CEPHALEXIN 500 MG PO CAPS: 500.0000 mg | ORAL_CAPSULE | Freq: Two times a day (BID) | ORAL | 0 refills | Status: DC

## 2018-01-05 MED FILL — CEPHALEXIN 500 MG CAPSULE: 500 | 7 days supply | Qty: 14 | Fill #0

## 2018-01-05 NOTE — Progress Notes (Signed)

## 2018-01-06 ENCOUNTER — Telehealth: Payer: 59 | Admitting: Nurse Practitioner

## 2018-01-06 DIAGNOSIS — N3000 Acute cystitis without hematuria: Secondary | ICD-10-CM

## 2018-01-06 MED ORDER — FLUCONAZOLE 150 MG PO TABS: 150.0000 mg | ORAL_TABLET | Freq: Once | ORAL | 0 refills | Status: AC

## 2018-01-06 MED ORDER — CIPROFLOXACIN HCL 500 MG PO TABS: 500.0000 mg | ORAL_TABLET | Freq: Two times a day (BID) | ORAL | 0 refills | Status: DC

## 2018-01-06 MED FILL — CIPROFLOXACIN HCL 500 MG TA: 500 | 5 days supply | Qty: 10 | Fill #0

## 2018-01-06 MED FILL — FLUCONAZOLE 150 MG TABS: 150 | 1 days supply | Qty: 1 | Fill #0

## 2018-01-06 NOTE — Progress Notes (Signed)
We are sorry that you are not feeling well.  Here is how we plan to help!  Based on what you shared with me it looks like you most likely have a simple urinary tract infection.  A UTI (Urinary Tract Infection) is a bacterial infection of the bladder.  Most cases of urinary tract infections are simple to treat but a key part of your care is to encourage you to drink plenty of fluids and watch your symptoms carefully.  I have prescribed Ciprofloxacin 500 mg twice a day for 5 days.  Your symptoms should gradually improve. Call us if the burning in your urine worsens, you develop worsening fever, back pain or pelvic pain or if your symptoms do not resolve after completing the antibiotic.i also sne tin diflucan as requested.  Urinary tract infections can be prevented by drinking plenty of water to keep your body hydrated.  Also be sure when you wipe, wipe from front to back and don't hold it in!  If possible, empty your bladder every 4 hours.  Your e-visit answers were reviewed by a board certified advanced clinical practitioner to complete your personal care plan.  Depending on the condition, your plan could have included both over the counter or prescription medications.  If there is a problem please reply  once you have received a response from your provider.  Your safety is important to [place].  If you have drug allergies check your prescription carefully.    You can use MyChart to ask questions about today's visit, request a non-urgent call back, or ask for a work or school excuse for 24 hours related to this e-Visit. If it has been greater than 24 hours you will need to follow up with your provider, or enter a new e-Visit to address those concerns.   You will get an e-mail in the next two days asking about your experience.  I hope that your e-visit has been valuable and will speed your recovery. Thank you for using e-visits.

## 2018-01-08 DIAGNOSIS — M25632 Stiffness of left wrist, not elsewhere classified: Secondary | ICD-10-CM | POA: Diagnosis not present

## 2018-01-12 MED FILL — NORTRIPTYLINE HCL 50 MG CAP: 50 | 30 days supply | Qty: 60 | Fill #10

## 2018-01-14 ENCOUNTER — Telehealth: Payer: Self-pay | Admitting: Neurology

## 2018-01-14 NOTE — Telephone Encounter (Signed)
Patient called to make a new Botox appt. She is scheduled for 04/09/18. She is hoping to be seen sooner due to her headaches getting worse. She fell and broke her arm and was not able to have her Hysterectomy that she thought might would help the migraines. She also needing her Nortriptyline and her Relpax Refilled. Please Call. Thanks

## 2018-01-14 NOTE — Telephone Encounter (Signed)
Called and spoke with Pt, advised her since is her first Botox injection, she will need to come on a Botox clinic day so that future injections will be 90 days. Pt to have pharmacy contact us for refills

## 2018-01-20 DIAGNOSIS — S52532D Colles' fracture of left radius, subsequent encounter for closed fracture with routine healing: Secondary | ICD-10-CM | POA: Diagnosis not present

## 2018-01-20 DIAGNOSIS — M25632 Stiffness of left wrist, not elsewhere classified: Secondary | ICD-10-CM | POA: Diagnosis not present

## 2018-01-20 DIAGNOSIS — M25532 Pain in left wrist: Secondary | ICD-10-CM | POA: Diagnosis not present

## 2018-01-20 DIAGNOSIS — Z4789 Encounter for other orthopedic aftercare: Secondary | ICD-10-CM | POA: Diagnosis not present

## 2018-01-26 NOTE — Progress Notes (Signed)
[name] [address] [place], [place] [address] Phone: [phone] Subjective:    I'm seeing this patient by the request  of:    CC: Headache follow-up  [id number]  [name] is a 52 y.o. female coming in with complaint of headache.  Patient needs to be seen for more cervicogenic headaches.  Started having worsening pain again.  Patient was to have a hysterectomy but this got canceled secondary to a distal radius fracture that needed surgical intervention.  Headaches have seem to be worsening.  When asked if any medication changes patient stopped once weekly vitamin D.    Past Medical History:  Diagnosis Date  . History of ectopic pregnancy    04/ 2007  S/P LEFT SALPINGECTOMY  . History of kidney stones   . Hypothyroidism   . Menorrhagia   . Migraine    Past Surgical History:  Procedure Laterality Date  . CARPAL TUNNEL RELEASE Left 12/05/2017   Procedure: OPEN REDUCTION INTERNAL FIXATION (ORIF) WRIST FRACTURE WITH CARPAL TUNNEL RELEASE;  Surgeon: Roseanne Kaufman, MD;  Location: [facility];  Service: Orthopedics;  Laterality: Left;  . DILATATION & CURETTAGE/HYSTEROSCOPY WITH MYOSURE  2016  . DILATATION & CURETTAGE/HYSTEROSCOPY WITH MYOSURE N/A 10/23/2016   Procedure: DILATATION & CURETTAGE/HYSTEROSCOPY;  Surgeon: Everlene Farrier, MD;  Location: [facility];  Service: Gynecology;  Laterality: N/A;  . DILATION AND EVACUATION  10/07/2007   retained poc  . LAPAROSCOPIC UNILATERAL SALPINGECTOMY Left 11/24/2005   Social History   Socioeconomic History  . Marital status: Widowed    Spouse name: Not on file  . Number of children: 0  . Years of education: 76  . Highest education level: Not on file  Occupational History  . Occupation: [employer]  Social Needs  . Financial resource strain: Not on file  . Food insecurity:    Worry: Not on file    Inability: Not on file  . Transportation needs:    Medical: Not on  file    Non-medical: Not on file  Tobacco Use  . Smoking status: Never Smoker  . Smokeless tobacco: Never Used  Substance and Sexual Activity  . Alcohol use: No  . Drug use: No  . Sexual activity: Not on file  Lifestyle  . Physical activity:    Days per week: Not on file    Minutes per session: Not on file  . Stress: Not on file  Relationships  . Social connections:    Talks on phone: Not on file    Gets together: Not on file    Attends religious service: Not on file    Active member of club or organization: Not on file    Attends meetings of clubs or organizations: Not on file    Relationship status: Not on file  Other Topics Concern  . Not on file  Social History Narrative   Lives at home alone   Right-handed   Caffeine: weekly   No Known Allergies Family History  Problem Relation Age of Onset  . Hypertension Mother   . Aortic aneurysm Mother   . Liver cancer Father   . Prostate cancer Brother   . Migraines Sister      Past medical history, social, surgical and family history all reviewed in electronic medical record.  No pertanent information unless stated regarding to the chief complaint.   Review of Systems:Review of systems updated and as accurate as of 01/27/18  No visual changes, nausea,  vomiting, diarrhea, constipation, dizziness, abdominal pain, skin rash, fevers, chills, night sweats, weight loss, swollen lymph nodes, body aches, joint swelling, muscle aches, chest pain, shortness of breath, mood changes.  Positive headache  Objective  Blood pressure 120/90, pulse 92, height 5\' 7"  (1.702 m), weight 186 lb (84.4 kg), SpO2 98 %. Systems examined below as of 01/27/18   General: No apparent distress alert and oriented x3 mood and affect normal, dressed appropriately.  HEENT: Pupils equal, extraocular movements intact  Respiratory: Patient's speak in full sentences and does not appear short of breath  Cardiovascular: No lower extremity edema, non tender, no  erythema  Skin: Warm dry intact with no signs of infection or rash on extremities or on axial skeleton.  Abdomen: Soft nontender  Neuro: Cranial nerves II through XII are intact, neurovascularly intact in all extremities with 2+ DTRs and 2+ pulses.  Lymph: No lymphadenopathy of posterior or anterior cervical chain or axillae bilaterally.  Gait normal with good balance and coordination.  MSK:  Non tender with full range of motion and good stability and symmetric strength and tone of shoulders, elbows, wrist, hip, knee and ankles bilaterally.  Neck exam shows loss of lordosis.  Patient does have some mild increase in kyphosis of the upper thoracic spine.  No radiation down the arms.  No numbness or tingling.  Full strength.  Tightness in the periscapular musculature.  Osteopathic findings C2 flexed rotated and side bent right C6 flexed rotated and side bent left T3 extended rotated and side bent right inhaled third rib L2 flexed rotated and side bent left     Impression and Recommendations:     This case required medical decision making of moderate complexity.      Note: This dictation was prepared with Dragon dictation along with smaller phrase technology. Any transcriptional errors that result from this process are unintentional.

## 2018-01-27 ENCOUNTER — Other Ambulatory Visit: Payer: Self-pay | Admitting: Neurology

## 2018-01-27 ENCOUNTER — Encounter: Payer: Self-pay | Admitting: Family Medicine

## 2018-01-27 ENCOUNTER — Ambulatory Visit: Payer: 59 | Admitting: Family Medicine

## 2018-01-27 VITALS — BP 120/90 | HR 92 | Ht 67.0 in | Wt 186.0 lb

## 2018-01-27 DIAGNOSIS — G4486 Cervicogenic headache: Secondary | ICD-10-CM

## 2018-01-27 DIAGNOSIS — M999 Biomechanical lesion, unspecified: Secondary | ICD-10-CM

## 2018-01-27 DIAGNOSIS — R51 Headache: Secondary | ICD-10-CM | POA: Diagnosis not present

## 2018-01-27 MED ORDER — VITAMIN D (ERGOCALCIFEROL) 1.25 MG (50000 UNIT) PO CAPS: 50000.0000 [IU] | ORAL_CAPSULE | ORAL | 0 refills | Status: DC

## 2018-01-27 MED FILL — VIT D2 1.25 MG (50,000 UNIT: 1.25 MG | 12 days supply | Qty: 12 | Fill #0

## 2018-01-27 NOTE — Patient Instructions (Addendum)
Good to see you  You will do fine.  Once weekly vitamin D for 12 weeks pennsaid pinkie amount topically 2 times daily as needed.  Ice is your friend.  Have fun at the wedding See me again in 3-4 weeks

## 2018-01-27 NOTE — Assessment & Plan Note (Signed)
Cervicogenic headaches.  Will start on once weekly vitamin D.  History of kidney stones but likely not a calcium citrate per patient.  Patient will monitor for any type of symptoms though.  Can help with patient's migraines.  Going to have Botox but not until August.  Discussed posture and ergonomics again.  Discussed topical anti-inflammatories.  Attempted osteopathic manipulation follow-up again in 3 to 4 weeks.

## 2018-01-27 NOTE — Assessment & Plan Note (Signed)
Decision today to treat with OMT was based on Physical Exam  After verbal consent patient was treated with HVLA, ME, FPR techniques in cervical, thoracic, rib lumbar and sacral areas  Patient tolerated the procedure well with improvement in symptoms  Patient given exercises, stretches and lifestyle modifications  See medications in patient instructions if given  Patient will follow up in 4 weeks 

## 2018-01-28 ENCOUNTER — Telehealth: Payer: Self-pay | Admitting: Neurology

## 2018-01-28 MED FILL — ELETRIPTAN HBR 40 MG TABLET: 40 | 30 days supply | Qty: 24 | Fill #0

## 2018-01-28 NOTE — Telephone Encounter (Signed)
[facility], spoke with Delilah Shan, advised her to change quantity to #[phone]

## 2018-01-29 DIAGNOSIS — M25632 Stiffness of left wrist, not elsewhere classified: Secondary | ICD-10-CM | POA: Diagnosis not present

## 2018-02-02 DIAGNOSIS — M25632 Stiffness of left wrist, not elsewhere classified: Secondary | ICD-10-CM | POA: Diagnosis not present

## 2018-02-08 DIAGNOSIS — M25632 Stiffness of left wrist, not elsewhere classified: Secondary | ICD-10-CM | POA: Diagnosis not present

## 2018-02-08 MED FILL — NORTRIPTYLINE HCL 50 MG CAP: 50 | 30 days supply | Qty: 60 | Fill #11

## 2018-02-17 DIAGNOSIS — Z4789 Encounter for other orthopedic aftercare: Secondary | ICD-10-CM | POA: Diagnosis not present

## 2018-02-17 DIAGNOSIS — S52532D Colles' fracture of left radius, subsequent encounter for closed fracture with routine healing: Secondary | ICD-10-CM | POA: Diagnosis not present

## 2018-02-17 DIAGNOSIS — M25632 Stiffness of left wrist, not elsewhere classified: Secondary | ICD-10-CM | POA: Diagnosis not present

## 2018-02-17 DIAGNOSIS — M25532 Pain in left wrist: Secondary | ICD-10-CM | POA: Diagnosis not present

## 2018-02-19 IMAGING — MR MR HEAD W/O CM
10 of 11 series · 34 of 48 positions shown · non-contrast
Comparison: None available.

CLINICAL DATA: Initial evaluation for chronic headache. Family
history of aneurysms.

EXAM:
MRI HEAD WITHOUT CONTRAST
MRA HEAD WITHOUT CONTRAST
TECHNIQUE: Multiplanar, multiecho pulse sequences of the brain and surrounding
structures were obtained without intravenous contrast. Angiographic
images of the head were obtained using MRA technique without
contrast.

[Series 5: T1 · sagittal · 4.0mm · 0.75mm/px · 1 of 31 slices shown (1 of 2)]
[im 1/31]
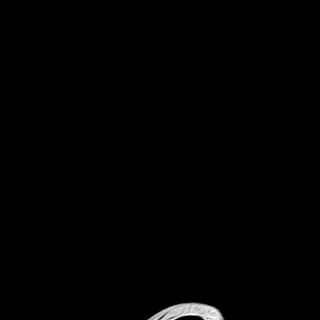

[Series 6: DWI · axial · 3.0mm · 1.44mm/px · z∈[-112,+23]mm · 6 of 84 slices shown (1 of 4)]
[im 1/84]
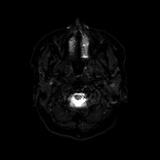
[im 17/84]
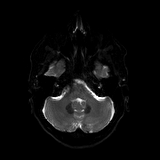
[im 34/84]
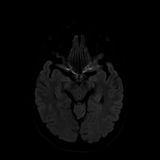
[im 50/84]
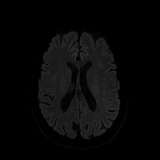
[im 67/84]
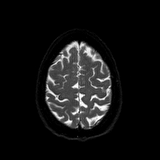
[im 84/84]
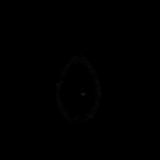

[Series 7: DWI · axial · 3.0mm · 1.44mm/px · z∈[-112,+23]mm · 3 of 42 slices shown (2 of 4)]
[im 1/42]
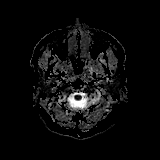
[im 21/42]
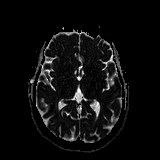
[im 42/42]
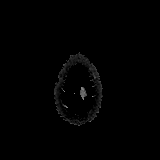

[Series 8: DWI · coronal · 5.0mm · 1.44mm/px · 4 of 60 slices shown (3 of 4)]
[im 1/60]
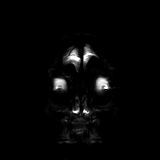
[im 20/60]
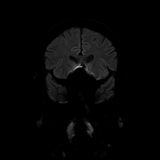
[im 40/60]
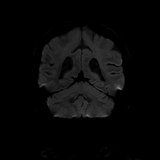
[im 60/60]
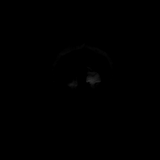

[Series 9: DWI · coronal · 5.0mm · 1.44mm/px · 2 of 30 slices shown (4 of 4)]
[im 1/30]
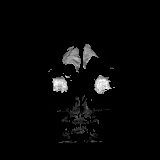
[im 30/30]
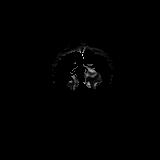

[Series 13: tof_[id number]_multi-slab · axial · 0.6mm · 0.26mm/px · z∈[-108,-69]mm · 4 of 162 slices shown]
[im 1/162]
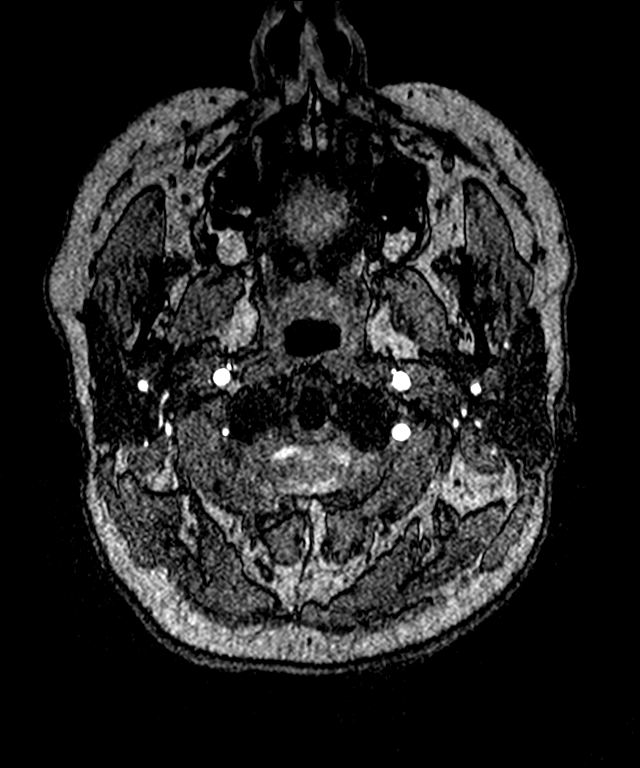
[im 33/162]
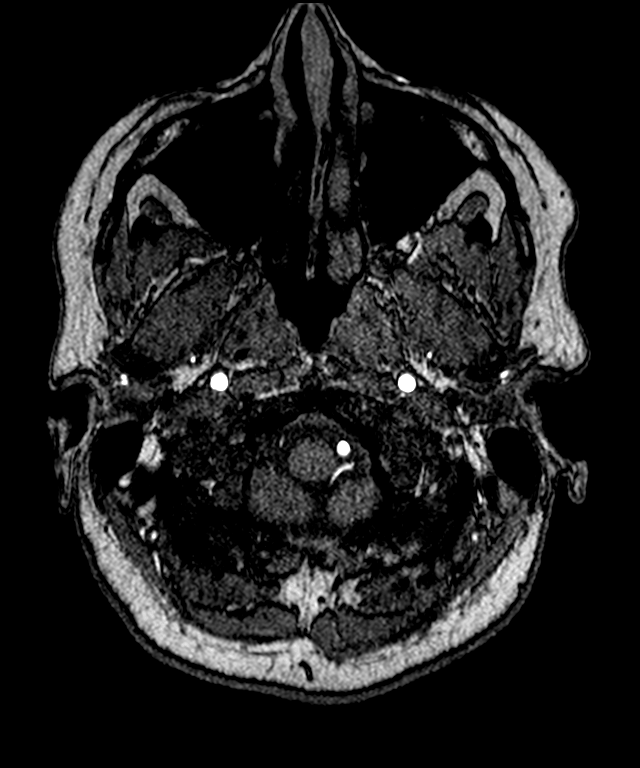
[im 49/162]
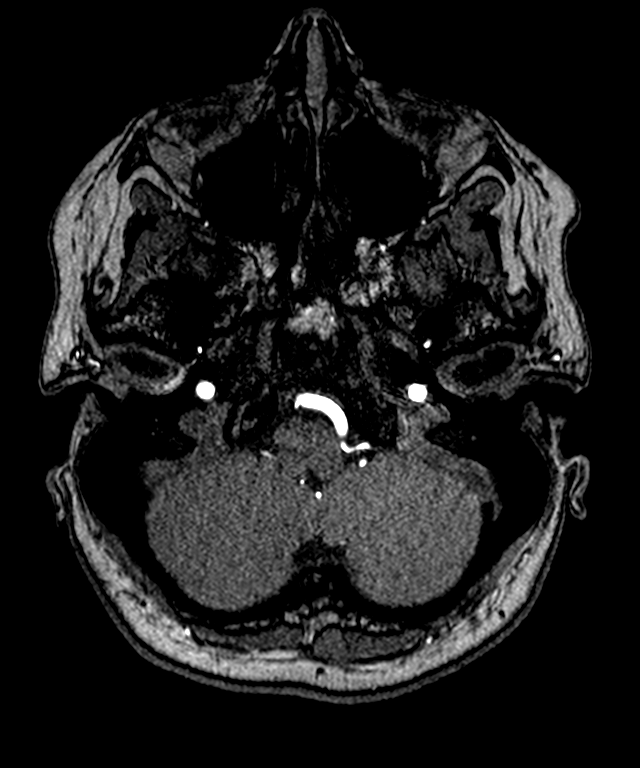
[im 65/162]
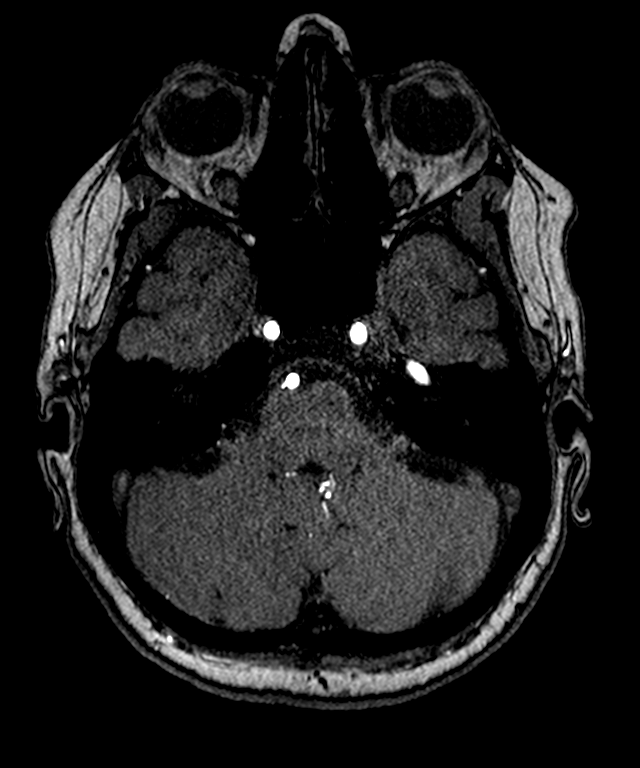

[Series 18: T2 · axial · 4.0mm · 0.36mm/px · z∈[-114,+21]mm · 2 of 27 slices shown (1 of 2)]
[im 1/27]
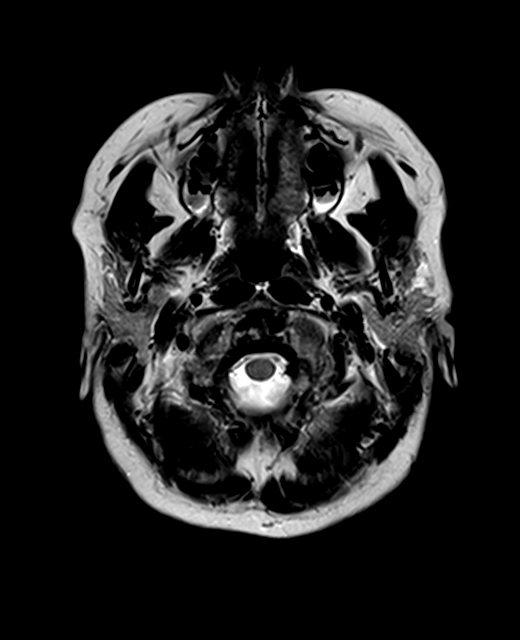
[im 27/27]
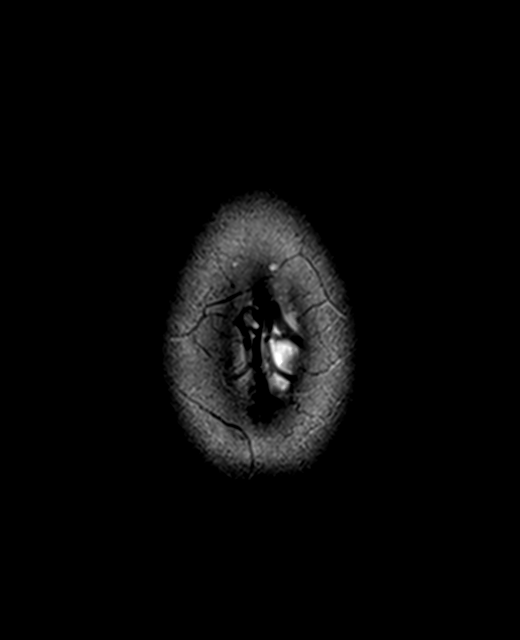

[Series 19: FLAIR · axial · 3.0mm · 0.72mm/px · z∈[-122,+28]mm · 2 of 26 slices shown]
[im 1/26]
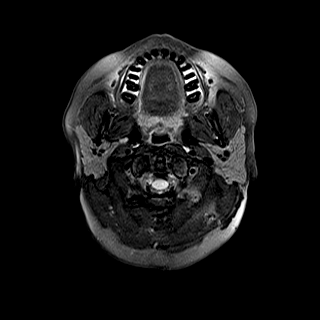
[im 26/26]
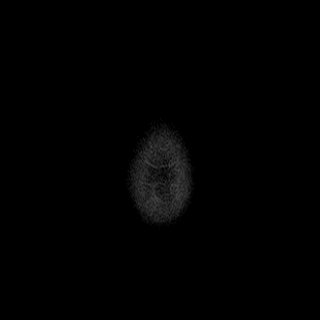

[Series 22: T1 · axial · 1.0mm · 0.90mm/px · z∈[-118,+25]mm · 8 of 144 slices shown (2 of 2)]
[im 1/144]
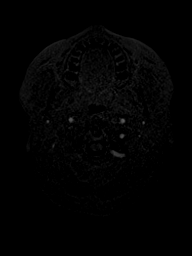
[im 18/144]
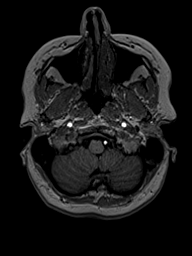
[im 36/144]
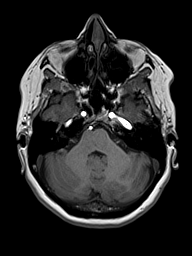
[im 54/144]
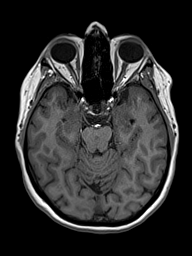
[im 90/144]
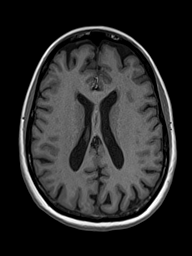
[im 108/144]
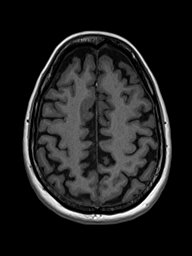
[im 126/144]
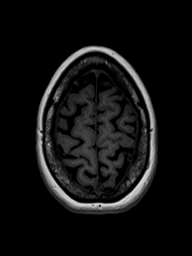
[im 144/144]
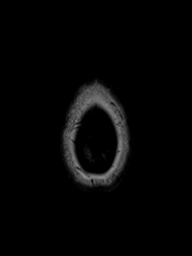

[Series 23: T2 · coronal · 4.5mm · 0.36mm/px · 2 of 30 slices shown (2 of 2)]
[im 1/30]
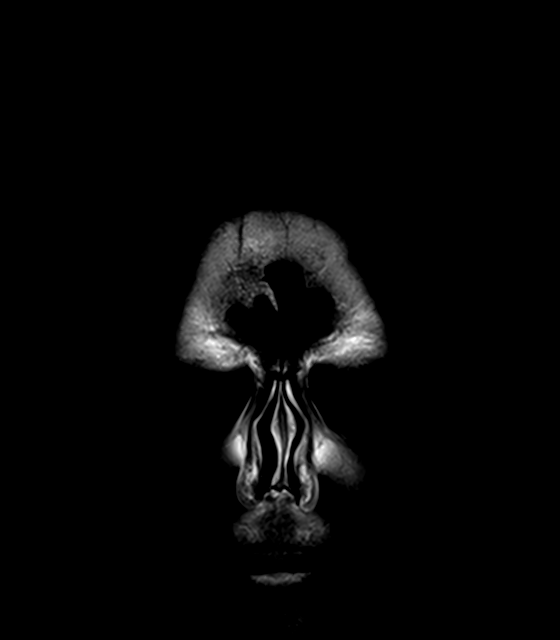
[im 30/30]
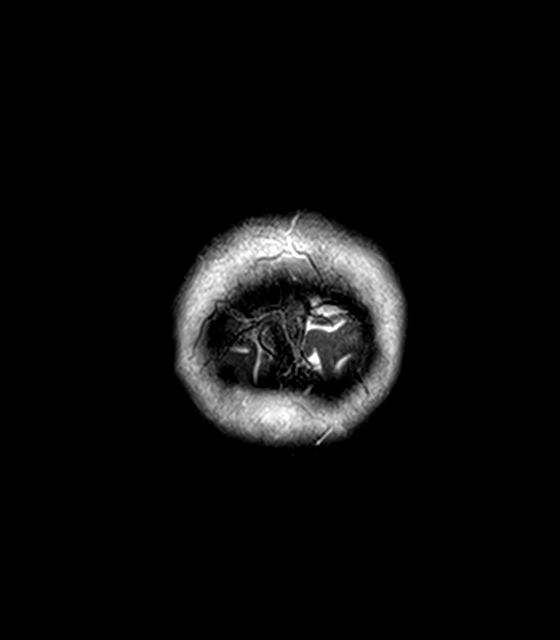

[34 of 48 positions shown; findings below may reference images not displayed]

FINDINGS: MRI HEAD FINDINGS

Brain: Generalized age-related cerebral atrophy. Few scattered
subcentimeter T2/FLAIR hyperintense foci noted within the
periventricular, deep, and subcortical white matter both cerebral
hemispheres, nonspecific, but mild for age. No abnormal foci of
restricted diffusion to suggest acute or subacute ischemia.
Gray-white matter differentiation well maintained. No
encephalomalacia to suggest chronic infarction. Single punctate
subcentimeter focus of susceptibility fact noted within the mesial
left temporal lobe, of doubtful significance in isolation. No other
evidence for acute or chronic intracranial hemorrhage.

No mass lesion, midline shift or mass effect. Ventricles normal size
without evidence for hydrocephalus. No extra-axial fluid collection.
Major dural sinuses are patent.

Pituitary gland suprasellar region normal. Midline structures intact
and normal.

Vascular: Major intracranial vascular flow voids are well
maintained. Right vertebral artery hypoplastic.

Skull and upper cervical spine: Craniocervical junction normal.
Visualized upper cervical spine within normal limits. Bone marrow
signal intensity normal. No scalp soft tissue abnormality.

Sinuses/Orbits: Globes and orbital soft tissues within normal
limits. Paranasal sinuses are clear. No mastoid effusion. Inner ear
structures normal.

Other: None.

MRA HEAD FINDINGS

ANTERIOR CIRCULATION:

Distal cervical segments of the internal carotid artery is are
widely patent with antegrade flow. Petrous, cavernous, and
supraclinoid segments widely patent without stenosis. A1 segments
widely patent. Normal anterior communicating artery. Anterior
cerebral arteries widely patent to their distal aspects without
stenosis. M1 segments widely patent without stenosis or occlusion.
Normal MCA bifurcations. Distal MCA branches well opacified and
symmetric bilaterally.

POSTERIOR CIRCULATION:

Left vertebral artery dominant and widely patent to the
vertebrobasilar junction without stenosis right vertebral artery
hypoplastic, and largely terminates in PICA. Posterior inferior
cerebral arteries themselves are widely patent. Basilar artery
widely patent to its distal aspect. Superior cerebral arteries
patent bilaterally. Both of the posterior cerebral arteries
primarily supplied via the basilar artery and are well opacified to
their distal aspects. Small bilateral posterior communicating
arteries noted.

No aneurysm or vascular malformation.
IMPRESSION: MRI HEAD IMPRESSION:

1. Scattered subcentimeter T2/FLAIR hyperintense foci involving the
supratentorial cerebral white matter, nonspecific, but overall mild
for age. Differential considerations include sequelae of chronic
small vessel ischemia, changes related to migrainous disorder,
vasculitis, or prior infectious or inflammatory process. These would
not be typical for underlying demyelinating disease.
2. Otherwise normal brain MRI. No acute intracranial abnormality
identified.

MRA HEAD IMPRESSION:

1. Normal intracranial MRA. No aneurysm or other vascular
abnormality.
2. Hypoplastic right vertebral artery terminates in PICA.

## 2018-02-19 MED FILL — LEVOTHYROXINE 88 MCG TABLET: 88 | 90 days supply | Qty: 90 | Fill #0

## 2018-02-22 MED FILL — ELETRIPTAN HBR 40 MG TABLET: 40 | 30 days supply | Qty: 24 | Fill #1

## 2018-02-24 DIAGNOSIS — M25632 Stiffness of left wrist, not elsewhere classified: Secondary | ICD-10-CM | POA: Diagnosis not present

## 2018-02-24 NOTE — Progress Notes (Signed)
[name] [place] [place], [place] [address] Phone: [phone] Subjective:      CC: Neck pain  [id number]  [name] is a 52 y.o. female coming in with complaint of neck pain. She continues to have headaches daily. Patient has been trying Excedrin to alleviate her headache. Has become nauseous due to the pain. States that she has been traveling recently and has some tightness due to carrying bags around and having to push her mother in a wheelchair.       Past Medical History:  Diagnosis Date  . History of ectopic pregnancy    04/ 2007  S/P LEFT SALPINGECTOMY  . History of kidney stones   . Hypothyroidism   . Menorrhagia   . Migraine    Past Surgical History:  Procedure Laterality Date  . CARPAL TUNNEL RELEASE Left 12/05/2017   Procedure: OPEN REDUCTION INTERNAL FIXATION (ORIF) WRIST FRACTURE WITH CARPAL TUNNEL RELEASE;  Surgeon: Roseanne Kaufman, MD;  Location: [facility];  Service: Orthopedics;  Laterality: Left;  . DILATATION & CURETTAGE/HYSTEROSCOPY WITH MYOSURE  2016  . DILATATION & CURETTAGE/HYSTEROSCOPY WITH MYOSURE N/A 10/23/2016   Procedure: DILATATION & CURETTAGE/HYSTEROSCOPY;  Surgeon: Everlene Farrier, MD;  Location: [facility];  Service: Gynecology;  Laterality: N/A;  . DILATION AND EVACUATION  10/07/2007   retained poc  . LAPAROSCOPIC UNILATERAL SALPINGECTOMY Left 11/24/2005   Social History   Socioeconomic History  . Marital status: Widowed    Spouse name: Not on file  . Number of children: 0  . Years of education: 56  . Highest education level: Not on file  Occupational History  . Occupation: [employer]  Social Needs  . Financial resource strain: Not on file  . Food insecurity:    Worry: Not on file    Inability: Not on file  . Transportation needs:    Medical: Not on file    Non-medical: Not on file  Tobacco Use  . Smoking status: Never Smoker  . Smokeless tobacco: Never Used   Substance and Sexual Activity  . Alcohol use: No  . Drug use: No  . Sexual activity: Not on file  Lifestyle  . Physical activity:    Days per week: Not on file    Minutes per session: Not on file  . Stress: Not on file  Relationships  . Social connections:    Talks on phone: Not on file    Gets together: Not on file    Attends religious service: Not on file    Active member of club or organization: Not on file    Attends meetings of clubs or organizations: Not on file    Relationship status: Not on file  Other Topics Concern  . Not on file  Social History Narrative   Lives at home alone   Right-handed   Caffeine: weekly   No Known Allergies Family History  Problem Relation Age of Onset  . Hypertension Mother   . Aortic aneurysm Mother   . Liver cancer Father   . Prostate cancer Brother   . Migraines Sister      Past medical history, social, surgical and family history all reviewed in electronic medical record.  No pertanent information unless stated regarding to the chief complaint.   Review of Systems:Review of systems updated and as accurate as of 02/25/18  No headache, visual changes, nausea, vomiting, diarrhea, constipation, dizziness, abdominal pain, skin rash, fevers, chills, night sweats, weight loss, swollen  lymph nodes, body aches, joint swelling,  chest pain, shortness of breath, mood changes.  Positive muscle aches  Objective  Blood pressure 128/90, pulse 95, height 5\' 7"  (1.702 m), weight 186 lb (84.4 kg), SpO2 98 %. Systems examined below as of 02/25/18   General: No apparent distress alert and oriented x3 mood and affect normal, dressed appropriately.  HEENT: Pupils equal, extraocular movements intact  Respiratory: Patient's speak in full sentences and does not appear short of breath  Cardiovascular: No lower extremity edema, non tender, no erythema  Skin: Warm dry intact with no signs of infection or rash on extremities or on axial skeleton.  Abdomen:  Soft nontender  Neuro: Cranial nerves II through XII are intact, neurovascularly intact in all extremities with 2+ DTRs and 2+ pulses.  Lymph: No lymphadenopathy of posterior or anterior cervical chain or axillae bilaterally.  Gait normal with good balance and coordination.  MSK:  Non tender with full range of motion and good stability and symmetric strength and tone of shoulders, elbows, wrist, hip, knee and ankles bilaterally.   Neck: Inspection loss of lordosis. No palpable stepoffs. Negative Spurling's maneuver. Mild limitation in sidebending bilaterally Grip strength and sensation normal in bilateral hands Strength good C4 to T1 distribution No sensory change to C4 to T1 Negative Hoffman sign bilaterally Reflexes normal Tightness of the trapezius bilaterally  Osteopathic findings  C2 flexed rotated and side bent right C7 flexed rotated and side bent left T3 extended rotated and side bent right inhaled third rib T6 extended rotated and side bent left L2 flexed rotated and side bent right Sacrum left on left      Impression and Recommendations:     This case required medical decision making of moderate complexity.      Note: This dictation was prepared with Dragon dictation along with smaller phrase technology. Any transcriptional errors that result from this process are unintentional.

## 2018-02-25 ENCOUNTER — Ambulatory Visit: Payer: 59 | Admitting: Family Medicine

## 2018-02-25 ENCOUNTER — Encounter: Payer: Self-pay | Admitting: Family Medicine

## 2018-02-25 VITALS — BP 128/90 | HR 95 | Ht 67.0 in | Wt 186.0 lb

## 2018-02-25 DIAGNOSIS — G4486 Cervicogenic headache: Secondary | ICD-10-CM

## 2018-02-25 DIAGNOSIS — M999 Biomechanical lesion, unspecified: Secondary | ICD-10-CM | POA: Diagnosis not present

## 2018-02-25 DIAGNOSIS — R51 Headache: Secondary | ICD-10-CM

## 2018-02-25 NOTE — Patient Instructions (Signed)
Good to see you  Gatorade 1/2 cup with 1 cup of water through the day  Keep up everything else See me again in 3-4 weeks

## 2018-02-25 NOTE — Assessment & Plan Note (Signed)
Patient did have some worsening symptoms but was not doing home exercises.  Discussed icing regimen in which wants to do.  We discussed avoiding certain activities.  Patient will follow-up again in 4 to 6 weeks

## 2018-02-25 NOTE — Assessment & Plan Note (Signed)
Decision today to treat with OMT was based on Physical Exam  After verbal consent patient was treated with HVLA, ME, FPR techniques in cervical, thoracic, lumbar and sacral areas  Patient tolerated the procedure well with improvement in symptoms  Patient given exercises, stretches and lifestyle modifications  See medications in patient instructions if given  Patient will follow up in 4-6 weeks 

## 2018-03-03 DIAGNOSIS — M25632 Stiffness of left wrist, not elsewhere classified: Secondary | ICD-10-CM | POA: Diagnosis not present

## 2018-03-08 ENCOUNTER — Telehealth: Payer: Self-pay

## 2018-03-08 NOTE — Telephone Encounter (Signed)
Called Pt to determine if she still has Botox to bring to appt, or if she never picked it up, do I need to call in another Rx? LMOVM asking her to return my call

## 2018-03-10 DIAGNOSIS — M25632 Stiffness of left wrist, not elsewhere classified: Secondary | ICD-10-CM | POA: Diagnosis not present

## 2018-03-10 MED FILL — NORTRIPTYLINE HCL 50 MG CAP: 50 | 30 days supply | Qty: 60 | Fill #0

## 2018-03-16 DIAGNOSIS — N951 Menopausal and female climacteric states: Secondary | ICD-10-CM | POA: Diagnosis not present

## 2018-03-16 DIAGNOSIS — Z13820 Encounter for screening for osteoporosis: Secondary | ICD-10-CM | POA: Diagnosis not present

## 2018-03-16 DIAGNOSIS — Z6830 Body mass index (BMI) 30.0-30.9, adult: Secondary | ICD-10-CM | POA: Diagnosis not present

## 2018-03-16 DIAGNOSIS — Z01419 Encounter for gynecological examination (general) (routine) without abnormal findings: Secondary | ICD-10-CM | POA: Diagnosis not present

## 2018-03-17 DIAGNOSIS — M25632 Stiffness of left wrist, not elsewhere classified: Secondary | ICD-10-CM | POA: Diagnosis not present

## 2018-03-24 DIAGNOSIS — S52532D Colles' fracture of left radius, subsequent encounter for closed fracture with routine healing: Secondary | ICD-10-CM | POA: Diagnosis not present

## 2018-03-24 DIAGNOSIS — M25532 Pain in left wrist: Secondary | ICD-10-CM | POA: Diagnosis not present

## 2018-03-24 DIAGNOSIS — M25632 Stiffness of left wrist, not elsewhere classified: Secondary | ICD-10-CM | POA: Diagnosis not present

## 2018-03-24 MED FILL — ELETRIPTAN HBR 40 MG TABLET: 40 | 30 days supply | Qty: 24 | Fill #2

## 2018-04-09 ENCOUNTER — Ambulatory Visit: Payer: 59 | Admitting: Neurology

## 2018-04-13 MED FILL — NORTRIPTYLINE HCL 50 MG CAP: 50 | 30 days supply | Qty: 60 | Fill #1

## 2018-04-15 ENCOUNTER — Telehealth: Payer: 59 | Admitting: Family

## 2018-04-15 DIAGNOSIS — B9689 Other specified bacterial agents as the cause of diseases classified elsewhere: Secondary | ICD-10-CM

## 2018-04-15 DIAGNOSIS — N760 Acute vaginitis: Secondary | ICD-10-CM | POA: Diagnosis not present

## 2018-04-15 MED ORDER — METRONIDAZOLE 500 MG PO TABS: 500.0000 mg | ORAL_TABLET | Freq: Two times a day (BID) | ORAL | 0 refills | Status: DC

## 2018-04-15 MED FILL — metroNIDAZOLE 500 MG TABS: 500 | 7 days supply | Qty: 14 | Fill #0

## 2018-04-15 NOTE — Progress Notes (Signed)
Thank you for the details you included in the comment boxes. Those details are very helpful in determining the best course of treatment for you and help us to provide the best care.  We are sorry that you are not feeling well. Here is how we plan to help! Based on what you shared with me it looks like you: May have a vaginosis due to bacteria  Vaginosis is an inflammation of the vagina that can result in discharge, itching and pain. The cause is usually a change in the normal balance of vaginal bacteria or an infection. Vaginosis can also result from reduced estrogen levels after menopause.  The most common causes of vaginosis are:   Bacterial vaginosis which results from an overgrowth of one on several organisms that are normally present in your vagina.   Yeast infections which are caused by a naturally occurring fungus called candida.   Vaginal atrophy (atrophic vaginosis) which results from the thinning of the vagina from reduced estrogen levels after menopause.   Trichomoniasis which is caused by a parasite and is commonly transmitted by sexual intercourse.  Factors that increase your risk of developing vaginosis include: . Medications, such as antibiotics and steroids . Uncontrolled diabetes . Use of hygiene products such as bubble bath, vaginal spray or vaginal deodorant . Douching . Wearing damp or tight-fitting clothing . Using an intrauterine device (IUD) for birth control . Hormonal changes, such as those associated with pregnancy, birth control pills or menopause . Sexual activity . Having a sexually transmitted infection  Your treatment plan is Metronidazole or Flagyl 500mg twice a day for 7 days.  I have electronically sent this prescription into the pharmacy that you have chosen.  Be sure to take all of the medication as directed. Stop taking any medication if you develop a rash, tongue swelling or shortness of breath. Mothers who are breast feeding should consider pumping  and discarding their breast milk while on these antibiotics. However, there is no consensus that infant exposure at these doses would be harmful.  Remember that medication creams can weaken latex condoms. .   HOME CARE:  Good hygiene may prevent some types of vaginosis from recurring and may relieve some symptoms:  . Avoid baths, hot tubs and whirlpool spas. Rinse soap from your outer genital area after a shower, and dry the area well to prevent irritation. Don't use scented or harsh soaps, such as those with deodorant or antibacterial action. . Avoid irritants. These include scented tampons and pads. . Wipe from front to back after using the toilet. Doing so avoids spreading fecal bacteria to your vagina.  Other things that may help prevent vaginosis include:  . Don't douche. Your vagina doesn't require cleansing other than normal bathing. Repetitive douching disrupts the normal organisms that reside in the vagina and can actually increase your risk of vaginal infection. Douching won't clear up a vaginal infection. . Use a latex condom. Both female and female latex condoms may help you avoid infections spread by sexual contact. . Wear cotton underwear. Also wear pantyhose with a cotton crotch. If you feel comfortable without it, skip wearing underwear to bed. Yeast thrives in moist environments Your symptoms should improve in the next day or two.  GET HELP RIGHT AWAY IF:  . You have pain in your lower abdomen ( pelvic area or over your ovaries) . You develop nausea or vomiting . You develop a fever . Your discharge changes or worsens . You have persistent pain with   intercourse . You develop shortness of breath, a rapid pulse, or you faint.  These symptoms could be signs of problems or infections that need to be evaluated by a medical provider now.  MAKE SURE YOU    Understand these instructions.  Will watch your condition.  Will get help right away if you are not doing well or  get worse.  Your e-visit answers were reviewed by a board certified advanced clinical practitioner to complete your personal care plan. Depending upon the condition, your plan could have included both over the counter or prescription medications. Please review your pharmacy choice to make sure that you have choses a pharmacy that is open for you to pick up any needed prescription, Your safety is important to us. If you have drug allergies check your prescription carefully.   You can use MyChart to ask questions about today's visit, request a non-urgent call back, or ask for a work or school excuse for 24 hours related to this e-Visit. If it has been greater than 24 hours you will need to follow up with your provider, or enter a new e-Visit to address those concerns. You will get a MyChart message within the next two days asking about your experience. I hope that your e-visit has been valuable and will speed your recovery.  

## 2018-04-21 MED FILL — ELETRIPTAN HYDROBROMIDE 40: 40 | 30 days supply | Qty: 24 | Fill #3

## 2018-05-03 MED FILL — BOTOX 200 UNITS VIAL: 200 | 1 days supply | Qty: 1 | Fill #0

## 2018-05-07 ENCOUNTER — Ambulatory Visit (INDEPENDENT_AMBULATORY_CARE_PROVIDER_SITE_OTHER): Payer: 59 | Admitting: Neurology

## 2018-05-07 DIAGNOSIS — G43709 Chronic migraine without aura, not intractable, without status migrainosus: Secondary | ICD-10-CM

## 2018-05-07 MED ORDER — ONABOTULINUMTOXINA 100 UNITS IJ SOLR
200.0000 [IU] | Freq: Once | INTRAMUSCULAR | Status: AC
  Administered 2018-05-07: 200 [IU] via INTRAMUSCULAR

## 2018-05-07 NOTE — Progress Notes (Signed)
[facility]   Procedure Note Botox  Attending: Dr. Tomi Likens  Preoperative Diagnosis(es): Chronic migraine  Consent obtained from: The patient Benefits discussed included, but were not limited to decreased muscle tightness, increased joint range of motion, and decreased pain.  Risk discussed included, but were not limited pain and discomfort, bleeding, bruising, excessive weakness, venous thrombosis, muscle atrophy and dysphagia.  Anticipated outcomes of the procedure as well as he risks and benefits of the alternatives to the procedure, and the roles and tasks of the personnel to be involved, were discussed with the patient, and the patient consents to the procedure and agrees to proceed. A copy of the patient medication guide was given to the patient which explains the blackbox warning.  Patients identity and treatment sites confirmed Yes.  .  Details of Procedure: Skin was cleaned with alcohol. Prior to injection, the needle plunger was aspirated to make sure the needle was not within a blood vessel.  There was no blood retrieved on aspiration.    Following is a summary of the muscles injected  And the amount of Botulinum toxin used:  Dilution 200 units of Botox was reconstituted with 4 ml of preservative free normal saline. Time of reconstitution: At the time of the office visit (<30 minutes prior to injection)   Injections  155 total units of Botox was injected with a 30 gauge needle.  Injection Sites: L occipitalis: 15 units- 3 sites  R occiptalis: 15 units- 3 sites  L upper trapezius: 15 units- 3 sites R upper trapezius: 15 units- 3 sits          L paraspinal: 10 units- 2 sites R paraspinal: 10 units- 2 sites  Face L frontalis(2 injection sites):10 units   R frontalis(2 injection sites):10 units         L corrugator: 5 units   R corrugator: 5 units           Procerus: 5 units   L temporalis: 20 units R temporalis: 20 units   Agent:  200 units of botulinum Type A  (Onobotulinum Toxin type A) was reconstituted with 4 ml of preservative free normal saline.  Time of reconstitution: At the time of the office visit (<30 minutes prior to injection)     Total injected (Units): 155  Total wasted (Units): No waste  Patient tolerated procedure well without complications.   Reinjection is anticipated in 3 months. Return to clinic in 4 1/2 months.

## 2018-05-12 MED FILL — NORTRIPTYLINE HCL 50 MG CAP: 50 | 30 days supply | Qty: 60 | Fill #2

## 2018-05-20 ENCOUNTER — Other Ambulatory Visit: Payer: Self-pay | Admitting: Neurology

## 2018-05-20 MED FILL — LEVOTHYROXINE 88 MCG TABLET: 88 | 90 days supply | Qty: 90 | Fill #1

## 2018-05-20 MED FILL — ELETRIPTAN HYDROBROMIDE 40: 40 | 30 days supply | Qty: 24 | Fill #0

## 2018-06-10 ENCOUNTER — Other Ambulatory Visit: Payer: Self-pay | Admitting: Neurology

## 2018-06-10 MED FILL — NORTRIPTYLINE HCL 50 MG CAP: 50 | 30 days supply | Qty: 60 | Fill #0

## 2018-06-15 MED FILL — ELETRIPTAN HYDROBROMIDE 40: 40 | 30 days supply | Qty: 24 | Fill #1

## 2018-07-12 MED FILL — ELETRIPTAN HYDROBROMIDE 40: 40 | 30 days supply | Qty: 24 | Fill #2

## 2018-07-12 MED FILL — NORTRIPTYLINE HCL 50 MG CAP: 50 | 30 days supply | Qty: 60 | Fill #1

## 2018-07-28 ENCOUNTER — Other Ambulatory Visit: Payer: Self-pay | Admitting: Neurology

## 2018-07-28 MED FILL — BOTOX 200 UNITS VIAL: 200 | 1 days supply | Qty: 1 | Fill #0

## 2018-08-02 ENCOUNTER — Encounter: Payer: Self-pay | Admitting: *Deleted

## 2018-08-02 NOTE — Progress Notes (Signed)
[name] and verified Botox is ready for pickup at Pharmacy prior to her appointment on 08/13/2018. No refills we will need to call in refills for Botox appointments beyond 08/13/2018.

## 2018-08-06 MED FILL — ELETRIPTAN HYDROBROMIDE 40: 40 | 60 days supply | Qty: 24 | Fill #3

## 2018-08-09 DIAGNOSIS — Z1231 Encounter for screening mammogram for malignant neoplasm of breast: Secondary | ICD-10-CM | POA: Diagnosis not present

## 2018-08-09 NOTE — Progress Notes (Signed)
Prior Authorization initiated via [email or web address] for pt's   ELETRIPTAN HYDROBROMIDE

## 2018-08-12 ENCOUNTER — Telehealth: Payer: 59 | Admitting: Family

## 2018-08-12 DIAGNOSIS — N390 Urinary tract infection, site not specified: Secondary | ICD-10-CM

## 2018-08-12 MED ORDER — CEPHALEXIN 500 MG PO CAPS: 500.0000 mg | ORAL_CAPSULE | Freq: Two times a day (BID) | ORAL | 0 refills | Status: DC

## 2018-08-12 MED FILL — CEPHALEXIN 500 MG CAPSULE: 500 | 7 days supply | Qty: 14 | Fill #0

## 2018-08-12 NOTE — Progress Notes (Signed)

## 2018-08-12 NOTE — Progress Notes (Signed)
Received notice from [email or web address] that pt's ELETRIPTAN HYDROBROMIDE has been   Approved 08/12/2018 to 08/12/2019

## 2018-08-13 ENCOUNTER — Other Ambulatory Visit: Payer: Self-pay | Admitting: Neurology

## 2018-08-13 ENCOUNTER — Ambulatory Visit (INDEPENDENT_AMBULATORY_CARE_PROVIDER_SITE_OTHER): Payer: 59 | Admitting: Neurology

## 2018-08-13 DIAGNOSIS — G43709 Chronic migraine without aura, not intractable, without status migrainosus: Secondary | ICD-10-CM | POA: Diagnosis not present

## 2018-08-13 MED ORDER — ONABOTULINUMTOXINA 100 UNITS IJ SOLR
200.0000 [IU] | Freq: Once | INTRAMUSCULAR | Status: AC
  Administered 2018-08-13: 200 [IU] via INTRAMUSCULAR

## 2018-08-13 NOTE — Progress Notes (Signed)
[facility]   Procedure Note Botox  Attending: Dr. Tomi Likens  Preoperative Diagnosis(es): Chronic migraine  Consent obtained from: The patient Benefits discussed included, but were not limited to decreased muscle tightness, increased joint range of motion, and decreased pain.  Risk discussed included, but were not limited pain and discomfort, bleeding, bruising, excessive weakness, venous thrombosis, muscle atrophy and dysphagia.  Anticipated outcomes of the procedure as well as he risks and benefits of the alternatives to the procedure, and the roles and tasks of the personnel to be involved, were discussed with the patient, and the patient consents to the procedure and agrees to proceed. A copy of the patient medication guide was given to the patient which explains the blackbox warning.  Patients identity and treatment sites confirmed Yes.  .  Details of Procedure: Skin was cleaned with alcohol. Prior to injection, the needle plunger was aspirated to make sure the needle was not within a blood vessel.  There was no blood retrieved on aspiration.    Following is a summary of the muscles injected  And the amount of Botulinum toxin used:  Dilution 200 units of Botox was reconstituted with 4 ml of preservative free normal saline. Time of reconstitution: At the time of the office visit (<30 minutes prior to injection)   Injections  155 total units of Botox was injected with a 30 gauge needle.  Injection Sites: L occipitalis: 15 units- 3 sites  R occiptalis: 15 units- 3 sites  L upper trapezius: 15 units- 3 sites R upper trapezius: 15 units- 3 sits          L paraspinal: 10 units- 2 sites R paraspinal: 10 units- 2 sites  Face L frontalis(2 injection sites):10 units   R frontalis(2 injection sites):10 units         L corrugator: 5 units   R corrugator: 5 units           Procerus: 5 units   L temporalis: 20 units R temporalis: 20 units   Agent:  200 units of botulinum Type A  (Onobotulinum Toxin type A) was reconstituted with 4 ml of preservative free normal saline.  Time of reconstitution: At the time of the office visit (<30 minutes prior to injection)     Total injected (Units): 155  Total wasted (Units): 45  Patient tolerated procedure well without complications.   Reinjection is anticipated in 3 months. Return to clinic in 2 months

## 2018-08-18 MED FILL — LEVOTHYROXINE 88 MCG TABLET: 88 | 90 days supply | Qty: 90 | Fill #2

## 2018-08-30 ENCOUNTER — Ambulatory Visit: Payer: 59 | Admitting: Family Medicine

## 2018-08-30 ENCOUNTER — Encounter: Payer: Self-pay | Admitting: Family Medicine

## 2018-08-30 VITALS — BP 158/90 | HR 97 | Ht 67.0 in | Wt 189.0 lb

## 2018-08-30 DIAGNOSIS — M999 Biomechanical lesion, unspecified: Secondary | ICD-10-CM

## 2018-08-30 DIAGNOSIS — R51 Headache: Secondary | ICD-10-CM | POA: Diagnosis not present

## 2018-08-30 DIAGNOSIS — G4486 Cervicogenic headache: Secondary | ICD-10-CM

## 2018-08-30 MED FILL — ELETRIPTAN HYDROBROMIDE 40: 40 | 30 days supply | Qty: 24 | Fill #0

## 2018-08-30 NOTE — Patient Instructions (Signed)
Good to see you  Overall you should do well  Consider a sleep study  Calcium pyruvate 1500mg  daily can help as well.  I think you are so close to getting better! Keep it up  See me again in 5-6 weeks

## 2018-08-30 NOTE — Assessment & Plan Note (Signed)
Decision today to treat with OMT was based on Physical Exam  After verbal consent patient was treated with HVLA, ME, FPR techniques in cervical, rib thoracic, lumbar and sacral areas  Patient tolerated the procedure well with improvement in symptoms  Patient given exercises, stretches and lifestyle modifications  See medications in patient instructions if given  Patient will follow up in 8 weeks

## 2018-08-30 NOTE — Assessment & Plan Note (Signed)
Cervicogenic headaches.  Discussed icing regimen and home exercise.  Discussed which activities to do which wants to avoid.  Patient is to increase activity slowly over the course the next several days.  Discussed avoiding certain things.  Patient also is having trouble with nighttime awakenings with the headaches.  We may want to consider the possibility of a sleep study.

## 2018-08-30 NOTE — Progress Notes (Signed)
[name] [address], [place] [address] Phone: [phone] Subjective:    I [name] am serving as a Education administrator for Dr. Hulan Saas.  \ CC: Back pain follow-up and neck pain  LFY:[id number]  [name] is a 53 y.o. female coming in with complaint of back pain. States that her headaches are still bad.  Patient has been receiving Botox injections with mixed results so far.  Does not know if it is making significant improvement yet.  Patient is also doing home exercises intermittently.  Patient is on nortriptyline at night and still seems to be giving some improvement but not complete improvement.  Sometimes can wake up with a headache.  Denies any associated nausea most of the time.     Past Medical History:  Diagnosis Date  . History of ectopic pregnancy    04/ 2007  S/P LEFT SALPINGECTOMY  . History of kidney stones   . Hypothyroidism   . Menorrhagia   . Migraine    Past Surgical History:  Procedure Laterality Date  . CARPAL TUNNEL RELEASE Left 12/05/2017   Procedure: OPEN REDUCTION INTERNAL FIXATION (ORIF) WRIST FRACTURE WITH CARPAL TUNNEL RELEASE;  Surgeon: Roseanne Kaufman, MD;  Location: [facility];  Service: Orthopedics;  Laterality: Left;  . DILATATION & CURETTAGE/HYSTEROSCOPY WITH MYOSURE  2016  . DILATATION & CURETTAGE/HYSTEROSCOPY WITH MYOSURE N/A 10/23/2016   Procedure: DILATATION & CURETTAGE/HYSTEROSCOPY;  Surgeon: Everlene Farrier, MD;  Location: [facility];  Service: Gynecology;  Laterality: N/A;  . DILATION AND EVACUATION  10/07/2007   retained poc  . LAPAROSCOPIC UNILATERAL SALPINGECTOMY Left 11/24/2005   Social History   Socioeconomic History  . Marital status: Widowed    Spouse name: Not on file  . Number of children: 0  . Years of education: 79  . Highest education level: Not on file  Occupational History  . Occupation: [employer]  Social Needs  . Financial resource strain: Not on  file  . Food insecurity:    Worry: Not on file    Inability: Not on file  . Transportation needs:    Medical: Not on file    Non-medical: Not on file  Tobacco Use  . Smoking status: Never Smoker  . Smokeless tobacco: Never Used  Substance and Sexual Activity  . Alcohol use: No  . Drug use: No  . Sexual activity: Not on file  Lifestyle  . Physical activity:    Days per week: Not on file    Minutes per session: Not on file  . Stress: Not on file  Relationships  . Social connections:    Talks on phone: Not on file    Gets together: Not on file    Attends religious service: Not on file    Active member of club or organization: Not on file    Attends meetings of clubs or organizations: Not on file    Relationship status: Not on file  Other Topics Concern  . Not on file  Social History Narrative   Lives at home alone   Right-handed   Caffeine: weekly   No Known Allergies Family History  Problem Relation Age of Onset  . Hypertension Mother   . Aortic aneurysm Mother   . Liver cancer Father   . Prostate cancer Brother   . Migraines Sister     Current Outpatient Medications (Endocrine & Metabolic):  .  levothyroxine (SYNTHROID, LEVOTHROID) 88 MCG tablet, Take 88 mcg by mouth  daily before breakfast.    Current Outpatient Medications (Analgesics):  .  eletriptan (RELPAX) 40 MG tablet, TAKE 1 TABLET BY MOUTH AT ONSET OF MIGRAINE, MAY REPEAT ONCE IN 2 HOURS IF HEADACHE PERSISTS OR RECURS. MAX DOSE 80 MG/24 HRS. Marland Kitchen  Ibuprofen (ADVIL MIGRAINE) 200 MG CAPS, Take by mouth daily as needed. .  SUMAtriptan (IMITREX) 100 MG tablet, Take 1 tablet (100 mg total) by mouth once as needed for migraine. May repeat in 2 hours if headache persists or recurs.  Current Outpatient Medications (Hematological):  .  cyanocobalamin 500 MCG tablet, Take 400 mcg by mouth daily.  Current Outpatient Medications (Other):  .  amoxicillin-clavulanate (AUGMENTIN) 875-125 MG tablet, Take 1 tablet 2 (two)  times daily by mouth. Marland Kitchen  amoxicillin-clavulanate (AUGMENTIN) 875-125 MG tablet, Take 1 tablet by mouth 2 (two) times daily. Marland Kitchen  BOTOX 200 units SOLR, BRING TO OFFICE VISIT .  cephALEXin (KEFLEX) 500 MG capsule, Take 1 capsule (500 mg total) by mouth 2 (two) times daily. .  cholecalciferol (VITAMIN D) 1000 units tablet, Take 1,000 Units by mouth daily. .  ciprofloxacin (CIPRO) 500 MG tablet, Take 1 tablet (500 mg total) by mouth 2 (two) times daily. Marland Kitchen  co-enzyme Q-10 30 MG capsule, Take 100 mg by mouth 3 (three) times daily. .  Diclofenac Sodium (PENNSAID) 2 % SOLN, Place 2 application onto the skin 2 (two) times daily. .  magnesium gluconate (MAGONATE) 500 MG tablet, Take 500 mg by mouth 2 (two) times daily. .  metroNIDAZOLE (FLAGYL) 500 MG tablet, Take 1 tablet (500 mg total) by mouth 2 (two) times daily. .  Multiple Vitamin (MULTIVITAMIN WITH MINERALS) TABS tablet, Take 1 tablet by mouth daily. .  nortriptyline (PAMELOR) 50 MG capsule, Take 1 capsule (50 mg total) by mouth at bedtime. .  nortriptyline (PAMELOR) 50 MG capsule, TAKE 2 CAPSULES BY MOUTH AT BEDTIME .  Riboflavin (VITAMIN B-2 PO), Take 1 capsule by mouth. Marland Kitchen  Specialty Vitamins Products (MAGNESIUM, AMINO ACID CHELATE,) 133 MG tablet, Take 1 tablet by mouth 2 (two) times daily. Marland Kitchen  tiZANidine (ZANAFLEX) 4 MG tablet, TAKE 1/2 TABLET BY MOUTH AT BEDTIME AS NEEDED FOR MUSCLE SPASMS. .  Turmeric 500 MG CAPS, Take 500 mg by mouth 2 (two) times daily. .  Vitamin D, Ergocalciferol, (DRISDOL) 50000 units CAPS capsule, Take 1 capsule (50,000 Units total) by mouth every 7 (seven) days.    Past medical history, social, surgical and family history all reviewed in electronic medical record.  No pertanent information unless stated regarding to the chief complaint.   Review of Systems:  No , visual changes, nausea, vomiting, diarrhea, constipation, dizziness, abdominal pain, skin rash, fevers, chills, night sweats, weight loss, swollen lymph  nodes, body aches, joint swelling, , chest pain, shortness of breath, mood changes.  Positive muscle aches, headaches  Objective  Blood pressure (!) 158/90, pulse 97, height 5\' 7"  (1.702 m), weight 189 lb (85.7 kg), SpO2 93 %.    General: No apparent distress alert and oriented x3 mood and affect normal, dressed appropriately.  HEENT: Pupils equal, extraocular movements intact  Respiratory: Patient's speak in full sentences and does not appear short of breath  Cardiovascular: No lower extremity edema, non tender, no erythema  Skin: Warm dry intact with no signs of infection or rash on extremities or on axial skeleton.  Abdomen: Soft nontender  Neuro: Cranial nerves II through XII are intact, neurovascularly intact in all extremities with 2+ DTRs and 2+ pulses.  Lymph: No lymphadenopathy  of posterior or anterior cervical chain or axillae bilaterally.  Gait normal with good balance and coordination.  MSK:  Non tender with full range of motion and good stability and symmetric strength and tone of shoulders, elbows, wrist, hip, knee and ankles bilaterally.  Neck exam does have some loss of lordosis.  Tightness of the trapezius bilaterally.  Does have some limited range of motion lacking last 5 degrees of flexion in the last 5 degrees of extension.  Tightness with right-sided rotation.  Osteopathic findings C2 flexed rotated and side bent right C4 flexed rotated and side bent left C6 flexed rotated and side bent left T3 extended rotated and side bent right inhaled third rib T9 extended rotated and side bent left L2 flexed rotated and side bent right Sacrum right on right    Impression and Recommendations:     This case required medical decision making of moderate complexity. The above documentation has been reviewed and is accurate and complete Mallory Pulley, DO       Note: This dictation was prepared with Dragon dictation along with smaller phrase technology. Any transcriptional  errors that result from this process are unintentional.

## 2018-09-09 MED FILL — NORTRIPTYLINE HCL 50 MG CAP: 50 | 30 days supply | Qty: 60 | Fill #3

## 2018-09-26 MED FILL — ELETRIPTAN HYDROBROMIDE 40: 40 | 30 days supply | Qty: 24 | Fill #1

## 2018-10-04 NOTE — Progress Notes (Signed)
[name] [address], [place] [address] Phone: [phone] Subjective:     CC: Clinic follow-up  XKG:[id number]  [name] is a 53 y.o. female coming in with complaint of back pain. No change in her pain since last visit. Is here for OMT today to manage her back pain.  Patient is doing much better overall.  Some mild headache still noted. No radiation of the pain.  Tightness in the shoulder blades.  Try to work on posture.     Past Medical History:  Diagnosis Date  . History of ectopic pregnancy    04/ 2007  S/P LEFT SALPINGECTOMY  . History of kidney stones   . Hypothyroidism   . Menorrhagia   . Migraine    Past Surgical History:  Procedure Laterality Date  . CARPAL TUNNEL RELEASE Left 12/05/2017   Procedure: OPEN REDUCTION INTERNAL FIXATION (ORIF) WRIST FRACTURE WITH CARPAL TUNNEL RELEASE;  Surgeon: Roseanne Kaufman, MD;  Location: [facility];  Service: Orthopedics;  Laterality: Left;  . DILATATION & CURETTAGE/HYSTEROSCOPY WITH MYOSURE  2016  . DILATATION & CURETTAGE/HYSTEROSCOPY WITH MYOSURE N/A 10/23/2016   Procedure: DILATATION & CURETTAGE/HYSTEROSCOPY;  Surgeon: Everlene Farrier, MD;  Location: [facility];  Service: Gynecology;  Laterality: N/A;  . DILATION AND EVACUATION  10/07/2007   retained poc  . LAPAROSCOPIC UNILATERAL SALPINGECTOMY Left 11/24/2005   Social History   Socioeconomic History  . Marital status: Widowed    Spouse name: Not on file  . Number of children: 0  . Years of education: 28  . Highest education level: Not on file  Occupational History  . Occupation: [employer]  Social Needs  . Financial resource strain: Not on file  . Food insecurity:    Worry: Not on file    Inability: Not on file  . Transportation needs:    Medical: Not on file    Non-medical: Not on file  Tobacco Use  . Smoking status: Never Smoker  . Smokeless tobacco: Never Used  Substance and Sexual  Activity  . Alcohol use: No  . Drug use: No  . Sexual activity: Not on file  Lifestyle  . Physical activity:    Days per week: Not on file    Minutes per session: Not on file  . Stress: Not on file  Relationships  . Social connections:    Talks on phone: Not on file    Gets together: Not on file    Attends religious service: Not on file    Active member of club or organization: Not on file    Attends meetings of clubs or organizations: Not on file    Relationship status: Not on file  Other Topics Concern  . Not on file  Social History Narrative   Lives at home alone   Right-handed   Caffeine: weekly   No Known Allergies Family History  Problem Relation Age of Onset  . Hypertension Mother   . Aortic aneurysm Mother   . Liver cancer Father   . Prostate cancer Brother   . Migraines Sister     Current Outpatient Medications (Endocrine & Metabolic):  .  levothyroxine (SYNTHROID, LEVOTHROID) 88 MCG tablet, Take 88 mcg by mouth daily before breakfast.    Current Outpatient Medications (Analgesics):  .  eletriptan (RELPAX) 40 MG tablet, TAKE 1 TABLET BY MOUTH AT ONSET OF MIGRAINE, MAY REPEAT ONCE IN 2 HOURS IF HEADACHE PERSISTS OR RECURS. MAX DOSE 80 MG/24 HRS. Marland Kitchen  Ibuprofen (ADVIL MIGRAINE) 200 MG CAPS, Take by mouth daily as needed. .  SUMAtriptan (IMITREX) 100 MG tablet, Take 1 tablet (100 mg total) by mouth once as needed for migraine. May repeat in 2 hours if headache persists or recurs.  Current Outpatient Medications (Hematological):  .  cyanocobalamin 500 MCG tablet, Take 400 mcg by mouth daily.  Current Outpatient Medications (Other):  .  amoxicillin-clavulanate (AUGMENTIN) 875-125 MG tablet, Take 1 tablet 2 (two) times daily by mouth. Marland Kitchen  amoxicillin-clavulanate (AUGMENTIN) 875-125 MG tablet, Take 1 tablet by mouth 2 (two) times daily. Marland Kitchen  BOTOX 200 units SOLR, BRING TO OFFICE VISIT .  cephALEXin (KEFLEX) 500 MG capsule, Take 1 capsule (500 mg total) by mouth 2 (two)  times daily. .  cholecalciferol (VITAMIN D) 1000 units tablet, Take 1,000 Units by mouth daily. .  ciprofloxacin (CIPRO) 500 MG tablet, Take 1 tablet (500 mg total) by mouth 2 (two) times daily. Marland Kitchen  co-enzyme Q-10 30 MG capsule, Take 100 mg by mouth 3 (three) times daily. .  Diclofenac Sodium (PENNSAID) 2 % SOLN, Place 2 application onto the skin 2 (two) times daily. .  magnesium gluconate (MAGONATE) 500 MG tablet, Take 500 mg by mouth 2 (two) times daily. .  metroNIDAZOLE (FLAGYL) 500 MG tablet, Take 1 tablet (500 mg total) by mouth 2 (two) times daily. .  Multiple Vitamin (MULTIVITAMIN WITH MINERALS) TABS tablet, Take 1 tablet by mouth daily. .  nortriptyline (PAMELOR) 50 MG capsule, Take 1 capsule (50 mg total) by mouth at bedtime. .  nortriptyline (PAMELOR) 50 MG capsule, TAKE 2 CAPSULES BY MOUTH AT BEDTIME .  Riboflavin (VITAMIN B-2 PO), Take 1 capsule by mouth. Marland Kitchen  Specialty Vitamins Products (MAGNESIUM, AMINO ACID CHELATE,) 133 MG tablet, Take 1 tablet by mouth 2 (two) times daily. Marland Kitchen  tiZANidine (ZANAFLEX) 4 MG tablet, TAKE 1/2 TABLET BY MOUTH AT BEDTIME AS NEEDED FOR MUSCLE SPASMS. .  Turmeric 500 MG CAPS, Take 500 mg by mouth 2 (two) times daily. .  Vitamin D, Ergocalciferol, (DRISDOL) 50000 units CAPS capsule, Take 1 capsule (50,000 Units total) by mouth every 7 (seven) days.    Past medical history, social, surgical and family history all reviewed in electronic medical record.  No pertanent information unless stated regarding to the chief complaint.   Review of Systems:  No headache, visual changes, nausea, vomiting, diarrhea, constipation, dizziness, abdominal pain, skin rash, fevers, chills, night sweats, weight loss, swollen lymph nodes, body aches, joint swelling, chest pain, shortness of breath, mood changes.  Positive muscle aches  Objective  Blood pressure 110/82, pulse (!) 112, height 5\' 7"  (1.702 m), weight 188 lb (85.3 kg), SpO2 99 %.    General: No apparent distress  alert and oriented x3 mood and affect normal, dressed appropriately.  HEENT: Pupils equal, extraocular movements intact  Respiratory: Patient's speak in full sentences and does not appear short of breath  Cardiovascular: No lower extremity edema, non tender, no erythema  Skin: Warm dry intact with no signs of infection or rash on extremities or on axial skeleton.  Abdomen: Soft nontender  Neuro: Cranial nerves II through XII are intact, neurovascularly intact in all extremities with 2+ DTRs and 2+ pulses.  Lymph: No lymphadenopathy of posterior or anterior cervical chain or axillae bilaterally.  Gait normal with good balance and coordination.  MSK:  Non tender with full range of motion and good stability and symmetric strength and tone of shoulders, elbows, wrist, hip, knee and ankles bilaterally.  Neck: Inspection  unremarkable. No palpable stepoffs. Negative Spurling's maneuver. No significant range of motion with sidebending bilaterally and lacks the last 10 degrees of extension Grip strength and sensation normal in bilateral hands Strength good C4 to T1 distribution No sensory change to C4 to T1 Negative Hoffman sign bilaterally Reflexes normal Tightness of the right trapezius.   Osteopathic findings C2 flexed rotated and side bent right C4 flexed rotated and side bent left C7 flexed rotated and side bent left T3 extended rotated and side bent right inhaled third rib T9 extended rotated and side bent left L2 flexed rotated and side bent right Sacrum right on right Impression and Recommendations:     This case required medical decision making of moderate complexity. The above documentation has been reviewed and is accurate and complete [name], DO       Note: This dictation was prepared with Dragon dictation along with small  er phrase technology. Any transcriptional errors that result from this process are unintentional.

## 2018-10-05 ENCOUNTER — Encounter: Payer: Self-pay | Admitting: Family Medicine

## 2018-10-05 ENCOUNTER — Ambulatory Visit: Payer: 59 | Admitting: Family Medicine

## 2018-10-05 VITALS — BP 110/82 | HR 112 | Ht 67.0 in | Wt 188.0 lb

## 2018-10-05 DIAGNOSIS — M999 Biomechanical lesion, unspecified: Secondary | ICD-10-CM | POA: Diagnosis not present

## 2018-10-05 DIAGNOSIS — G4486 Cervicogenic headache: Secondary | ICD-10-CM

## 2018-10-05 DIAGNOSIS — R51 Headache: Secondary | ICD-10-CM | POA: Diagnosis not present

## 2018-10-05 NOTE — Assessment & Plan Note (Signed)
Responds fairly well to osteopathic manipulation.  No significant change in medication and continue the other ones at this moment.  Patient will continue with vitamin supplementation.  Discussed continuing on the posture and other ergonomics that she can make changes to the pain in her work environment.  Follow-up again in 6-8 weeks

## 2018-10-05 NOTE — Assessment & Plan Note (Signed)
Decision today to treat with OMT was based on Physical Exam  After verbal consent patient was treated with HVLA, ME, FPR techniques in cervical, thoracic, rib, lumbar and sacral areas  Patient tolerated the procedure well with improvement in symptoms  Patient given exercises, stretches and lifestyle modifications  See medications in patient instructions if given  Patient will follow up in 4 weeks 

## 2018-10-05 NOTE — Patient Instructions (Signed)
Good ot see you  You are making progress Love the idea of your monitor at eye level Keep bringing the shoulders back  See me again in 6-7 weeks

## 2018-10-08 MED FILL — NORTRIPTYLINE HCL 50 MG CAP: 50 | 30 days supply | Qty: 60 | Fill #4
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
## 2018-10-15 ENCOUNTER — Encounter: Payer: Self-pay | Admitting: Neurology

## 2018-10-15 ENCOUNTER — Other Ambulatory Visit: Payer: Self-pay

## 2018-10-15 ENCOUNTER — Ambulatory Visit (INDEPENDENT_AMBULATORY_CARE_PROVIDER_SITE_OTHER): Payer: 59 | Admitting: Neurology

## 2018-10-15 VITALS — BP 132/70 | HR 91 | Ht 67.0 in | Wt 188.0 lb

## 2018-10-15 DIAGNOSIS — G43709 Chronic migraine without aura, not intractable, without status migrainosus: Secondary | ICD-10-CM | POA: Diagnosis not present

## 2018-10-15 MED ORDER — NORTRIPTYLINE HCL 25 MG PO CAPS: 25.0000 mg | ORAL_CAPSULE | Freq: Every day | ORAL | 0 refills | Status: DC

## 2018-10-15 MED ORDER — ERENUMAB-AOOE 70 MG/ML ~~LOC~~ SOAJ: 1.0000 | SUBCUTANEOUS | 3 refills | Status: DC

## 2018-10-15 MED FILL — NORTRIPTYLINE HCL CAP 25 MG: 25 | 7 days supply | Qty: 7 | Fill #0

## 2018-10-15 NOTE — Patient Instructions (Signed)
1.  STOP BOTOX.  For preventative management, we will start Aimovig 70mg  monthly 2.  For abortive therapy, Relpax.  May repeat dose once after 2 hours if needed (maximum 2 tablets in 24 hours) 3. Taper off of nortriptyline.  Take 50mg  at bedtime for one week, then 25mg  at bedtime for one week, then STOP 4.  Limit use of pain relievers to no more than 2 days out of week to prevent risk of rebound or medication-overuse headache. 5.  Keep headache diary 6.  Exercise, hydration, caffeine cessation, sleep hygiene, monitor for and avoid triggers 7.  Consider:  magnesium citrate 400mg  daily, riboflavin 400mg  daily, and coenzyme Q10 100mg  three times daily 8.  Follow up in 4 months

## 2018-10-18 ENCOUNTER — Telehealth: Payer: Self-pay | Admitting: Neurology

## 2018-10-18 NOTE — Telephone Encounter (Signed)
Patient wants to know if she can start the [place] as soon as she gets it

## 2018-10-18 NOTE — Telephone Encounter (Signed)
Called and LMOVM advising Pt she may inject Aimovig when she picks it up

## 2018-10-19 ENCOUNTER — Telehealth: Payer: Self-pay | Admitting: Neurology

## 2018-10-19 NOTE — Telephone Encounter (Signed)
Patient needs prior auth for the amivig and we should have gotten something on Friday afternoon she is suppose to start this medication soon and needs this taken care of please call to let her know the status of the prior auth

## 2018-10-20 MED FILL — ELETRIPTAN HYDROBROMIDE 40: 40 | 30 days supply | Qty: 24 | Fill #2

## 2018-10-20 NOTE — Telephone Encounter (Signed)
Called and LMOVM advising Pt we have not rcvd a determination, but she can go to [email or web address] and register until we do, and she will receive Aimovig for no more than $5

## 2018-10-20 NOTE — Telephone Encounter (Signed)
Patient called regarding her Prior Auth for her medication. She said she has a few Rx to pick up at the pharmacy and wanted to pick them all up at the same time. Her cell number is [phone]. She will be available at 1:00 PM. Thanks

## 2018-10-21 ENCOUNTER — Telehealth: Payer: Self-pay | Admitting: Neurology

## 2018-10-21 MED FILL — AIMOVIG 70 MG/ML SOAJ: 70 | 28 days supply | Qty: 1 | Fill #0

## 2018-10-21 NOTE — Telephone Encounter (Signed)
Called and spoke with Pt. I advised her we are waiting on a determination, but if she would like to pick up her Rx today, she may log onto [email or web address] and get the medication for free.

## 2018-10-21 NOTE — Telephone Encounter (Signed)
Patient wants someone to call her about the PA that was needed for her medication. Thanks!

## 2018-10-27 ENCOUNTER — Telehealth: Payer: 59 | Admitting: Family

## 2018-10-27 DIAGNOSIS — B373 Candidiasis of vulva and vagina: Secondary | ICD-10-CM | POA: Diagnosis not present

## 2018-10-27 DIAGNOSIS — B3731 Acute candidiasis of vulva and vagina: Secondary | ICD-10-CM

## 2018-10-27 MED ORDER — FLUCONAZOLE 150 MG PO TABS: 150.0000 mg | ORAL_TABLET | ORAL | 0 refills | Status: DC | PRN

## 2018-10-27 MED FILL — FLUCONAZOLE 150 MG TABS: 150 | 9 days supply | Qty: 3 | Fill #0

## 2018-10-27 NOTE — Progress Notes (Signed)
We are sorry that you are not feeling well. Here is how we plan to help! Based on what you shared with me it looks like you: May have a yeast vaginosis.  Approximately 5 minutes was spent documenting and reviewing patient's chart.    Vaginosis is an inflammation of the vagina that can result in discharge, itching and pain. The cause is usually a change in the normal balance of vaginal bacteria or an infection. Vaginosis can also result from reduced estrogen levels after menopause.  The most common causes of vaginosis are:   Bacterial vaginosis which results from an overgrowth of one on several organisms that are normally present in your vagina.   Yeast infections which are caused by a naturally occurring fungus called candida.   Vaginal atrophy (atrophic vaginosis) which results from the thinning of the vagina from reduced estrogen levels after menopause.   Trichomoniasis which is caused by a parasite and is commonly transmitted by sexual intercourse.  Factors that increase your risk of developing vaginosis include: . Medications, such as antibiotics and steroids . Uncontrolled diabetes . Use of hygiene products such as bubble bath, vaginal spray or vaginal deodorant . Douching . Wearing damp or tight-fitting clothing . Using an intrauterine device (IUD) for birth control . Hormonal changes, such as those associated with pregnancy, birth control pills or menopause . Sexual activity . Having a sexually transmitted infection  Your treatment plan is A single Diflucan (fluconazole) 150mg tablet once.  I have electronically sent this prescription into the pharmacy that you have chosen.  Be sure to take all of the medication as directed. Stop taking any medication if you develop a rash, tongue swelling or shortness of breath. Mothers who are breast feeding should consider pumping and discarding their breast milk while on these antibiotics. However, there is no consensus that infant exposure  at these doses would be harmful.  Remember that medication creams can weaken latex condoms. .   HOME CARE:  Good hygiene may prevent some types of vaginosis from recurring and may relieve some symptoms:  . Avoid baths, hot tubs and whirlpool spas. Rinse soap from your outer genital area after a shower, and dry the area well to prevent irritation. Don't use scented or harsh soaps, such as those with deodorant or antibacterial action. . Avoid irritants. These include scented tampons and pads. . Wipe from front to back after using the toilet. Doing so avoids spreading fecal bacteria to your vagina.  Other things that may help prevent vaginosis include:  . Don't douche. Your vagina doesn't require cleansing other than normal bathing. Repetitive douching disrupts the normal organisms that reside in the vagina and can actually increase your risk of vaginal infection. Douching won't clear up a vaginal infection. . Use a latex condom. Both female and female latex condoms may help you avoid infections spread by sexual contact. . Wear cotton underwear. Also wear pantyhose with a cotton crotch. If you feel comfortable without it, skip wearing underwear to bed. Yeast thrives in moist environments Your symptoms should improve in the next day or two.  GET HELP RIGHT AWAY IF:  . You have pain in your lower abdomen ( pelvic area or over your ovaries) . You develop nausea or vomiting . You develop a fever . Your discharge changes or worsens . You have persistent pain with intercourse . You develop shortness of breath, a rapid pulse, or you faint.  These symptoms could be signs of problems or infections that need to   be evaluated by a medical provider now.  MAKE SURE YOU    Understand these instructions.  Will watch your condition.  Will get help right away if you are not doing well or get worse.  Your e-visit answers were reviewed by a board certified advanced clinical practitioner to complete  your personal care plan. Depending upon the condition, your plan could have included both over the counter or prescription medications. Please review your pharmacy choice to make sure that you have choses a pharmacy that is open for you to pick up any needed prescription, Your safety is important to us. If you have drug allergies check your prescription carefully.   You can use MyChart to ask questions about today's visit, request a non-urgent call back, or ask for a work or school excuse for 24 hours related to this e-Visit. If it has been greater than 24 hours you will need to follow up with your provider, or enter a new e-Visit to address those concerns. You will get a MyChart message within the next two days asking about your experience. I hope that your e-visit has been valuable and will speed your recovery.  

## 2018-11-05 MED FILL — LEVOTHYROXINE 88 MCG TABLET: 88 | 90 days supply | Qty: 90 | Fill #3

## 2018-11-10 ENCOUNTER — Ambulatory Visit: Payer: 59 | Admitting: Family Medicine

## 2018-11-12 ENCOUNTER — Ambulatory Visit: Payer: 59 | Admitting: Neurology

## 2018-11-12 MED FILL — AIMOVIG 70 MG/ML SOAJ: 70 | 28 days supply | Qty: 1 | Fill #1

## 2018-11-12 MED FILL — ELETRIPTAN HBR 40 MG TABLET: 40 | 30 days supply | Qty: 24 | Fill #3

## 2018-11-30 ENCOUNTER — Telehealth: Payer: Self-pay | Admitting: Neurology

## 2018-11-30 MED ORDER — ERENUMAB-AOOE 140 MG/ML ~~LOC~~ SOAJ: 140.0000 mg | SUBCUTANEOUS | 11 refills | Status: DC

## 2018-11-30 NOTE — Telephone Encounter (Signed)
Pt was contacted via My Chart when the provider gave instructions

## 2018-11-30 NOTE — Telephone Encounter (Signed)
Patient sent a My Chart message and states no one got back to her, she is wanting to know the amovig 70 mg is not working at all.she has taken two rounds and still have headaches She would like to know if we can increase it. She uses the [facility]  Also  She is not sleeping and would like to know if he can help with that

## 2018-12-02 ENCOUNTER — Other Ambulatory Visit: Payer: Self-pay

## 2018-12-02 MED ORDER — ELETRIPTAN HYDROBROMIDE 40 MG PO TABS: 40.0000 mg | ORAL_TABLET | ORAL | 3 refills | Status: DC | PRN

## 2018-12-09 MED FILL — ELETRIPTAN HBR 40 MG TABLET: 40 | 30 days supply | Qty: 24 | Fill #0

## 2018-12-10 NOTE — Progress Notes (Signed)
Initiated on Cover My Meds [employer] (Key: [id number])   [employer] is reviewing your PA request. You may close this dialog, return to your dashboard, and perform other tasks. To check for an update later, open this request again from your dashboard. If [employer] has not replied within 24 hours for urgent requests or within 48 hours for standard requests, please contact [employer] at [phone].

## 2018-12-13 MED FILL — AIMOVIG 140 MG/ML SOAJ: 140 | 28 days supply | Qty: 1 | Fill #0

## 2018-12-13 NOTE — Progress Notes (Signed)
The request has been approved. The authorization is effective for a maximum of 6 fills from 12/09/2018 to 06/09/2019

## 2019-01-04 MED FILL — AIMOVIG 140 MG/ML SOAJ: 140 | 28 days supply | Qty: 1 | Fill #1

## 2019-01-04 MED FILL — ELETRIPTAN HBR 40 MG TABLET: 40 | 30 days supply | Qty: 24 | Fill #1

## 2019-01-14 DIAGNOSIS — E039 Hypothyroidism, unspecified: Secondary | ICD-10-CM | POA: Diagnosis not present

## 2019-01-14 DIAGNOSIS — E7800 Pure hypercholesterolemia, unspecified: Secondary | ICD-10-CM | POA: Diagnosis not present

## 2019-01-14 DIAGNOSIS — Z0000 Encounter for general adult medical examination without abnormal findings: Secondary | ICD-10-CM | POA: Diagnosis not present

## 2019-01-20 DIAGNOSIS — K589 Irritable bowel syndrome without diarrhea: Secondary | ICD-10-CM | POA: Diagnosis not present

## 2019-01-20 DIAGNOSIS — G479 Sleep disorder, unspecified: Secondary | ICD-10-CM | POA: Diagnosis not present

## 2019-01-20 DIAGNOSIS — E7800 Pure hypercholesterolemia, unspecified: Secondary | ICD-10-CM | POA: Diagnosis not present

## 2019-01-20 DIAGNOSIS — Z0000 Encounter for general adult medical examination without abnormal findings: Secondary | ICD-10-CM | POA: Diagnosis not present

## 2019-01-20 DIAGNOSIS — E039 Hypothyroidism, unspecified: Secondary | ICD-10-CM | POA: Diagnosis not present

## 2019-01-20 DIAGNOSIS — Z1211 Encounter for screening for malignant neoplasm of colon: Secondary | ICD-10-CM | POA: Diagnosis not present

## 2019-01-26 MED FILL — LEVOTHYROXINE 100 MCG TAB: 100 | 90 days supply | Qty: 90 | Fill #0

## 2019-02-07 MED FILL — AIMOVIG 140 MG/ML SOAJ: 140 | 28 days supply | Qty: 1 | Fill #2

## 2019-02-11 MED FILL — ELETRIPTAN HBR 40 MG TABLET: 40 | 30 days supply | Qty: 24 | Fill #2
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
## 2019-02-16 ENCOUNTER — Other Ambulatory Visit: Payer: Self-pay

## 2019-02-16 ENCOUNTER — Encounter: Payer: Self-pay | Admitting: Neurology

## 2019-02-16 ENCOUNTER — Ambulatory Visit (INDEPENDENT_AMBULATORY_CARE_PROVIDER_SITE_OTHER): Payer: 59 | Admitting: Neurology

## 2019-02-16 VITALS — BP 127/87 | HR 79 | Temp 98.4°F | Ht 67.0 in | Wt 184.0 lb

## 2019-02-16 DIAGNOSIS — G43009 Migraine without aura, not intractable, without status migrainosus: Secondary | ICD-10-CM | POA: Diagnosis not present

## 2019-02-16 MED ORDER — TROKENDI XR 25 MG PO CP24: 25.0000 mg | ORAL_CAPSULE | Freq: Every day | ORAL | 3 refills | Status: DC

## 2019-02-16 MED FILL — TROKENDI XR 25 MG CAPSULE: 25 | 30 days supply | Qty: 30 | Fill #0

## 2019-02-16 NOTE — Patient Instructions (Addendum)
1.  We will add Trokendi XR 25mg  at bedtime in addition to the [place].  If headaches not improved in 4 weeks, contact us and we can increase dose. 2.  Follow up in 5 months

## 2019-02-28 NOTE — Progress Notes (Signed)
[name] [address] [place], [place] [address] Phone: [phone] Subjective:   I [name] am serving as a Education administrator for Dr. Hulan Saas.  I'm seeing this patient by the request  of:    CC: Neck pain and headache follow-up  BJY:[id number]  MARBELLA MARKGRAF is a 53 y.o. female coming in with complaint of back pain. States she has pain in her neck and shoulders.  Patient has seen multiple times before.  Patient has not been seen for some time secondary to the [place] outbreak.  Feels her movements cause some increasing tightness in the neck.  States it is fairly severe.  Rates the severity of pain is 9 out of 10.  States that even light sensation touch causes more pain.     Past Medical History:  Diagnosis Date  . History of ectopic pregnancy    04/ 2007  S/P LEFT SALPINGECTOMY  . History of kidney stones   . Hypothyroidism   . Menorrhagia   . Migraine    Past Surgical History:  Procedure Laterality Date  . CARPAL TUNNEL RELEASE Left 12/05/2017   Procedure: OPEN REDUCTION INTERNAL FIXATION (ORIF) WRIST FRACTURE WITH CARPAL TUNNEL RELEASE;  Surgeon: Roseanne Kaufman, MD;  Location: [place];  Service: Orthopedics;  Laterality: Left;  . DILATATION & CURETTAGE/HYSTEROSCOPY WITH MYOSURE  2016  . DILATATION & CURETTAGE/HYSTEROSCOPY WITH MYOSURE N/A 10/23/2016   Procedure: DILATATION & CURETTAGE/HYSTEROSCOPY;  Surgeon: Everlene Farrier, MD;  Location: [place];  Service: Gynecology;  Laterality: N/A;  . DILATION AND EVACUATION  10/07/2007   retained poc  . LAPAROSCOPIC UNILATERAL SALPINGECTOMY Left 11/24/2005   Social History   Socioeconomic History  . Marital status: Widowed    Spouse name: Not on file  . Number of children: 0  . Years of education: 34  . Highest education level: Not on file  Occupational History  . Occupation: [employer]  Social Needs  . Financial resource strain: Not on file  . Food insecurity     Worry: Not on file    Inability: Not on file  . Transportation needs    Medical: Not on file    Non-medical: Not on file  Tobacco Use  . Smoking status: Never Smoker  . Smokeless tobacco: Never Used  Substance and Sexual Activity  . Alcohol use: No  . Drug use: No  . Sexual activity: Not on file  Lifestyle  . Physical activity    Days per week: Not on file    Minutes per session: Not on file  . Stress: Not on file  Relationships  . Social Herbalist on phone: Not on file    Gets together: Not on file    Attends religious service: Not on file    Active member of club or organization: Not on file    Attends meetings of clubs or organizations: Not on file    Relationship status: Not on file  Other Topics Concern  . Not on file  Social History Narrative   Lives at home alone   Right-handed   Caffeine: weekly   No Known Allergies Family History  Problem Relation Age of Onset  . Hypertension Mother   . Aortic aneurysm Mother   . Liver cancer Father   . Prostate cancer Brother   . Migraines Sister     Current Outpatient Medications (Endocrine & Metabolic):  .  levothyroxine (SYNTHROID) 100 MCG tablet, Take 100 mcg  by mouth daily before breakfast.    Current Outpatient Medications (Analgesics):  .  eletriptan (RELPAX) 40 MG tablet, Take 1 tablet (40 mg total) by mouth every 2 (two) hours as needed for migraine or headache. May repeat in 2 hrs PRN, 2 tabs in 24 hr max .  Erenumab-aooe (AIMOVIG) 140 MG/ML SOAJ, Inject 140 mg into the skin every 30 (thirty) days. .  Ibuprofen (ADVIL MIGRAINE) 200 MG CAPS, Take by mouth daily as needed.  Current Outpatient Medications (Hematological):  .  cyanocobalamin 500 MCG tablet, Take 400 mcg by mouth daily.  Current Outpatient Medications (Other):  .  cholecalciferol (VITAMIN D) 1000 units tablet, Take 1,000 Units by mouth daily. Marland Kitchen  co-enzyme Q-10 30 MG capsule, Take 100 mg by mouth 3 (three) times daily. .   Diclofenac Sodium (PENNSAID) 2 % SOLN, Place 2 application onto the skin 2 (two) times daily. .  magnesium gluconate (MAGONATE) 500 MG tablet, Take 500 mg by mouth 2 (two) times daily. .  Multiple Vitamin (MULTIVITAMIN WITH MINERALS) TABS tablet, Take 1 tablet by mouth daily. .  Riboflavin (VITAMIN B-2 PO), Take 1 capsule by mouth. Marland Kitchen  Specialty Vitamins Products (MAGNESIUM, AMINO ACID CHELATE,) 133 MG tablet, Take 1 tablet by mouth 2 (two) times daily. .  Topiramate ER (TROKENDI XR) 25 MG CP24, Take 1 capsule (25 mg total) by mouth at bedtime. .  Turmeric 500 MG CAPS, Take 500 mg by mouth 2 (two) times daily. .  Vitamin D, Ergocalciferol, (DRISDOL) 50000 units CAPS capsule, Take 1 capsule (50,000 Units total) by mouth every 7 (seven) days.    Past medical history, social, surgical and family history all reviewed in electronic medical record.  No pertanent information unless stated regarding to the chief complaint.   Review of Systems:  No visual changes, nausea, vomiting, diarrhea, constipation, dizziness, abdominal pain, skin rash, fevers, chills, night sweats, weight loss, swollen lymph nodes, body aches, joint swelling,  chest pain, shortness of breath, mood changes.  Positive muscle aches, headaches  Objective  Blood pressure 130/82, pulse 84, height 5\' 7"  (1.702 m), weight 185 lb (83.9 kg), SpO2 97 %.   General: No apparent distress alert and oriented x3 mood and affect normal, dressed appropriately.  HEENT: Pupils equal, extraocular movements intact  Respiratory: Patient's speak in full sentences and does not appear short of breath  Cardiovascular: No lower extremity edema, non tender, no erythema  Skin: Warm dry intact with no signs of infection or rash on extremities or on axial skeleton.  Abdomen: Soft nontender  Neuro: Cranial nerves II through XII are intact, neurovascularly intact in all extremities with 2+ DTRs and 2+ pulses.  Lymph: No lymphadenopathy of posterior or anterior  cervical chain or axillae bilaterally.  Gait normal with good balance and coordination.  MSK:  Non tender with full range of motion and good stability and symmetric strength and tone of shoulders, elbows, wrist, hip, knee and ankles bilaterally.  Neck: Inspection loss of lordosis. No palpable stepoffs. Negative Spurling's maneuver. Patient does have some mild midline range of motion in the neck and lacks 5 degrees of extension and lacks 5 degrees of flexion Grip strength and sensation normal in bilateral hands Strength good C4 to T1 distribution No sensory change to C4 to T1 Negative Hoffman sign bilaterally Reflexes normal Tightness of the trapezius on the right.  Osteopathic findings C4 flexed rotated and side bent left C7 flexed rotated and side bent left T8 extended rotated and side bent left L2  flexed rotated and side bent right      Impression and Recommendations:     This case required medical decision making of moderate complexity. The above documentation has been reviewed and is accurate and complete [name], DO       Note: This dictation was prepared with Dragon dictation along with smaller phrase technology. Any transcriptional errors that result from this process are unintentional.

## 2019-03-01 ENCOUNTER — Encounter: Payer: Self-pay | Admitting: Family Medicine

## 2019-03-01 ENCOUNTER — Other Ambulatory Visit: Payer: Self-pay

## 2019-03-01 ENCOUNTER — Ambulatory Visit: Payer: 59 | Admitting: Family Medicine

## 2019-03-01 VITALS — BP 130/82 | HR 84 | Ht 67.0 in | Wt 185.0 lb

## 2019-03-01 DIAGNOSIS — R51 Headache: Secondary | ICD-10-CM

## 2019-03-01 DIAGNOSIS — M999 Biomechanical lesion, unspecified: Secondary | ICD-10-CM

## 2019-03-01 DIAGNOSIS — G4486 Cervicogenic headache: Secondary | ICD-10-CM

## 2019-03-01 NOTE — Assessment & Plan Note (Signed)
Continues to have some cervicogenic headaches.  Does respond fairly well to osteopathic manipulation.  Has been nearly 3 months since we have seen injection.  We discussed icing regimen, core stability, posture and ergonomics.  Follow-up again in 5 to 6 weeks.  Continue to have difficulty Future considerations secondary to more of a and headaches would be sleep study.

## 2019-03-01 NOTE — Assessment & Plan Note (Signed)
Decision today to treat with OMT was based on Physical Exam  After verbal consent patient was treated with HVLA, ME, FPR techniques in cervical, thoracic, lumbar and sacral areas  Patient tolerated the procedure well with improvement in symptoms  Patient given exercises, stretches and lifestyle modifications  See medications in patient instructions if given  Patient will follow up in 3-6 weeks

## 2019-03-01 NOTE — Patient Instructions (Signed)
Keep up the exercises Work on posture  Will consider sleep study in the future See me again in 5-6 weeks

## 2019-03-07 MED FILL — AIMOVIG 140 MG/ML SOAJ: 140 | 28 days supply | Qty: 1 | Fill #3

## 2019-03-08 MED FILL — ELETRIPTAN HBR 40 MG TABLET: 40 | 30 days supply | Qty: 24 | Fill #3

## 2019-03-15 MED FILL — TROKENDI XR 25 MG CAPSULE: 25 | 30 days supply | Qty: 30 | Fill #1

## 2019-03-24 ENCOUNTER — Telehealth: Payer: Self-pay | Admitting: Neurology

## 2019-03-24 NOTE — Telephone Encounter (Signed)
Patient left msg with after hours about the topimax medication. He prescribed 25mg  and they told her they can call in 50mg  and she would like to increase. Thanks!

## 2019-03-25 MED ORDER — TROKENDI XR 50 MG PO CP24
50.0000 mg | ORAL_CAPSULE | Freq: Every day | ORAL | 3 refills | Status: DC
Start: 2019-03-25 — End: 2019-07-19

## 2019-03-25 MED FILL — TROKENDI XR 50 MG CAPSULE: 50 | 30 days supply | Qty: 30 | Fill #0

## 2019-03-29 DIAGNOSIS — Z6830 Body mass index (BMI) 30.0-30.9, adult: Secondary | ICD-10-CM | POA: Diagnosis not present

## 2019-03-29 DIAGNOSIS — Z01419 Encounter for gynecological examination (general) (routine) without abnormal findings: Secondary | ICD-10-CM | POA: Diagnosis not present

## 2019-03-29 DIAGNOSIS — Z1231 Encounter for screening mammogram for malignant neoplasm of breast: Secondary | ICD-10-CM | POA: Diagnosis not present

## 2019-03-29 DIAGNOSIS — M8580 Other specified disorders of bone density and structure, unspecified site: Secondary | ICD-10-CM | POA: Diagnosis not present

## 2019-04-01 MED FILL — AIMOVIG 140 MG/ML SOAJ: 140 | 28 days supply | Qty: 1 | Fill #4

## 2019-04-05 ENCOUNTER — Other Ambulatory Visit: Payer: Self-pay | Admitting: Neurology

## 2019-04-05 MED FILL — ELETRIPTAN HBR 40 MG TABLET: 40 | 24 days supply | Qty: 24 | Fill #0

## 2019-04-11 ENCOUNTER — Ambulatory Visit: Payer: 59 | Admitting: Family Medicine

## 2019-04-13 MED FILL — LEVOTHYROXINE 100 MCG TAB: 100 | 90 days supply | Qty: 90 | Fill #1

## 2019-04-14 ENCOUNTER — Encounter: Payer: Self-pay | Admitting: Family Medicine

## 2019-04-14 ENCOUNTER — Ambulatory Visit: Payer: 59 | Admitting: Family Medicine

## 2019-04-14 ENCOUNTER — Other Ambulatory Visit: Payer: Self-pay

## 2019-04-14 DIAGNOSIS — G4486 Cervicogenic headache: Secondary | ICD-10-CM

## 2019-04-14 DIAGNOSIS — M999 Biomechanical lesion, unspecified: Secondary | ICD-10-CM

## 2019-04-14 DIAGNOSIS — R51 Headache: Secondary | ICD-10-CM | POA: Diagnosis not present

## 2019-04-14 NOTE — Assessment & Plan Note (Signed)
Cervicogenic headaches.  Discussed with patient again at great length, I do believe underlying anxiety is also contributing.  Discussed posture and anomalies.  Follow-up again in 4 to 8 weeks

## 2019-04-14 NOTE — Assessment & Plan Note (Signed)
Decision today to treat with OMT was based on Physical Exam  After verbal consent patient was treated with HVLA, ME, FPR techniques in cervical, thoracic, lumbar and sacral areas  Patient tolerated the procedure well with improvement in symptoms  Patient given exercises, stretches and lifestyle modifications  See medications in patient instructions if given  Patient will follow up in 4-6 weeks 

## 2019-04-14 NOTE — Patient Instructions (Signed)
Coop pillow  2 tennis balls in tube sock See me in 4-6 weeks

## 2019-04-14 NOTE — Progress Notes (Signed)
[name] [place], [place] [address] Phone: [phone] Subjective:     CC: Neck and back pain follow-up  [id number]  [name] is a 53 y.o. female coming in with complaint of neck pain.  Patient has had this for some time.  Musculoskeletal extreme patient has been increasing activity as tolerated, patient has not had some more stress recently.  Feels like that is been contributing to some of the discomfort and pain at the moment.  Patient denies any radiation to any of the extremities though.  Has been having to take her Topamax on a more regular basis.    Past Medical History:  Diagnosis Date  . History of ectopic pregnancy    04/ 2007  S/P LEFT SALPINGECTOMY  . History of kidney stones   . Hypothyroidism   . Menorrhagia   . Migraine    Past Surgical History:  Procedure Laterality Date  . CARPAL TUNNEL RELEASE Left 12/05/2017   Procedure: OPEN REDUCTION INTERNAL FIXATION (ORIF) WRIST FRACTURE WITH CARPAL TUNNEL RELEASE;  Surgeon: Roseanne Kaufman, MD;  Location: [facility];  Service: Orthopedics;  Laterality: Left;  . DILATATION & CURETTAGE/HYSTEROSCOPY WITH MYOSURE  2016  . DILATATION & CURETTAGE/HYSTEROSCOPY WITH MYOSURE N/A 10/23/2016   Procedure: DILATATION & CURETTAGE/HYSTEROSCOPY;  Surgeon: Everlene Farrier, MD;  Location: [facility];  Service: Gynecology;  Laterality: N/A;  . DILATION AND EVACUATION  10/07/2007   retained poc  . LAPAROSCOPIC UNILATERAL SALPINGECTOMY Left 11/24/2005   Social History   Socioeconomic History  . Marital status: Widowed    Spouse name: Not on file  . Number of children: 0  . Years of education: 4  . Highest education level: Not on file  Occupational History  . Occupation: [employer]  Social Needs  . Financial resource strain: Not on file  . Food insecurity    Worry: Not on file    Inability: Not on file  . Transportation needs    Medical: Not on file   Non-medical: Not on file  Tobacco Use  . Smoking status: Never Smoker  . Smokeless tobacco: Never Used  Substance and Sexual Activity  . Alcohol use: No  . Drug use: No  . Sexual activity: Not on file  Lifestyle  . Physical activity    Days per week: Not on file    Minutes per session: Not on file  . Stress: Not on file  Relationships  . Social Herbalist on phone: Not on file    Gets together: Not on file    Attends religious service: Not on file    Active member of club or organization: Not on file    Attends meetings of clubs or organizations: Not on file    Relationship status: Not on file  Other Topics Concern  . Not on file  Social History Narrative   Lives at home alone   Right-handed   Caffeine: weekly   No Known Allergies Family History  Problem Relation Age of Onset  . Hypertension Mother   . Aortic aneurysm Mother   . Liver cancer Father   . Prostate cancer Brother   . Migraines Sister     Current Outpatient Medications (Endocrine & Metabolic):  .  levothyroxine (SYNTHROID) 100 MCG tablet, Take 100 mcg by mouth daily before breakfast.    Current Outpatient Medications (Analgesics):  .  eletriptan (RELPAX) 40 MG tablet, TAKE 1 TABLET BY MOUTH EVERY 2  HOURS AS NEEDED FOR MIGRAINE OR HEADACHE. MAY REPEAT IN 2 HRS PRN, 2 TABS IN 24 HR MAX .  Erenumab-aooe (AIMOVIG) 140 MG/ML SOAJ, Inject 140 mg into the skin every 30 (thirty) days. .  Ibuprofen (ADVIL MIGRAINE) 200 MG CAPS, Take by mouth daily as needed.  Current Outpatient Medications (Hematological):  .  cyanocobalamin 500 MCG tablet, Take 400 mcg by mouth daily.  Current Outpatient Medications (Other):  .  cholecalciferol (VITAMIN D) 1000 units tablet, Take 1,000 Units by mouth daily. Marland Kitchen  co-enzyme Q-10 30 MG capsule, Take 100 mg by mouth 3 (three) times daily. .  Diclofenac Sodium (PENNSAID) 2 % SOLN, Place 2 application onto the skin 2 (two) times daily. .  magnesium gluconate (MAGONATE) 500  MG tablet, Take 500 mg by mouth 2 (two) times daily. .  Multiple Vitamin (MULTIVITAMIN WITH MINERALS) TABS tablet, Take 1 tablet by mouth daily. .  Riboflavin (VITAMIN B-2 PO), Take 1 capsule by mouth. Marland Kitchen  Specialty Vitamins Products (MAGNESIUM, AMINO ACID CHELATE,) 133 MG tablet, Take 1 tablet by mouth 2 (two) times daily. .  Topiramate ER (TROKENDI XR) 25 MG CP24, Take 1 capsule (25 mg total) by mouth at bedtime. .  Topiramate ER (TROKENDI XR) 50 MG CP24, Take 50 mg by mouth at bedtime. .  Turmeric 500 MG CAPS, Take 500 mg by mouth 2 (two) times daily. .  Vitamin D, Ergocalciferol, (DRISDOL) 50000 units CAPS capsule, Take 1 capsule (50,000 Units total) by mouth every 7 (seven) days.    Past medical history, social, surgical and family history all reviewed in electronic medical record.  No pertanent information unless stated regarding to the chief complaint.   Review of Systems:  No visual changes, nausea, vomiting, diarrhea, constipation, dizziness, abdominal pain, skin rash, fevers, chills, night sweats, weight loss, swollen lymph nodes, body aches, joint swelling,, chest pain, shortness of breath, mood changes.  Positive muscle aches, headaches  Objective     General: No apparent distress alert and oriented x3 mood and affect normal, dressed appropriately.  HEENT: Pupils equal, extraocular movements intact  Respiratory: Patient's speak in full sentences and does not appear short of breath  Cardiovascular: No lower extremity edema, non tender, no erythema  Skin: Warm dry intact with no signs of infection or rash on extremities or on axial skeleton.  Abdomen: Soft nontender  Neuro: Cranial nerves II through XII are intact, neurovascularly intact in all extremities with 2+ DTRs and 2+ pulses.  Lymph: No lymphadenopathy of posterior or anterior cervical chain or axillae bilaterally.  Gait normal with good balance and coordination.  MSK:  Non tender with full range of motion and good  stability and symmetric strength and tone of shoulders, elbows, wrist, hip, knee and ankles bilaterally. Neck exam shows some loss of lordosis, tightness in the trapezius area bilaterally.  Patient has some limited range of motion with sidebending and rotation bilaterally.  Negative Spurling's though.  Osteopathic findings  C2 flexed rotated and side bent left C4 flexed rotated and side bent right T3 extended rotated and side bent right inhaled third rib T8 extended rotated and side bent left L2 flexed rotated and side bent right Sacrum right on right     Impression and Recommendations:     This case required medical decision making of moderate complexity. The above documentation has been reviewed and is accurate and complete [name], DO       Note: This dictation was prepared with Dragon dictation along with smaller  Company secretary. Any transcriptional errors that result from this process are unintentional.

## 2019-04-28 MED FILL — TROKENDI XR 50 MG CAPSULE: 50 | 30 days supply | Qty: 30 | Fill #1

## 2019-05-02 MED FILL — ELETRIPTAN HBR 40 MG TABLET: 40 | 30 days supply | Qty: 24 | Fill #1

## 2019-05-02 MED FILL — AIMOVIG 140 MG/ML SOAJ: 140 | 28 days supply | Qty: 1 | Fill #5

## 2019-05-04 DIAGNOSIS — Z0000 Encounter for general adult medical examination without abnormal findings: Secondary | ICD-10-CM | POA: Diagnosis not present

## 2019-05-04 DIAGNOSIS — E7800 Pure hypercholesterolemia, unspecified: Secondary | ICD-10-CM | POA: Diagnosis not present

## 2019-05-04 DIAGNOSIS — Z1211 Encounter for screening for malignant neoplasm of colon: Secondary | ICD-10-CM | POA: Diagnosis not present

## 2019-05-04 DIAGNOSIS — E039 Hypothyroidism, unspecified: Secondary | ICD-10-CM | POA: Diagnosis not present

## 2019-05-04 DIAGNOSIS — K589 Irritable bowel syndrome without diarrhea: Secondary | ICD-10-CM | POA: Diagnosis not present

## 2019-05-04 DIAGNOSIS — G479 Sleep disorder, unspecified: Secondary | ICD-10-CM | POA: Diagnosis not present

## 2019-05-06 ENCOUNTER — Encounter: Payer: Self-pay | Admitting: *Deleted

## 2019-05-06 NOTE — Progress Notes (Signed)
[name] Key: [id number] help? Call us at [phone] Outcome Additional Information Required Prior Authorization is not required for this medication dosage form and strength at the quantity and days supply requested. Drug Eletriptan Hydrobromide 40MG  tablets Form MedImpact ePA Form

## 2019-05-15 NOTE — Progress Notes (Signed)
[name] [place], [place] [address] Phone: [phone] Subjective:    I'm seeing this patient by the request  of:    CC: Neck and back pain follow-up  [id number]    I, Wendy Poet, LAT, ATC, am serving as scribe for Dr. Hulan Saas  04/14/19: Cervicogenic headaches.  Discussed with patient again at great length, I do believe underlying anxiety is also contributing.  Discussed posture and anomalies.  Follow-up again in 4 to 8 weeks  Update - 05/16/19: [name] is a 53 y.o. female coming in with complaint of neck pain and headaches.  Pt states that she is feeling tight along her neck and upper back.  She notes that some days are better than others.  She states that she purchased the coop pillow but is having a hard time getting used to it.      Past Medical History:  Diagnosis Date  . History of ectopic pregnancy    04/ 2007  S/P LEFT SALPINGECTOMY  . History of kidney stones   . Hypothyroidism   . Menorrhagia   . Migraine    Past Surgical History:  Procedure Laterality Date  . CARPAL TUNNEL RELEASE Left 12/05/2017   Procedure: OPEN REDUCTION INTERNAL FIXATION (ORIF) WRIST FRACTURE WITH CARPAL TUNNEL RELEASE;  Surgeon: Roseanne Kaufman, MD;  Location: [facility];  Service: Orthopedics;  Laterality: Left;  . DILATATION & CURETTAGE/HYSTEROSCOPY WITH MYOSURE  2016  . DILATATION & CURETTAGE/HYSTEROSCOPY WITH MYOSURE N/A 10/23/2016   Procedure: DILATATION & CURETTAGE/HYSTEROSCOPY;  Surgeon: Everlene Farrier, MD;  Location: [facility];  Service: Gynecology;  Laterality: N/A;  . DILATION AND EVACUATION  10/07/2007   retained poc  . LAPAROSCOPIC UNILATERAL SALPINGECTOMY Left 11/24/2005   Social History   Socioeconomic History  . Marital status: Widowed    Spouse name: Not on file  . Number of children: 0  . Years of education: 6  . Highest education level: Not on file  Occupational History  . Occupation: [employer]  Social Needs  . Financial resource strain: Not on file  . Food insecurity    Worry: Not on file    Inability: Not on file  . Transportation needs    Medical: Not on file    Non-medical: Not on file  Tobacco Use  . Smoking status: Never Smoker  . Smokeless tobacco: Never Used  Substance and Sexual Activity  . Alcohol use: No  . Drug use: No  . Sexual activity: Not on file  Lifestyle  . Physical activity    Days per week: Not on file    Minutes per session: Not on file  . Stress: Not on file  Relationships  . Social Herbalist on phone: Not on file    Gets together: Not on file    Attends religious service: Not on file    Active member of club or organization: Not on file    Attends meetings of clubs or organizations: Not on file    Relationship status: Not on file  Other Topics Concern  . Not on file  Social History Narrative   Lives at home alone   Right-handed   Caffeine: weekly   No Known Allergies Family History  Problem Relation Age of Onset  . Hypertension Mother   . Aortic aneurysm Mother   . Liver cancer Father   . Prostate cancer Brother   . Migraines Sister  Current Outpatient Medications (Endocrine & Metabolic):  .  levothyroxine (SYNTHROID) 100 MCG tablet, Take 100 mcg by mouth daily before breakfast.    Current Outpatient Medications (Analgesics):  .  eletriptan (RELPAX) 40 MG tablet, TAKE 1 TABLET BY MOUTH EVERY 2 HOURS AS NEEDED FOR MIGRAINE OR HEADACHE. MAY REPEAT IN 2 HRS PRN, 2 TABS IN 24 HR MAX .  Erenumab-aooe (AIMOVIG) 140 MG/ML SOAJ, Inject 140 mg into the skin every 30 (thirty) days. .  Ibuprofen (ADVIL MIGRAINE) 200 MG CAPS, Take by mouth daily as needed.  Current Outpatient Medications (Hematological):  .  cyanocobalamin 500 MCG tablet, Take 400 mcg by mouth daily.  Current Outpatient Medications (Other):  .  cholecalciferol (VITAMIN D) 1000 units tablet, Take 1,000 Units by mouth daily. Marland Kitchen  co-enzyme  Q-10 30 MG capsule, Take 100 mg by mouth 3 (three) times daily. .  Diclofenac Sodium (PENNSAID) 2 % SOLN, Place 2 application onto the skin 2 (two) times daily. .  magnesium gluconate (MAGONATE) 500 MG tablet, Take 500 mg by mouth 2 (two) times daily. .  Multiple Vitamin (MULTIVITAMIN WITH MINERALS) TABS tablet, Take 1 tablet by mouth daily. .  Riboflavin (VITAMIN B-2 PO), Take 1 capsule by mouth. Marland Kitchen  Specialty Vitamins Products (MAGNESIUM, AMINO ACID CHELATE,) 133 MG tablet, Take 1 tablet by mouth 2 (two) times daily. .  Topiramate ER (TROKENDI XR) 25 MG CP24, Take 1 capsule (25 mg total) by mouth at bedtime. .  Topiramate ER (TROKENDI XR) 50 MG CP24, Take 50 mg by mouth at bedtime. .  Turmeric 500 MG CAPS, Take 500 mg by mouth 2 (two) times daily. .  Vitamin D, Ergocalciferol, (DRISDOL) 50000 units CAPS capsule, Take 1 capsule (50,000 Units total) by mouth every 7 (seven) days.    Past medical history, social, surgical and family history all reviewed in electronic medical record.  No pertanent information unless stated regarding to the chief complaint.   Review of Systems:  No headache, visual changes, nausea, vomiting, diarrhea, constipation, dizziness, abdominal pain, skin rash, fevers, chills, night sweats, weight loss, swollen lymph nodes, body aches, joint swelling, muscle aches, chest pain, shortness of breath, mood changes.   Objective  Blood pressure 124/86, pulse 72, height 5\' 7"  (1.702 m), weight 184 lb (83.5 kg), SpO2 96 %.    General: No apparent distress alert and oriented x3 mood and affect normal, dressed appropriately.  HEENT: Pupils equal, extraocular movements intact  Respiratory: Patient's speak in full sentences and does not appear short of breath  Cardiovascular: No lower extremity edema, non tender, no erythema  Skin: Warm dry intact with no signs of infection or rash on extremities or on axial skeleton.  Abdomen: Soft nontender  Neuro: Cranial nerves II through XII  are intact, neurovascularly intact in all extremities with 2+ DTRs and 2+ pulses.  Lymph: No lymphadenopathy of posterior or anterior cervical chain or axillae bilaterally.  Gait normal with good balance and coordination.  MSK:  tender with full range of motion and good stability and symmetric strength and tone of shoulders, elbows, wrist, hip, knee and ankles bilaterally.  Neck exam loss of lordosis.  Patient does have some tightness in the cervical thoracic region.  Tightness in the trapezius bilaterally  Low back exam shows tightness around the right sacroiliac joint, right Faber test positive.  Negative straight leg test.  Osteopathic findings  C2 flexed rotated and side bent right C4 flexed rotated and side bent left C6 flexed rotated and side bent left  T3 extended rotated and side bent right inhaled third rib T9 extended rotated and side bent left L2 flexed rotated and side bent right Sacrum right on right     Impression and Recommendations:     This case required medical decision making of moderate complexity. The above documentation has been reviewed and is accurate and complete [name], DO       Note: This dictation was prepared with Dragon dictation along with smaller phrase technology. Any transcriptional errors that result from this process are unintentional.

## 2019-05-16 ENCOUNTER — Encounter: Payer: Self-pay | Admitting: Family Medicine

## 2019-05-16 ENCOUNTER — Ambulatory Visit: Payer: 59 | Admitting: Family Medicine

## 2019-05-16 ENCOUNTER — Other Ambulatory Visit: Payer: Self-pay

## 2019-05-16 VITALS — BP 124/86 | HR 72 | Ht 67.0 in | Wt 184.0 lb

## 2019-05-16 DIAGNOSIS — R519 Headache, unspecified: Secondary | ICD-10-CM | POA: Diagnosis not present

## 2019-05-16 DIAGNOSIS — M999 Biomechanical lesion, unspecified: Secondary | ICD-10-CM | POA: Diagnosis not present

## 2019-05-16 DIAGNOSIS — G4486 Cervicogenic headache: Secondary | ICD-10-CM

## 2019-05-16 NOTE — Patient Instructions (Signed)
Keep trucking along.  Slow and steady.  Monitor posture.  See me again in 6 weeks

## 2019-05-16 NOTE — Assessment & Plan Note (Signed)
Patient does have more of a cervicogenic headache.  We discussed with patient in great length about icing regimen, home exercise, which activities to do which wants to avoid.  Patient will increase activity as tolerated.  Follow-up with me again 4 to 6 weeks

## 2019-05-16 NOTE — Assessment & Plan Note (Signed)
Decision today to treat with OMT was based on Physical Exam  After verbal consent patient was treated with HVLA, ME, FPR techniques in cervical, thoracic, lumbar and sacral areas  Patient tolerated the procedure well with improvement in symptoms  Patient given exercises, stretches and lifestyle modifications  See medications in patient instructions if given  Patient will follow up in 4-6 weeks 

## 2019-05-19 DIAGNOSIS — Z1159 Encounter for screening for other viral diseases: Secondary | ICD-10-CM | POA: Diagnosis not present

## 2019-05-25 DIAGNOSIS — Z1159 Encounter for screening for other viral diseases: Secondary | ICD-10-CM | POA: Diagnosis not present

## 2019-05-30 MED FILL — TROKENDI XR 50 MG CAPSULE: 50 | 30 days supply | Qty: 30 | Fill #2

## 2019-05-30 MED FILL — ELETRIPTAN HBR 40 MG TABLET: 40 | 30 days supply | Qty: 24 | Fill #2

## 2019-05-30 MED FILL — AIMOVIG 140 MG/ML SOAJ: 140 | 28 days supply | Qty: 1 | Fill #6

## 2019-05-31 ENCOUNTER — Encounter: Payer: Self-pay | Admitting: *Deleted

## 2019-05-31 NOTE — Progress Notes (Addendum)
[name] (Key: [id number]) Aimovig 140MG /ML auto-injectors   Form MedImpact ePA Form Created 6 days ago Sent to Plan 3 days ago Plan Response 3 days ago Submit Clinical Questions 3 days ago Determination Favorable 19 hours ago Message from Plan The request has been approved. The authorization is effective for a maximum of 12 fills from 05/30/2019 to 05/28/2020, as long as the member is enrolled in their current health plan. The request was approved as submitted. This request is approved for 60mL per month. Please note this medication must be filled and/or managed by a [facility] outpatient pharmacy. Please call [phone]). A written notification letter will follow with additional details.Marland Kitchen

## 2019-06-22 ENCOUNTER — Encounter: Payer: Self-pay | Admitting: Family Medicine

## 2019-06-22 ENCOUNTER — Ambulatory Visit: Payer: 59 | Admitting: Family Medicine

## 2019-06-22 ENCOUNTER — Other Ambulatory Visit: Payer: Self-pay

## 2019-06-22 VITALS — BP 122/78 | HR 73 | Ht 67.0 in | Wt 184.0 lb

## 2019-06-22 DIAGNOSIS — R519 Headache, unspecified: Secondary | ICD-10-CM

## 2019-06-22 DIAGNOSIS — G4486 Cervicogenic headache: Secondary | ICD-10-CM

## 2019-06-22 DIAGNOSIS — M999 Biomechanical lesion, unspecified: Secondary | ICD-10-CM

## 2019-06-22 NOTE — Patient Instructions (Signed)
DHEA 50 mg daily for 4 weeks Keep doing ex See me again in 5-6 weeks otherwise we need sleep study

## 2019-06-22 NOTE — Progress Notes (Signed)
[name] [place], [place] [address] Phone: [phone] Subjective:   [name], am serving as a scribe for Dr. Hulan Saas.   CC: Neck pain  [id number]   05/16/2019 Patient does have more of a cervicogenic headache.  We discussed with patient in great length about icing regimen, home exercise, which activities to do which wants to avoid.  Patient will increase activity as tolerated.  Follow-up with me again 4 to 6 weeks  Update 06/22/2019 [name] is a 53 y.o. female coming in with complaint of neck pain. Patient is here for OMT. Patient states that her pain is increasing recently and she does not know why. She is having more pain in the posterior aspect of head. Headaches are daily and usually at 3am. Is using more of her medication for headaches. Also doing home exercises which she feels have helped somewhat.     Past Medical History:  Diagnosis Date  . History of ectopic pregnancy    04/ 2007  S/P LEFT SALPINGECTOMY  . History of kidney stones   . Hypothyroidism   . Menorrhagia   . Migraine    Past Surgical History:  Procedure Laterality Date  . CARPAL TUNNEL RELEASE Left 12/05/2017   Procedure: OPEN REDUCTION INTERNAL FIXATION (ORIF) WRIST FRACTURE WITH CARPAL TUNNEL RELEASE;  Surgeon: Roseanne Kaufman, MD;  Location: [facility];  Service: Orthopedics;  Laterality: Left;  . DILATATION & CURETTAGE/HYSTEROSCOPY WITH MYOSURE  2016  . DILATATION & CURETTAGE/HYSTEROSCOPY WITH MYOSURE N/A 10/23/2016   Procedure: DILATATION & CURETTAGE/HYSTEROSCOPY;  Surgeon: Everlene Farrier, MD;  Location: [facility];  Service: Gynecology;  Laterality: N/A;  . DILATION AND EVACUATION  10/07/2007   retained poc  . LAPAROSCOPIC UNILATERAL SALPINGECTOMY Left 11/24/2005   Social History   Socioeconomic History  . Marital status: Widowed    Spouse name: Not on file  . Number of children: 0  . Years of education: 65  . Highest  education level: Not on file  Occupational History  . Occupation: [employer]  Social Needs  . Financial resource strain: Not on file  . Food insecurity    Worry: Not on file    Inability: Not on file  . Transportation needs    Medical: Not on file    Non-medical: Not on file  Tobacco Use  . Smoking status: Never Smoker  . Smokeless tobacco: Never Used  Substance and Sexual Activity  . Alcohol use: Cook  . Drug use: Cook  . Sexual activity: Not on file  Lifestyle  . Physical activity    Days per week: Not on file    Minutes per session: Not on file  . Stress: Not on file  Relationships  . Social Herbalist on phone: Not on file    Gets together: Not on file    Attends religious service: Not on file    Active member of club or organization: Not on file    Attends meetings of clubs or organizations: Not on file    Relationship status: Not on file  Other Topics Concern  . Not on file  Social History Narrative   Lives at home alone   Right-handed   Caffeine: weekly   Cook Known Allergies Family History  Problem Relation Age of Onset  . Hypertension Mother   . Aortic aneurysm Mother   . Liver cancer Father   . Prostate cancer Brother   .  Migraines Sister     Current Outpatient Medications (Endocrine & Metabolic):  .  levothyroxine (SYNTHROID) 100 MCG tablet, Take 100 mcg by mouth daily before breakfast.    Current Outpatient Medications (Analgesics):  .  eletriptan (RELPAX) 40 MG tablet, TAKE 1 TABLET BY MOUTH EVERY 2 HOURS AS NEEDED FOR MIGRAINE OR HEADACHE. MAY REPEAT IN 2 HRS PRN, 2 TABS IN 24 HR MAX .  Erenumab-aooe (AIMOVIG) 140 MG/ML SOAJ, Inject 140 mg into the skin every 30 (thirty) days. .  Ibuprofen (ADVIL MIGRAINE) 200 MG CAPS, Take by mouth daily as needed.  Current Outpatient Medications (Hematological):  .  cyanocobalamin 500 MCG tablet, Take 400 mcg by mouth daily.  Current Outpatient Medications (Other):  .  cholecalciferol  (VITAMIN D) 1000 units tablet, Take 1,000 Units by mouth daily. Marland Kitchen  co-enzyme Q-10 30 MG capsule, Take 100 mg by mouth 3 (three) times daily. .  Diclofenac Sodium (PENNSAID) 2 % SOLN, Place 2 application onto the skin 2 (two) times daily. .  magnesium gluconate (MAGONATE) 500 MG tablet, Take 500 mg by mouth 2 (two) times daily. .  Multiple Vitamin (MULTIVITAMIN WITH MINERALS) TABS tablet, Take 1 tablet by mouth daily. .  Riboflavin (VITAMIN B-2 PO), Take 1 capsule by mouth. Marland Kitchen  Specialty Vitamins Products (MAGNESIUM, AMINO ACID CHELATE,) 133 MG tablet, Take 1 tablet by mouth 2 (two) times daily. .  Topiramate ER (TROKENDI XR) 25 MG CP24, Take 1 capsule (25 mg total) by mouth at bedtime. .  Topiramate ER (TROKENDI XR) 50 MG CP24, Take 50 mg by mouth at bedtime. .  Turmeric 500 MG CAPS, Take 500 mg by mouth 2 (two) times daily. .  Vitamin D, Ergocalciferol, (DRISDOL) 50000 units CAPS capsule, Take 1 capsule (50,000 Units total) by mouth every 7 (seven) days.    Past medical history, social, surgical and family history all reviewed in electronic medical record.  Cook pertanent information unless stated regarding to the chief complaint.   Review of Systems:  Cook headache, visual changes, nausea, vomiting, diarrhea, constipation, dizziness, abdominal pain, skin rash, fevers, chills, night sweats, weight loss, swollen lymph nodes, body aches, joint swelling,  chest pain, shortness of breath, mood changes.  Positive muscle aches  Objective  Blood pressure 122/78, pulse 73, height 5\' 7"  (1.702 m), weight 184 lb (83.5 kg), SpO2 98 %.    General: Cook apparent distress alert and oriented x3 mood and affect normal, dressed appropriately.  HEENT: Pupils equal, extraocular movements intact  Respiratory: Patient's speak in full sentences and does not appear short of breath  Cardiovascular: Cook lower extremity edema, non tender, Cook erythema  Skin: Warm dry intact with Cook signs of infection or rash on extremities  or on axial skeleton.  Abdomen: Soft nontender  Neuro: Cranial nerves II through XII are intact, neurovascularly intact in all extremities with 2+ DTRs and 2+ pulses.  Lymph: Cook lymphadenopathy of posterior or anterior cervical chain or axillae bilaterally.  Gait normal with good balance and coordination.  MSK:  tender with full range of motion and good stability and symmetric strength and tone of shoulders, elbows, wrist, hip, knee and ankles bilaterally.  Neck exam shows significant tightness noted.  Patient has tender to palpation in the parascapular region bilaterally.  Significant tightness at the occipital area as well.  Negative Spurling's are noted.   Osteopathic findings  C2 flexed rotated and side bent right T9 extended rotated and side bent left L2 flexed rotated and side bent right Sacrum left  on left    Impression and Recommendations:     This case required medical decision making of moderate complexity. The above documentation has been reviewed and is accurate and complete [name], DO       Note: This dictation was prepared with Dragon dictation along with smaller phrase technology. Any transcriptional errors that result from this process are unintentional.

## 2019-06-22 NOTE — Assessment & Plan Note (Signed)
Decision today to treat with OMT was based on Physical Exam  After verbal consent patient was treated with HVLA, ME, FPR techniques in cervical, thoracic, lumbar and sacral areas  Patient tolerated the procedure well with improvement in symptoms  Patient given exercises, stretches and lifestyle modifications  See medications in patient instructions if given  Patient will follow up in 4-8 weeks 

## 2019-06-22 NOTE — Assessment & Plan Note (Signed)
Cervicogenic.  Patient continues to have tightness.  Discussed with patient about sleeping position, possible sleep study.  Patient has been having headaches at the same time every morning.  Discussed over-the-counter medications.  Follow-up again in 4 to 6 weeks and if continued to have trouble consider sleep study

## 2019-06-24 MED FILL — AIMOVIG 140 MG/ML SOAJ: 140 | 28 days supply | Qty: 1 | Fill #7

## 2019-06-24 MED FILL — TROKENDI XR 50 MG CAPSULE: 50 | 30 days supply | Qty: 30 | Fill #3

## 2019-06-25 MED FILL — ELETRIPTAN HBR 40 MG TABLET: 40 | 30 days supply | Qty: 24 | Fill #3

## 2019-07-02 DIAGNOSIS — Z1159 Encounter for screening for other viral diseases: Secondary | ICD-10-CM | POA: Diagnosis not present

## 2019-07-15 MED FILL — LEVOTHYROXINE SODIUM 100 MC: 100 | 90 days supply | Qty: 90 | Fill #2
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
## 2019-07-19 ENCOUNTER — Telehealth (INDEPENDENT_AMBULATORY_CARE_PROVIDER_SITE_OTHER): Payer: 59 | Admitting: Neurology

## 2019-07-19 ENCOUNTER — Other Ambulatory Visit: Payer: Self-pay

## 2019-07-19 ENCOUNTER — Encounter: Payer: Self-pay | Admitting: Neurology

## 2019-07-19 VITALS — Ht 67.0 in | Wt 180.0 lb

## 2019-07-19 DIAGNOSIS — G43009 Migraine without aura, not intractable, without status migrainosus: Secondary | ICD-10-CM | POA: Diagnosis not present

## 2019-07-19 DIAGNOSIS — M542 Cervicalgia: Secondary | ICD-10-CM

## 2019-07-19 DIAGNOSIS — G4486 Cervicogenic headache: Secondary | ICD-10-CM

## 2019-07-19 MED ORDER — ELETRIPTAN HYDROBROMIDE 40 MG PO TABS: ORAL_TABLET | ORAL | 3 refills | Status: DC

## 2019-07-19 MED ORDER — GABAPENTIN 100 MG PO CAPS: ORAL_CAPSULE | ORAL | 0 refills | Status: DC

## 2019-07-19 MED FILL — GABAPENTIN 100 MG CAPSULE: 100 | 44 days supply | Qty: 90 | Fill #0

## 2019-07-22 ENCOUNTER — Encounter: Payer: Self-pay | Admitting: *Deleted

## 2019-07-22 MED FILL — AIMOVIG 140 MG/ML SOAJ: 140 | 28 days supply | Qty: 1 | Fill #8

## 2019-07-23 MED FILL — ELETRIPTAN HBR 40 MG TABLET: 40 | 30 days supply | Qty: 24 | Fill #0

## 2019-07-26 ENCOUNTER — Encounter: Payer: Self-pay | Admitting: Family Medicine

## 2019-07-26 ENCOUNTER — Ambulatory Visit: Payer: 59 | Admitting: Family Medicine

## 2019-07-26 VITALS — BP 120/84 | HR 68 | Ht 67.0 in | Wt 180.0 lb

## 2019-07-26 DIAGNOSIS — R519 Headache, unspecified: Secondary | ICD-10-CM

## 2019-07-26 DIAGNOSIS — M999 Biomechanical lesion, unspecified: Secondary | ICD-10-CM

## 2019-07-26 DIAGNOSIS — G4486 Cervicogenic headache: Secondary | ICD-10-CM

## 2019-07-26 NOTE — Patient Instructions (Addendum)
  [address] 1st floor [place], [place] [address] Phone [phone]  Happy Holidays! See me again 6 weeks and send a message about MRI

## 2019-07-26 NOTE — Assessment & Plan Note (Signed)
Continues to have some difficulty.  Patient is taking gabapentin on a more regular basis.  Patient continues to see neurologist as well which I think is beneficial.  Continue to try osteopathic manipulation.  Follow-up again in 4 to 8 weeks

## 2019-07-26 NOTE — Assessment & Plan Note (Signed)
Decision today to treat with OMT was based on Physical Exam  After verbal consent patient was treated with HVLA, ME, FPR techniques in cervical, thoracic, rib areas  Patient tolerated the procedure well with improvement in symptoms  Patient given exercises, stretches and lifestyle modifications  See medications in patient instructions if given  Patient will follow up in 4-8 weeks 

## 2019-07-26 NOTE — Progress Notes (Signed)
[name] [address] [place], [place] [address] Phone: [phone] Subjective:   [name], am serving as a scribe for Dr. Hulan Saas. This visit occurred during the SARS-CoV-2 public health emergency.  Safety protocols were in place, including screening questions prior to the visit, additional usage of staff PPE, and extensive cleaning of exam room while observing appropriate contact time as indicated for disinfecting solutions.  I'm seeing this patient by the request  of:    CC: Neck and headache pain follow-up  [id number]  [name] is a 53 y.o. female coming in with complaint of neck pain. Last seen on 06/22/2019 for OMT. Patient states that most of her pain is at night. Patient continues to have headaches. Is seeing neurology and is trying gabapentin which is helping.       Past Medical History:  Diagnosis Date  . History of ectopic pregnancy    04/ 2007  S/P LEFT SALPINGECTOMY  . History of kidney stones   . Hypothyroidism   . Menorrhagia   . Migraine    Past Surgical History:  Procedure Laterality Date  . CARPAL TUNNEL RELEASE Left 12/05/2017   Procedure: OPEN REDUCTION INTERNAL FIXATION (ORIF) WRIST FRACTURE WITH CARPAL TUNNEL RELEASE;  Surgeon: Roseanne Kaufman, MD;  Location: [facility];  Service: Orthopedics;  Laterality: Left;  . DILATATION & CURETTAGE/HYSTEROSCOPY WITH MYOSURE  2016  . DILATATION & CURETTAGE/HYSTEROSCOPY WITH MYOSURE N/A 10/23/2016   Procedure: DILATATION & CURETTAGE/HYSTEROSCOPY;  Surgeon: Everlene Farrier, MD;  Location: [facility];  Service: Gynecology;  Laterality: N/A;  . DILATION AND EVACUATION  10/07/2007   retained poc  . LAPAROSCOPIC UNILATERAL SALPINGECTOMY Left 11/24/2005   Social History   Socioeconomic History  . Marital status: Widowed    Spouse name: Not on file  . Number of children: 0  . Years of education: 16  . Highest education level: Bachelor's degree (e.g., BA, AB,  BS)  Occupational History  . Occupation: [employer]  Tobacco Use  . Smoking status: Never Smoker  . Smokeless tobacco: Never Used  Substance and Sexual Activity  . Alcohol use: No  . Drug use: No  . Sexual activity: Not on file  Other Topics Concern  . Not on file  Social History Narrative   Lives at home alone   Right-handed   Caffeine: weekly   No children   widow   Social Determinants of Health   Financial Resource Strain:   . Difficulty of Paying Living Expenses: Not on file  Food Insecurity:   . Worried About Charity fundraiser in the Last Year: Not on file  . Ran Out of Food in the Last Year: Not on file  Transportation Needs:   . Lack of Transportation (Medical): Not on file  . Lack of Transportation (Non-Medical): Not on file  Physical Activity:   . Days of Exercise per Week: Not on file  . Minutes of Exercise per Session: Not on file  Stress:   . Feeling of Stress : Not on file  Social Connections:   . Frequency of Communication with Friends and Family: Not on file  . Frequency of Social Gatherings with Friends and Family: Not on file  . Attends Religious Services: Not on file  . Active Member of Clubs or Organizations: Not on file  . Attends Archivist Meetings: Not on file  . Marital Status: Not on file   No Known Allergies Family History  Problem Relation Age of Onset  . Hypertension Mother   . Aortic aneurysm Mother   . Liver cancer Father   . Prostate cancer Brother   . Migraines Sister     Current Outpatient Medications (Endocrine & Metabolic):  .  levothyroxine (SYNTHROID) 100 MCG tablet, Take 100 mcg by mouth daily before breakfast.    Current Outpatient Medications (Analgesics):  .  eletriptan (RELPAX) 40 MG tablet, TAKE 1 TABLET BY MOUTH EVERY 2 HOURS AS NEEDED FOR MIGRAINE OR HEADACHE. MAY REPEAT IN 2 HRS PRN, 2 TABS IN 24 HR MAX .  Erenumab-aooe (AIMOVIG) 140 MG/ML SOAJ, Inject 140 mg into the skin every 30  (thirty) days. .  Ibuprofen (ADVIL MIGRAINE) 200 MG CAPS, Take by mouth daily as needed.  Current Outpatient Medications (Hematological):  .  cyanocobalamin 500 MCG tablet, Take 400 mcg by mouth daily.  Current Outpatient Medications (Other):  .  cholecalciferol (VITAMIN D) 1000 units tablet, Take 1,000 Units by mouth daily. Marland Kitchen  co-enzyme Q-10 30 MG capsule, Take 100 mg by mouth 3 (three) times daily. .  Diclofenac Sodium (PENNSAID) 2 % SOLN, Place 2 application onto the skin 2 (two) times daily. Marland Kitchen  gabapentin (NEURONTIN) 100 MG capsule, Take 1 capsule at bedtime for 1 week, then 2 capsules at bedtime for 1 week, then 3 capsules at bedtime. .  magnesium gluconate (MAGONATE) 500 MG tablet, Take 500 mg by mouth 2 (two) times daily. .  Multiple Vitamin (MULTIVITAMIN WITH MINERALS) TABS tablet, Take 1 tablet by mouth daily. .  Riboflavin (VITAMIN B-2 PO), Take 1 capsule by mouth. Marland Kitchen  Specialty Vitamins Products (MAGNESIUM, AMINO ACID CHELATE,) 133 MG tablet, Take 1 tablet by mouth 2 (two) times daily. .  Turmeric 500 MG CAPS, Take 500 mg by mouth 2 (two) times daily. .  Vitamin D, Ergocalciferol, (DRISDOL) 50000 units CAPS capsule, Take 1 capsule (50,000 Units total) by mouth every 7 (seven) days.    Past medical history, social, surgical and family history all reviewed in electronic medical record.  No pertanent information unless stated regarding to the chief complaint.   Review of Systems:  No , visual changes, nausea, vomiting, diarrhea, constipation, dizziness, abdominal pain, skin rash, fevers, chills, night sweats, weight loss, swollen lymph nodes, body aches, joint swelling, muscle aches, chest pain, shortness of breath, mood changes.  Positive muscle aches, headache  Objective  Blood pressure 120/84, pulse 68, height 5\' 7"  (1.702 m), weight 180 lb (81.6 kg), SpO2 98 %.    General: No apparent distress alert and oriented x3 mood and affect normal, dressed appropriately.  HEENT: Pupils  equal, extraocular movements intact  Respiratory: Patient's speak in full sentences and does not appear short of breath  Cardiovascular: No lower extremity edema, non tender, no erythema  Skin: Warm dry intact with no signs of infection or rash on extremities or on axial skeleton.  Abdomen: Soft nontender  Neuro: Cranial nerves II through XII are intact, neurovascularly intact in all extremities with 2+ DTRs and 2+ pulses.  Lymph: No lymphadenopathy of posterior or anterior cervical chain or axillae bilaterally.  Gait normal with good balance and coordination.  MSK:  Non tender with full range of motion and good stability and symmetric strength and tone of shoulders, elbows, wrist, hip, knee and ankles bilaterally.  Neck: Inspection mild loss of lordosis.Marland Kitchen No palpable stepoffs. Negative Spurling's maneuver. Mild limited range of motion in all planes 5 to 10 degrees. Grip strength and sensation normal in bilateral hands Strength  good C4 to T1 distribution No sensory change to C4 to T1 Negative Hoffman sign bilaterally Reflexes normal Pain in the trapezius bilaterally.  Osteopathic findings  C4 flexed rotated and side bent left C6 flexed rotated and side bent left T3 extended rotated and side bent right inhaled third rib    Impression and Recommendations:     This case required medical decision making of moderate complexity. The above documentation has been reviewed and is accurate and complete Mallory Pulley, DO       Note: This dictation was prepared with Dragon dictation along with smaller phrase technology. Any transcriptional errors that result from this process are unintentional.

## 2019-08-11 ENCOUNTER — Other Ambulatory Visit: Payer: Self-pay

## 2019-08-11 MED ORDER — GABAPENTIN 100 MG PO CAPS: 400.0000 mg | ORAL_CAPSULE | Freq: Every day | ORAL | 1 refills | Status: DC

## 2019-08-11 MED FILL — GABAPENTIN 100 MG CAPSULE: 100 | 90 days supply | Qty: 360 | Fill #0

## 2019-08-18 MED FILL — AIMOVIG 140 MG/ML SOAJ: 140 | 28 days supply | Qty: 1 | Fill #9

## 2019-08-19 ENCOUNTER — Encounter: Payer: Self-pay | Admitting: Family Medicine

## 2019-08-20 ENCOUNTER — Ambulatory Visit
Admission: RE | Admit: 2019-08-20 | Discharge: 2019-08-20 | Disposition: A | Discharge status: Home / Self Care | Payer: 59 | Source: Ambulatory Visit | Attending: Neurology | Admitting: Neurology

## 2019-08-20 ENCOUNTER — Other Ambulatory Visit: Payer: 59

## 2019-08-20 ENCOUNTER — Other Ambulatory Visit: Payer: Self-pay

## 2019-08-20 DIAGNOSIS — M50221 Other cervical disc displacement at C4-C5 level: Secondary | ICD-10-CM | POA: Diagnosis not present

## 2019-08-20 DIAGNOSIS — M542 Cervicalgia: Secondary | ICD-10-CM

## 2019-08-20 DIAGNOSIS — G4486 Cervicogenic headache: Secondary | ICD-10-CM

## 2019-08-20 DIAGNOSIS — M50222 Other cervical disc displacement at C5-C6 level: Secondary | ICD-10-CM | POA: Diagnosis not present

## 2019-08-20 DIAGNOSIS — M899 Disorder of bone, unspecified: Secondary | ICD-10-CM | POA: Diagnosis not present

## 2019-08-20 DIAGNOSIS — M50223 Other cervical disc displacement at C6-C7 level: Secondary | ICD-10-CM | POA: Diagnosis not present

## 2019-08-20 MED FILL — ELETRIPTAN HBR 40 MG TABLET: 40 | 30 days supply | Qty: 12 | Fill #1

## 2019-08-22 ENCOUNTER — Telehealth: Payer: Self-pay | Admitting: Neurology

## 2019-08-22 NOTE — Telephone Encounter (Signed)
Patient is calling in about needing a PA for the Eletriptan medication to the pharm on file and back date it to 08/20/2019. So that she can get the remaining 2 boxes for this month. Thanks!

## 2019-08-22 NOTE — Telephone Encounter (Signed)
Can you help with this once you have time.I am unsure

## 2019-08-23 ENCOUNTER — Encounter: Payer: Self-pay | Admitting: Neurology

## 2019-08-23 NOTE — Progress Notes (Signed)
Pa submitted Key: [id number]

## 2019-08-23 NOTE — Telephone Encounter (Signed)
Patient called in that she received a msg we needed her ins info for the PA- She said her insurance hasn't changed. She still has the [facility] insurance that is on file. Just FYI. Thanks!

## 2019-08-24 NOTE — Progress Notes (Signed)
Received fax from [place] with approval valid 08/23/2019-08/21/2020.  24 tablets per 30 days approval

## 2019-08-26 ENCOUNTER — Other Ambulatory Visit: Payer: Self-pay

## 2019-09-01 ENCOUNTER — Telehealth: Payer: Self-pay | Admitting: Neurology

## 2019-09-01 NOTE — Telephone Encounter (Signed)
Patient called in needing to speak with the nurse regarding the Prior Auth on her medication Relpax. She said that the Pharmacist had told her to let out office know to back date it to 08/20/19 and instead it was dated for 08/24/19. She said that she needs 4 boxes? She is unable to get her medication. She uses [facility] Please Call. Thanks

## 2019-09-01 NOTE — Telephone Encounter (Signed)
Please advise, on this, this cant be backdated

## 2019-09-01 NOTE — Telephone Encounter (Signed)
No answer, no samples here, may have to wait until time rx is due, meds cant be backdated.

## 2019-09-01 NOTE — Telephone Encounter (Signed)
I don't know what to do about that

## 2019-09-03 MED FILL — ELETRIPTAN HBR 40 MG TABLET: 40 | 15 days supply | Qty: 12 | Fill #2

## 2019-09-06 ENCOUNTER — Ambulatory Visit: Payer: 59 | Admitting: Family Medicine

## 2019-09-06 ENCOUNTER — Encounter: Payer: Self-pay | Admitting: Family Medicine

## 2019-09-06 ENCOUNTER — Other Ambulatory Visit: Payer: Self-pay

## 2019-09-06 VITALS — BP 110/78 | HR 79 | Ht 67.0 in | Wt 180.0 lb

## 2019-09-06 DIAGNOSIS — M999 Biomechanical lesion, unspecified: Secondary | ICD-10-CM | POA: Diagnosis not present

## 2019-09-06 DIAGNOSIS — G4486 Cervicogenic headache: Secondary | ICD-10-CM

## 2019-09-06 DIAGNOSIS — R519 Headache, unspecified: Secondary | ICD-10-CM | POA: Diagnosis not present

## 2019-09-06 NOTE — Progress Notes (Signed)
[place] Phone: [phone] Subjective:   [name], am serving as a scribe for Dr. Hulan Saas. This visit occurred during the SARS-CoV-2 public health emergency.  Safety protocols were in place, including screening questions prior to the visit, additional usage of staff PPE, and extensive cleaning of exam room while observing appropriate contact time as indicated for disinfecting solutions.   I'm seeing this patient by the request  of:  Lawerance Cruel, MD  CC: Headache and neck pain follow-up  [id number]  [name] is a 54 y.o. female coming in with complaint of neck pain and headaches. Patient last seen on 07/26/2019 for OMT. Patient states that she has had a few days without headaches. Does have a headache today. Headaches are more in frontal lobe.  Patient states that this is more like her regular migraines.  Patient denies as much neck pain recently.  Is trying to work on her posture and ergonomics on a more regular detail at the moment.     Past Medical History:  Diagnosis Date  . History of ectopic pregnancy    04/ 2007  S/P LEFT SALPINGECTOMY  . History of kidney stones   . Hypothyroidism   . Menorrhagia   . Migraine    Past Surgical History:  Procedure Laterality Date  . CARPAL TUNNEL RELEASE Left 12/05/2017   Procedure: OPEN REDUCTION INTERNAL FIXATION (ORIF) WRIST FRACTURE WITH CARPAL TUNNEL RELEASE;  Surgeon: Roseanne Kaufman, MD;  Location: [place];  Service: Orthopedics;  Laterality: Left;  . DILATATION & CURETTAGE/HYSTEROSCOPY WITH MYOSURE  2016  . DILATATION & CURETTAGE/HYSTEROSCOPY WITH MYOSURE N/A 10/23/2016   Procedure: DILATATION & CURETTAGE/HYSTEROSCOPY;  Surgeon: Everlene Farrier, MD;  Location: [place];  Service: Gynecology;  Laterality: N/A;  . DILATION AND EVACUATION  10/07/2007   retained poc  . LAPAROSCOPIC UNILATERAL SALPINGECTOMY Left 11/24/2005    Social History   Socioeconomic History  . Marital status: Widowed    Spouse name: Not on file  . Number of children: 0  . Years of education: 16  . Highest education level: Bachelor's degree (e.g., BA, AB, BS)  Occupational History  . Occupation: [employer]  Tobacco Use  . Smoking status: Never Smoker  . Smokeless tobacco: Never Used  Substance and Sexual Activity  . Alcohol use: No  . Drug use: No  . Sexual activity: Not on file  Other Topics Concern  . Not on file  Social History Narrative   Lives at home alone   Right-handed   Caffeine: weekly   No children   widow   Social Determinants of Health   Financial Resource Strain:   . Difficulty of Paying Living Expenses: Not on file  Food Insecurity:   . Worried About Charity fundraiser in the Last Year: Not on file  . Ran Out of Food in the Last Year: Not on file  Transportation Needs:   . Lack of Transportation (Medical): Not on file  . Lack of Transportation (Non-Medical): Not on file  Physical Activity:   . Days of Exercise per Week: Not on file  . Minutes of Exercise per Session: Not on file  Stress:   . Feeling of Stress : Not on file  Social Connections:   . Frequency of Communication with Friends and Family: Not on file  . Frequency of Social Gatherings with Friends and Family: Not on file  . Attends Religious Services:  Not on file  . Active Member of Clubs or Organizations: Not on file  . Attends Archivist Meetings: Not on file  . Marital Status: Not on file   No Known Allergies Family History  Problem Relation Age of Onset  . Hypertension Mother   . Aortic aneurysm Mother   . Liver cancer Father   . Prostate cancer Brother   . Migraines Sister     Current Outpatient Medications (Endocrine & Metabolic):  .  levothyroxine (SYNTHROID) 100 MCG tablet, Take 100 mcg by mouth daily before breakfast.    Current Outpatient Medications (Analgesics):  .  eletriptan  (RELPAX) 40 MG tablet, TAKE 1 TABLET BY MOUTH EVERY 2 HOURS AS NEEDED FOR MIGRAINE OR HEADACHE. MAY REPEAT IN 2 HRS PRN, 2 TABS IN 24 HR MAX .  Erenumab-aooe (AIMOVIG) 140 MG/ML SOAJ, Inject 140 mg into the skin every 30 (thirty) days. .  Ibuprofen (ADVIL MIGRAINE) 200 MG CAPS, Take by mouth daily as needed.  Current Outpatient Medications (Hematological):  .  cyanocobalamin 500 MCG tablet, Take 400 mcg by mouth daily.  Current Outpatient Medications (Other):  .  cholecalciferol (VITAMIN D) 1000 units tablet, Take 1,000 Units by mouth daily. Marland Kitchen  co-enzyme Q-10 30 MG capsule, Take 100 mg by mouth 3 (three) times daily. .  Diclofenac Sodium (PENNSAID) 2 % SOLN, Place 2 application onto the skin 2 (two) times daily. Marland Kitchen  gabapentin (NEURONTIN) 100 MG capsule, Take 4 capsules (400 mg total) by mouth at bedtime. .  magnesium gluconate (MAGONATE) 500 MG tablet, Take 500 mg by mouth 2 (two) times daily. .  Multiple Vitamin (MULTIVITAMIN WITH MINERALS) TABS tablet, Take 1 tablet by mouth daily. .  Riboflavin (VITAMIN B-2 PO), Take 1 capsule by mouth. Marland Kitchen  Specialty Vitamins Products (MAGNESIUM, AMINO ACID CHELATE,) 133 MG tablet, Take 1 tablet by mouth 2 (two) times daily. .  Turmeric 500 MG CAPS, Take 500 mg by mouth 2 (two) times daily. .  Vitamin D, Ergocalciferol, (DRISDOL) 50000 units CAPS capsule, Take 1 capsule (50,000 Units total) by mouth every 7 (seven) days.   Reviewed prior external information including notes and imaging from  primary care provider As well as notes that were available from care everywhere and other healthcare systems.  Past medical history, social, surgical and family history all reviewed in electronic medical record.  No pertanent information unless stated regarding to the chief complaint.   Review of Systems:  No headache, visual changes, nausea, vomiting, diarrhea, constipation, dizziness, abdominal pain, skin rash, fevers, chills, night sweats, weight loss, swollen  lymph nodes, body aches, joint swelling, chest pain, shortness of breath, mood changes. POSITIVE muscle aches  Objective  Blood pressure 110/78, pulse 79, height 5\' 7"  (1.702 m), weight 180 lb (81.6 kg), SpO2 99 %.   General: No apparent distress alert and oriented x3 mood and affect normal, dressed appropriately.  HEENT: Pupils equal, extraocular movements intact  Respiratory: Patient's speak in full sentences and does not appear short of breath  Cardiovascular: No lower extremity edema, non tender, no erythema  Skin: Warm dry intact with no signs of infection or rash on extremities or on axial skeleton.  Abdomen: Soft nontender  Neuro: Cranial nerves II through XII are intact, neurovascularly intact in all extremities with 2+ DTRs and 2+ pulses.  Lymph: No lymphadenopathy of posterior or anterior cervical chain or axillae bilaterally.  Gait normal with good balance and coordination.  MSK:  tender with full range of motion and good  stability and symmetric strength and tone of shoulders, elbows, wrist, hip, knee and ankles bilaterally.  Neck exam does have some mild loss of lordosis.  Patient does have some mild increase in kyphosis of the upper thoracic spine. Tender to palpation laterally in the parascapular region.  Negative Spurling's of the neck today.  Osteopathic findings C2 flexed rotated and side bent right C6 flexed rotated and side bent left T9 extended rotated and side bent left L2 flexed rotated and side bent right Sacrum right on right     Impression and Recommendations:     This case required medical decision making of moderate complexity. The above documentation has been reviewed and is accurate and complete [name], DO       Note: This dictation was prepared with Dragon dictation along with smaller phrase technology. Any transcriptional errors that result from this process are unintentional.

## 2019-09-06 NOTE — Assessment & Plan Note (Signed)
Patient does have a exacerbation of her chronic illness.  Has been doing relatively well.  Secondary to Covid and social determinants unable to do formal physical therapy at the moment.  Discussed with patient continue the posture and ergonomics, discussed which activities to do which wants to avoid.  Patient should increase activity as tolerated.  Follow-up again 6 weeks

## 2019-09-06 NOTE — Patient Instructions (Signed)
Keep working on posture and exercises Making progress See me in 6 weeks

## 2019-09-06 NOTE — Assessment & Plan Note (Signed)
Decision today to treat with OMT was based on Physical Exam  After verbal consent patient was treated with HVLA, ME, FPR techniques in cervical, thoracic, lumbar and sacral areas  Patient tolerated the procedure well with improvement in symptoms  Patient given exercises, stretches and lifestyle modifications  See medications in patient instructions if given  Patient will follow up in 6 weeks

## 2019-09-15 DIAGNOSIS — M79642 Pain in left hand: Secondary | ICD-10-CM | POA: Diagnosis not present

## 2019-09-15 DIAGNOSIS — M1811 Unilateral primary osteoarthritis of first carpometacarpal joint, right hand: Secondary | ICD-10-CM | POA: Diagnosis not present

## 2019-09-15 DIAGNOSIS — M1812 Unilateral primary osteoarthritis of first carpometacarpal joint, left hand: Secondary | ICD-10-CM | POA: Diagnosis not present

## 2019-09-15 DIAGNOSIS — M180 Bilateral primary osteoarthritis of first carpometacarpal joints: Secondary | ICD-10-CM | POA: Diagnosis not present

## 2019-09-19 MED FILL — AIMOVIG 140 MG/ML SOAJ: 140 | 28 days supply | Qty: 1 | Fill #10

## 2019-09-26 MED FILL — ELETRIPTAN HBR 40 MG TABLET: 40 | 30 days supply | Qty: 24 | Fill #3

## 2019-10-07 MED FILL — LEVOTHYROXINE SODIUM 100 MC: 100 | 90 days supply | Qty: 90 | Fill #3

## 2019-10-11 MED FILL — AIMOVIG 140 MG/ML SOAJ: 140 | 28 days supply | Qty: 1 | Fill #11

## 2019-10-19 ENCOUNTER — Ambulatory Visit: Payer: 59 | Admitting: Family Medicine

## 2019-10-24 MED FILL — ELETRIPTAN HBR 40 MG TABLET: 40 | 30 days supply | Qty: 24 | Fill #4

## 2019-10-28 ENCOUNTER — Encounter: Payer: Self-pay | Admitting: Family Medicine

## 2019-10-28 ENCOUNTER — Ambulatory Visit: Payer: 59 | Admitting: Family Medicine

## 2019-10-28 ENCOUNTER — Other Ambulatory Visit: Payer: Self-pay

## 2019-10-28 VITALS — BP 130/82 | HR 81 | Ht 67.0 in | Wt 183.0 lb

## 2019-10-28 DIAGNOSIS — G4486 Cervicogenic headache: Secondary | ICD-10-CM

## 2019-10-28 DIAGNOSIS — R519 Headache, unspecified: Secondary | ICD-10-CM | POA: Diagnosis not present

## 2019-10-28 DIAGNOSIS — M999 Biomechanical lesion, unspecified: Secondary | ICD-10-CM | POA: Diagnosis not present

## 2019-10-28 NOTE — Assessment & Plan Note (Signed)
Decision today to treat with OMT was based on Physical Exam  After verbal consent patient was treated with HVLA, ME, FPR techniques in cervical, thoracic, rib,  areas  Patient tolerated the procedure well with improvement in symptoms  Patient given exercises, stretches and lifestyle modifications  See medications in patient instructions if given  Patient will follow up in 4-8 weeks 
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
## 2019-10-28 NOTE — Assessment & Plan Note (Addendum)
Chronic problem with mild exacerbation.  Do feel that the weather changes could be contributing to some of this.  Discussed ergonomics, discussed proper eyewear when she is on the computer on a regular basis.  Spotting fairly well though to osteopathic manipulation.  Discussed medication management   And refilled prescription medications as per orders.  Follow-up again 4 to 8 weeks

## 2019-10-28 NOTE — Patient Instructions (Addendum)
Good to see you See me again in 4 weeks

## 2019-11-03 ENCOUNTER — Other Ambulatory Visit: Payer: Self-pay | Admitting: Neurology

## 2019-11-03 MED ORDER — GABAPENTIN 100 MG PO CAPS: 500.0000 mg | ORAL_CAPSULE | Freq: Every day | ORAL | 5 refills | Status: DC

## 2019-11-03 MED ORDER — GABAPENTIN 400 MG PO CAPS: 400.0000 mg | ORAL_CAPSULE | Freq: Every day | ORAL | 5 refills | Status: DC

## 2019-11-03 MED FILL — GABAPENTIN 100 MG CAPSULE: 100 | 30 days supply | Qty: 150 | Fill #0

## 2019-11-03 MED FILL — GABAPENTIN 400 MG CAPSULE: 400 | 30 days supply | Qty: 30 | Fill #0

## 2019-11-05 ENCOUNTER — Other Ambulatory Visit: Payer: Self-pay | Admitting: Neurology

## 2019-11-07 ENCOUNTER — Other Ambulatory Visit: Payer: Self-pay | Admitting: Neurology

## 2019-11-07 MED FILL — AIMOVIG 140 MG/ML SOAJ: 140 | 28 days supply | Qty: 1 | Fill #0

## 2019-11-21 ENCOUNTER — Other Ambulatory Visit: Payer: Self-pay | Admitting: Neurology

## 2019-11-21 MED FILL — ELETRIPTAN HBR 40 MG TABLET: 40 | 30 days supply | Qty: 24 | Fill #0
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
## 2019-11-22 ENCOUNTER — Ambulatory Visit: Payer: 59 | Admitting: Neurology

## 2019-11-22 ENCOUNTER — Other Ambulatory Visit: Payer: Self-pay

## 2019-11-22 ENCOUNTER — Encounter: Payer: Self-pay | Admitting: Neurology

## 2019-11-22 ENCOUNTER — Encounter: Payer: Self-pay | Admitting: *Deleted

## 2019-11-22 VITALS — BP 145/85 | HR 76 | Ht 67.0 in | Wt 180.0 lb

## 2019-11-22 DIAGNOSIS — M542 Cervicalgia: Secondary | ICD-10-CM

## 2019-11-22 DIAGNOSIS — G43709 Chronic migraine without aura, not intractable, without status migrainosus: Secondary | ICD-10-CM | POA: Diagnosis not present

## 2019-11-22 DIAGNOSIS — R519 Headache, unspecified: Secondary | ICD-10-CM

## 2019-11-22 DIAGNOSIS — G4486 Cervicogenic headache: Secondary | ICD-10-CM

## 2019-11-22 MED ORDER — EMGALITY 120 MG/ML ~~LOC~~ SOAJ: 120.0000 mg | SUBCUTANEOUS | 11 refills | Status: DC

## 2019-11-22 NOTE — Progress Notes (Addendum)
Ledora Bottcher Key: [id number] - [place] Case ID: [id number] help? Call us at [phone] Outcome Approvedon April 15 The request has been approved. The authorization is effective for a maximum of 1 fills from 11/24/2019 to 12/23/2019, as long as the member is enrolled in their current health plan. The request was reviewed and approved by a licensed clinical pharmacist. The request is approved for up to 52mL (two 120mg /mL pens) per 28 days for 1 fill followed by a proactive authorization for Emgality for 5 months with a quantity limit of 61mL (one 120MG /mL pen) per 28 days for 5 fills; effective 12/16/2019 - 05/16/2020. Please reference authorization [id number]. Renewal requires that you have experienced a reduction in migraine or headache frequency of at least 2 days per month OR you have experienced a reduction in migraine severity OR migraine duration with Emgality therapy. A written notification letter will follow with additional details.

## 2019-11-22 NOTE — Patient Instructions (Signed)
1.  We will stop Aimovig.  Instead, start Emgality.  First dose is 2 injections but then just 1 injection every 28 days. 2.  Relpax for rescue therapy but Limit use of pain relievers to no more than 2 days out of week to prevent risk of rebound or medication-overuse headache. 3.  Continue gabapentin 500mg  at bedtime 4.  Follow up in 4 months.

## 2019-11-28 ENCOUNTER — Ambulatory Visit: Payer: 59 | Admitting: Family Medicine

## 2019-11-28 ENCOUNTER — Telehealth: Payer: Self-pay | Admitting: Neurology

## 2019-11-28 ENCOUNTER — Other Ambulatory Visit: Payer: Self-pay

## 2019-11-28 ENCOUNTER — Encounter: Payer: Self-pay | Admitting: Family Medicine

## 2019-11-28 VITALS — BP 102/70 | HR 68 | Ht 67.0 in | Wt 181.0 lb

## 2019-11-28 DIAGNOSIS — G4486 Cervicogenic headache: Secondary | ICD-10-CM

## 2019-11-28 DIAGNOSIS — M999 Biomechanical lesion, unspecified: Secondary | ICD-10-CM | POA: Diagnosis not present

## 2019-11-28 DIAGNOSIS — R519 Headache, unspecified: Secondary | ICD-10-CM

## 2019-11-28 NOTE — Telephone Encounter (Signed)
Patient called to ask if she should continue to take the [place] since she knows how to take the medication correctly or if she should switch to the emgality. Patient states she thinks the aimovig is working for her now and would like to continue taking it for the next month. Please call.

## 2019-11-28 NOTE — Telephone Encounter (Signed)
If she would prefer to continue Aimovig for now, then that is okay.  She always has the option to switch.

## 2019-11-28 NOTE — Patient Instructions (Addendum)
Good to see you See me again in 6-7 weeks 

## 2019-11-28 NOTE — Telephone Encounter (Signed)
Called pt and left a voice mail per DPR  continue Aimovig for now if later on she would like to switch she can

## 2019-11-28 NOTE — Assessment & Plan Note (Signed)
Exacerbation of underlying problem.  Patient notes doing relatively well with the gabapentin.  Patient is still working with her neurologist with the other medications to see if they can get anything that is beneficial and help with some of the breakthrough pain.  Discussed icing regimen and home exercise, which activities to do which wants to avoid.  Patient will increase activity slowly over the course the next several weeks.  Follow-up again in 4 to 8 weeks.

## 2019-11-28 NOTE — Assessment & Plan Note (Signed)
   Decision today to treat with OMT was based on Physical Exam  After verbal consent patient was treated with HVLA, ME, FPR techniques in cervical, thoracic,  lumbar and sacral areas, all areas are chronic   Patient tolerated the procedure well with improvement in symptoms  Patient given exercises, stretches and lifestyle modifications  See medications in patient instructions if given  Patient will follow up in 4-8 weeks 

## 2019-11-28 NOTE — Progress Notes (Signed)
[facility] [address] [place] Phone: [phone] Subjective:   I [name] am serving as a Education administrator for Dr. Hulan Saas.  This visit occurred during the SARS-CoV-2 public health emergency.  Safety protocols were in place, including screening questions prior to the visit, additional usage of staff PPE, and extensive cleaning of exam room while observing appropriate contact time as indicated for disinfecting solutions.   I'm seeing this patient by the request  of:  Lawerance Cruel, MD  CC: Neck pain and headache follow-up  [id number]  [name] is a 54 y.o. female coming in with complaint of back pain. Last seen 10/28/2019 for OMT. Patient states she is doing well.  Patient has had some headaches from time to time.  Patient has been working with her neurologist and they are considering changing her prophylactic medication.  Patient states though that since she is actually decreased frequency of one of her medications she seems to be doing relatively better.  Patient is wondering if she should make that switch.  Has responded fairly well to manipulation previously.     Past Medical History:  Diagnosis Date  . History of ectopic pregnancy    04/ 2007  S/P LEFT SALPINGECTOMY  . History of kidney stones   . Hypothyroidism   . Menorrhagia   . Migraine    Past Surgical History:  Procedure Laterality Date  . CARPAL TUNNEL RELEASE Left 12/05/2017   Procedure: OPEN REDUCTION INTERNAL FIXATION (ORIF) WRIST FRACTURE WITH CARPAL TUNNEL RELEASE;  Surgeon: Roseanne Kaufman, MD;  Location: [place];  Service: Orthopedics;  Laterality: Left;  . DILATATION & CURETTAGE/HYSTEROSCOPY WITH MYOSURE  2016  . DILATATION & CURETTAGE/HYSTEROSCOPY WITH MYOSURE N/A 10/23/2016   Procedure: DILATATION & CURETTAGE/HYSTEROSCOPY;  Surgeon: Everlene Farrier, MD;  Location: [place];  Service: Gynecology;  Laterality: N/A;  . DILATION AND  EVACUATION  10/07/2007   retained poc  . LAPAROSCOPIC UNILATERAL SALPINGECTOMY Left 11/24/2005   Social History   Socioeconomic History  . Marital status: Widowed    Spouse name: Not on file  . Number of children: 0  . Years of education: 16  . Highest education level: Bachelor's degree (e.g., BA, AB, BS)  Occupational History  . Occupation: [employer]  Tobacco Use  . Smoking status: Never Smoker  . Smokeless tobacco: Never Used  Substance and Sexual Activity  . Alcohol use: No  . Drug use: No  . Sexual activity: Not on file  Other Topics Concern  . Not on file  Social History Narrative   Lives at home alone   Right-handed   Caffeine: weekly   No children   widow   Social Determinants of Health   Financial Resource Strain:   . Difficulty of Paying Living Expenses:   Food Insecurity:   . Worried About Charity fundraiser in the Last Year:   . Arboriculturist in the Last Year:   Transportation Needs:   . Film/video editor (Medical):   Marland Kitchen Lack of Transportation (Non-Medical):   Physical Activity:   . Days of Exercise per Week:   . Minutes of Exercise per Session:   Stress:   . Feeling of Stress :   Social Connections:   . Frequency of Communication with Friends and Family:   . Frequency of Social Gatherings with Friends and Family:   . Attends Religious Services:   . Active Member of Clubs or Organizations:   .  Attends Archivist Meetings:   Marland Kitchen Marital Status:    No Known Allergies Family History  Problem Relation Age of Onset  . Hypertension Mother   . Aortic aneurysm Mother   . Liver cancer Father   . Prostate cancer Brother   . Migraines Sister     Current Outpatient Medications (Endocrine & Metabolic):  .  levothyroxine (SYNTHROID) 100 MCG tablet, Take 100 mcg by mouth daily before breakfast.    Current Outpatient Medications (Analgesics):  .  eletriptan (RELPAX) 40 MG tablet, TAKE 1 TABLET BY MOUTH EVERY 2 HOURS AS  NEEDED FOR MIGRAINE OR HEADACHE. MAY REPEAT IN 2 HOURS AS NEEDED. **MAX OF 2 TABLETS IN 24 HOURS** .  Galcanezumab-gnlm (EMGALITY) 120 MG/ML SOAJ, Inject 120 mg into the skin every 28 (twenty-eight) days. .  Ibuprofen (ADVIL MIGRAINE) 200 MG CAPS, Take by mouth daily as needed.  Current Outpatient Medications (Hematological):  .  cyanocobalamin 500 MCG tablet, Take 400 mcg by mouth daily.  Current Outpatient Medications (Other):  .  cholecalciferol (VITAMIN D) 1000 units tablet, Take 1,000 Units by mouth daily. Marland Kitchen  co-enzyme Q-10 30 MG capsule, Take 100 mg by mouth 3 (three) times daily. .  Diclofenac Sodium (PENNSAID) 2 % SOLN, Place 2 application onto the skin 2 (two) times daily. Marland Kitchen  gabapentin (NEURONTIN) 100 MG capsule, Take 5 capsules (500 mg total) by mouth at bedtime. .  magnesium gluconate (MAGONATE) 500 MG tablet, Take 500 mg by mouth 2 (two) times daily. .  Multiple Vitamin (MULTIVITAMIN WITH MINERALS) TABS tablet, Take 1 tablet by mouth daily. .  Riboflavin (VITAMIN B-2 PO), Take 1 capsule by mouth. Marland Kitchen  Specialty Vitamins Products (MAGNESIUM, AMINO ACID CHELATE,) 133 MG tablet, Take 1 tablet by mouth 2 (two) times daily. .  Turmeric 500 MG CAPS, Take 500 mg by mouth 2 (two) times daily. .  Vitamin D, Ergocalciferol, (DRISDOL) 50000 units CAPS capsule, Take 1 capsule (50,000 Units total) by mouth every 7 (seven) days.   Reviewed prior external information including notes and imaging from  primary care provider As well as notes that were available from care everywhere and other healthcare systems.  Past medical history, social, surgical and family history all reviewed in electronic medical record.  No pertanent information unless stated regarding to the chief complaint.   Review of Systems:  No headache, visual changes, nausea, vomiting, diarrhea, constipation, dizziness, abdominal pain, skin rash, fevers, chills, night sweats, weight loss, swollen lymph nodes, body aches, joint  swelling, chest pain, shortness of breath, mood changes. POSITIVE muscle aches  Objective  Blood pressure 102/70, pulse 68, height 5\' 7"  (1.702 m), weight 181 lb (82.1 kg), SpO2 98 %.   General: No apparent distress alert and oriented x3 mood and affect normal, dressed appropriately.  HEENT: Pupils equal, extraocular movements intact  Respiratory: Patient's speak in full sentences and does not appear short of breath  Cardiovascular: No lower extremity edema, non tender, no erythema  Neuro: Cranial nerves II through XII are intact, neurovascularly intact in all extremities with 2+ DTRs and 2+ pulses.  Gait normal with good balance and coordination.  MSK:  tender with full range of motion and good stability and symmetric strength and tone of shoulders, elbows, wrist, hip, knee and ankles bilaterally.  Neck exam does have loss of lordosis.  Patient has negative Spurling's.  Significant tightness noted in the parascapular region of the neck bilaterally in the upper back.  5-5 strength of the upper extremities.  Deep  tendon reflexes are intact.  Osteopathic findings  C2 flexed rotated and side bent right C4 flexed rotated and side bent left T9 extended rotated and side bent left L2 flexed rotated and side bent right Sacrum right on right    Impression and Recommendations:     This case required medical decision making of moderate complexity. The above documentation has been reviewed and is accurate and complete [name], DO       Note: This dictation was prepared with Dragon dictation along with smaller phrase technology. Any transcriptional errors that result from this process are unintentional.

## 2019-11-29 ENCOUNTER — Encounter: Payer: Self-pay | Admitting: Family Medicine

## 2019-11-29 DIAGNOSIS — H524 Presbyopia: Secondary | ICD-10-CM | POA: Diagnosis not present

## 2019-11-29 NOTE — Progress Notes (Signed)
[place] ? [place] [address] ? [phone] ? [email or web address] APPROVAL  11/24/2019  Tustin Anita, [place] [id number] Member DOB: 23-Sep-1965 Prior Authorization Reference Number: [id number] Plan Name: [place] Code: PHI22 RE: Prior Authorization Approval [employer] is a [employer] company that provides services to  members of [place]. Certain prescription drugs or services require prior  authorization before they can be covered by your benefit plan. When this occurs, your  healthcare practitioner will contact [employer] to request prior authorization. This letter is to notify you that your prior authorization request for [facility] PEN 120  MG/ML has been approved based upon the information we received from your healthcare  practitioner.  Benefits for all services are subject to terms, conditions and eligibility as outlined in the  benefit documentation in effect at the time services are provided. The authorization is effective for a maximum of 1 fill(s) from 11/24/2019 to 12/23/2019, as  long as you are enrolled as a member of your current health plan. The request was reviewed and approved by a licensed clinical pharmacist. The request is approved for up to 27mL (two 120mg /mL pens) per 28 days for 1 fill followed by a proactive authorization for Emgality for 5 months with a quantity limit of 19mL (one  120MG /mL pen) per 28 days for 5 fills; effective 12/16/2019 - 05/16/2020. Please reference  authorization [id number]. Renewal requires that you have experienced a reduction in migraine or headache frequency  of at least 2 days per month OR you have experienced a reduction in migraine severity OR  migraine duration with Emgality therapy. Should you require additional medication beyond the approved quantities and dates, your  healthcare practitioner must submit an additional prior authorization request upon  expiration  of this approval. Sincerely, This letter may contain confidential individually identifiable health information protected under the [place] and other statutes.  [place] ? [place] [address] ? [phone] ? [email or web address] APPROVAL  Prior Authorization Department cc: ADAM JAFFE Fax: [phone]

## 2019-12-01 MED FILL — GABAPENTIN 400 MG CAPSULE: 400 | 30 days supply | Qty: 30 | Fill #1

## 2019-12-01 MED FILL — AIMOVIG 140 MG/ML SOAJ: 140 | 28 days supply | Qty: 1 | Fill #1

## 2019-12-31 MED FILL — AIMOVIG 140 MG/ML SOAJ: 140 | 28 days supply | Qty: 1 | Fill #2

## 2019-12-31 MED FILL — LEVOTHYROXINE SODIUM 100 MC: 100 | 90 days supply | Qty: 90 | Fill #4

## 2020-01-11 ENCOUNTER — Other Ambulatory Visit: Payer: Self-pay

## 2020-01-11 ENCOUNTER — Encounter: Payer: Self-pay | Admitting: Family Medicine

## 2020-01-11 ENCOUNTER — Ambulatory Visit: Payer: 59 | Admitting: Family Medicine

## 2020-01-11 VITALS — BP 108/72 | HR 71 | Ht 67.0 in | Wt 179.0 lb

## 2020-01-11 DIAGNOSIS — R519 Headache, unspecified: Secondary | ICD-10-CM | POA: Diagnosis not present

## 2020-01-11 DIAGNOSIS — M999 Biomechanical lesion, unspecified: Secondary | ICD-10-CM | POA: Diagnosis not present

## 2020-01-11 DIAGNOSIS — G4486 Cervicogenic headache: Secondary | ICD-10-CM

## 2020-01-11 NOTE — Patient Instructions (Signed)
Quarter teaspoon of the calm So happy you are doing much better See me in 2-3 months

## 2020-01-11 NOTE — Progress Notes (Signed)
Mallory [name] Mallory [name] Phone: [phone] Subjective:   Mallory [name], am serving as a scribe for Dr. Hulan Saas. This visit occurred during the SARS-CoV-2 public health emergency.  Safety protocols were in place, including screening questions prior to the visit, additional usage of staff PPE, and extensive cleaning of exam room while observing appropriate contact time as indicated for disinfecting solutions.   I'm seeing this patient by the request  of:  Mallory Cruel, MD  CC: Neck pain and headache follow-up  [id number]  Mallory [name] is a 54 y.o. female coming in with complaint of back and neck pain. Last seen on 11/28/2019. Patient states that she does not have headaches as frequently.   Medications patient has been prescribed: gabapentin     g:       Reviewed prior external information including notes and imaging from previsou exam, outside providers and external EMR if available.   As well as notes that were available from care everywhere and other healthcare systems.  Past medical history, social, surgical and family history all reviewed in electronic medical record.  [name] pertanent information unless stated regarding to the chief complaint.   Past Medical History:  Diagnosis Date  . History of ectopic pregnancy    04/ 2007  S/P LEFT SALPINGECTOMY  . History of kidney stones   . Hypothyroidism   . Menorrhagia   . Migraine     [name] Known Allergies [name] known drug allergies  Review of Systems:  [name] headache, visual changes, nausea, vomiting, diarrhea, constipation, dizziness, abdominal pain, skin rash, fevers, chills, night sweats, weight loss, swollen lymph nodes, body aches, joint swelling, chest pain, shortness of breath, mood changes. POSITIVE muscle aches  Objective  Blood pressure 108/72, pulse 71, height 5\' 7"  (1.702 m), weight 179 lb (81.2 kg), SpO2 99 %.   General: [name] apparent distress  alert and oriented x3 mood and affect normal, dressed appropriately.  HEENT: Pupils equal, extraocular movements intact  Respiratory: Patient's speak in full sentences and does not appear short of breath  Cardiovascular: [name] lower extremity edema, non tender, [name] erythema  Neuro: Cranial nerves II through XII are intact, neurovascularly intact in all extremities with 2+ DTRs and 2+ pulses.  Gait normal with good balance and coordination.  MSK:  Non tender with full range of motion and good stability and symmetric strength and tone of shoulders, elbows, wrist, hip, knee and ankles bilaterally.  Back - Normal skin, Spine with normal alignment and [name] deformity.  [name] tenderness to vertebral process palpation.  Paraspinous muscles are not tender and without spasm.   Range of motion is full at neck and lumbar sacral regions  Osteopathic findings  C2 flexed rotated and side bent right C6 flexed rotated and side bent left T3 extended rotated and side bent right inhaled rib T5 extended rotated and side bent left L2 flexed rotated and side bent right Sacrum right on right        Assessment and Plan:  Cervicogenic headache Improvement. Discussed hep, discussed which activities to do  RTC in 4-6 weeks     Nonallopathic problems  Decision today to treat with OMT was based on Physical Exam  After verbal consent patient was treated with HVLA, ME, FPR techniques in cervical, rib, thoracic, lumbar, and sacral  areas  Patient tolerated the procedure well with improvement in symptoms  Patient given exercises, stretches and lifestyle modifications  See medications in patient  instructions if given  Patient will follow up in 4-8 weeks      The above documentation has been reviewed and is accurate and complete Mallory Pulley, DO       Note: This dictation was prepared with Dragon dictation along with smaller phrase technology. Any transcriptional errors that result from this process are  unintentional.

## 2020-01-11 NOTE — Assessment & Plan Note (Signed)
Improvement. Discussed hep, discussed which activities to do  RTC in 4-6 weeks

## 2020-01-23 MED FILL — AIMOVIG 140 MG/ML SOAJ: 140 | 28 days supply | Qty: 1 | Fill #3

## 2020-01-24 DIAGNOSIS — Z0000 Encounter for general adult medical examination without abnormal findings: Secondary | ICD-10-CM | POA: Diagnosis not present

## 2020-01-24 DIAGNOSIS — E7800 Pure hypercholesterolemia, unspecified: Secondary | ICD-10-CM | POA: Diagnosis not present

## 2020-01-24 DIAGNOSIS — E039 Hypothyroidism, unspecified: Secondary | ICD-10-CM | POA: Diagnosis not present

## 2020-01-27 ENCOUNTER — Other Ambulatory Visit (HOSPITAL_COMMUNITY): Payer: Self-pay | Admitting: Family Medicine

## 2020-01-27 DIAGNOSIS — E7800 Pure hypercholesterolemia, unspecified: Secondary | ICD-10-CM | POA: Diagnosis not present

## 2020-01-27 DIAGNOSIS — E559 Vitamin D deficiency, unspecified: Secondary | ICD-10-CM | POA: Diagnosis not present

## 2020-01-27 DIAGNOSIS — Z0000 Encounter for general adult medical examination without abnormal findings: Secondary | ICD-10-CM | POA: Diagnosis not present

## 2020-01-27 DIAGNOSIS — G479 Sleep disorder, unspecified: Secondary | ICD-10-CM | POA: Diagnosis not present

## 2020-01-27 DIAGNOSIS — E039 Hypothyroidism, unspecified: Secondary | ICD-10-CM | POA: Diagnosis not present

## 2020-01-27 DIAGNOSIS — Z23 Encounter for immunization: Secondary | ICD-10-CM | POA: Diagnosis not present

## 2020-01-31 MED FILL — GABAPENTIN 100 MG CAPSULE: 100 | 30 days supply | Qty: 150 | Fill #1

## 2020-01-31 MED FILL — GABAPENTIN 400 MG CAPSULE: 400 | 30 days supply | Qty: 30 | Fill #3

## 2020-02-01 MED FILL — AIMOVIG 140 MG/ML SOAJ: 140 | 28 days supply | Qty: 1 | Fill #3

## 2020-02-09 MED FILL — ELETRIPTAN HYDROBROMIDE 40: 40 | 30 days supply | Qty: 24 | Fill #1

## 2020-02-25 MED FILL — AIMOVIG 140 MG/ML SOAJ: 140 | 28 days supply | Qty: 1 | Fill #4

## 2020-02-27 MED FILL — GABAPENTIN 400 MG CAPSULE: 400 | 30 days supply | Qty: 30 | Fill #4

## 2020-03-09 MED FILL — SERTRALINE HCL 50 MG TABS: 50 | 30 days supply | Qty: 30 | Fill #0

## 2020-03-09 MED FILL — LORazepam 0.5 MG TABS: 0.5 | 23 days supply | Qty: 90 | Fill #0

## 2020-03-13 ENCOUNTER — Ambulatory Visit: Payer: 59 | Admitting: Family Medicine

## 2020-03-19 MED FILL — AIMOVIG 140 MG/ML SOAJ: 140 | 28 days supply | Qty: 1 | Fill #5

## 2020-03-22 MED FILL — GABAPENTIN 400 MG CAPSULE: 400 | 30 days supply | Qty: 30 | Fill #5

## 2020-03-27 ENCOUNTER — Encounter: Payer: Self-pay | Admitting: Family Medicine

## 2020-03-27 ENCOUNTER — Ambulatory Visit: Payer: 59 | Admitting: Family Medicine

## 2020-03-27 ENCOUNTER — Other Ambulatory Visit: Payer: Self-pay

## 2020-03-27 VITALS — BP 120/72 | HR 69 | Ht 67.0 in | Wt 179.0 lb

## 2020-03-27 DIAGNOSIS — R519 Headache, unspecified: Secondary | ICD-10-CM | POA: Diagnosis not present

## 2020-03-27 DIAGNOSIS — G4486 Cervicogenic headache: Secondary | ICD-10-CM

## 2020-03-27 DIAGNOSIS — M999 Biomechanical lesion, unspecified: Secondary | ICD-10-CM

## 2020-03-27 NOTE — Patient Instructions (Signed)
Doing great enjoy the [place]! See me again in 10-12 weeks

## 2020-03-27 NOTE — Progress Notes (Signed)
[place] Phone: [phone] Subjective:   [name], am serving as a scribe for Dr. Hulan Saas. This visit occurred during the SARS-CoV-2 public health emergency.  Safety protocols were in place, including screening questions prior to the visit, additional usage of staff PPE, and extensive cleaning of exam room while observing appropriate contact time as indicated for disinfecting solutions.   I'm seeing this patient by the request  of:  Lawerance Cruel, MD  CC: Back and neck pain follow-up  [id number]  [name] is a 54 y.o. female coming in with complaint of back and neck pain. OMT 01/11/2020. Patient states   Medications patient has been prescribed: Ibuprofen  Taking: Intermittently but overall doing well         Reviewed prior external information including notes and imaging from previsou exam, outside providers and external EMR if available.   As well as notes that were available from care everywhere and other healthcare systems.  Past medical history, social, surgical and family history all reviewed in electronic medical record.  No pertanent information unless stated regarding to the chief complaint.   Past Medical History:  Diagnosis Date  . History of ectopic pregnancy    04/ 2007  S/P LEFT SALPINGECTOMY  . History of kidney stones   . Hypothyroidism   . Menorrhagia   . Migraine     No Known Allergies   Review of Systems:  No  visual changes, nausea, vomiting, diarrhea, constipation, dizziness, abdominal pain, skin rash, fevers, chills, night sweats, weight loss, swollen lymph nodes, body aches, joint swelling, chest pain, shortness of breath, mood changes. POSITIVE muscle aches, headache bilaterally  Objective  There were no vitals taken for this visit.   General: No apparent distress alert and oriented x3 mood and affect normal, dressed appropriately.  HEENT: Pupils  equal, extraocular movements intact  Respiratory: Patient's speak in full sentences and does not appear short of breath  Cardiovascular: No lower extremity edema, non tender, no erythema  Neuro: Cranial nerves II through XII are intact, neurovascularly intact in all extremities with 2+ DTRs and 2+ pulses.  Gait normal with good balance and coordination.  MSK:  Non tender with full range of motion and good stability and symmetric strength and tone of shoulders, elbows, wrist, hip, knee and ankles bilaterally.  Back - Normal skin, Spine with normal alignment and no deformity.  No tenderness to vertebral process palpation.  Paraspinous muscles are not tender and without spasm.   Range of motion is full at neck and lumbar sacral regions  Osteopathic findings  C2 flexed rotated and side bent right C6 flexed rotated and side bent left T9 extended rotated and side bent left L5 flexed rotated and side bent left       Assessment and Plan: Cervicogenic headache Patient is doing significantly better at this time.  We discussed with patient's that at this point I do feel she is stable and we will increase the duration between office visits to 10 to 12 weeks.     Nonallopathic problems  Decision today to treat with OMT was based on Physical Exam  After verbal consent patient was treated with HVLA, ME, FPR techniques in thoracic, lumbar areas  Patient tolerated the procedure well with improvement in symptoms  Patient given exercises, stretches and lifestyle modifications  See medications in patient instructions if given  Patient will follow up in 4-8 weeks  The above documentation has been reviewed and is accurate and complete [name], DO       Note: This dictation was prepared with Dragon dictation along with smaller phrase technology. Any transcriptional errors that result from this process are unintentional.

## 2020-03-27 NOTE — Assessment & Plan Note (Signed)
Patient is doing significantly better at this time.  We discussed with patient's that at this point I do feel she is stable and we will increase the duration between office visits to 10 to 12 weeks.

## 2020-04-01 ENCOUNTER — Telehealth: Payer: 59 | Admitting: Family

## 2020-04-01 DIAGNOSIS — L255 Unspecified contact dermatitis due to plants, except food: Secondary | ICD-10-CM

## 2020-04-01 MED ORDER — PREDNISONE 10 MG (21) PO TBPK: ORAL_TABLET | ORAL | 0 refills | Status: DC

## 2020-04-01 MED ORDER — DOXYCYCLINE HYCLATE 100 MG PO TABS: 100.0000 mg | ORAL_TABLET | Freq: Two times a day (BID) | ORAL | 0 refills | Status: DC

## 2020-04-01 NOTE — Progress Notes (Signed)
We are sorry that you are not feeing well.  Here is how we plan to help!  Based on what you have shared with me it looks like you have had an allergic reaction to the oily resin from a group of plants.  This resin is very sticky, so it easily attaches to your skin, clothing, tools equipment, and pet's fur.    This blistering rash is often called poison ivy rash although it can come from contact with the leaves, stems and roots of poison ivy, poison oak and poison sumac.  The oily resin contains urushiol (u-ROO-she-ol) that produces a skin rash on exposed skin.  The severity of the rash depends on the amount of urushiol that gets on your skin.  A section of skin with more urushiol on it may develop a rash sooner.  The rash usually develops 12-48 hours after exposure and can last two to three weeks.  Your skin must come in direct contact with the plant's oil to be affected.  Blister fluid doesn't spread the rash.  However, if you come into contact with a piece of clothing or pet fur that has urushiol on it, the rash may spread out.  You can also transfer the oil to other parts of your body with your fingers.  Often the rash looks like a straight line because of the way the plant brushes against your skin.  I suspect that a bacterial infection has developed at the rash site, and I have given you both an oral steroid and an oral antibiotic.  I have sent a prednisone dose pack to your chosen pharmacy. Be sure to follow the instructions carefully and complete the entire prescription.  You may use Benadryl or Caladryl topical lotions to sooth the itch and remember cool, not hot, showers and baths can help relieve the itching!  I have also sent in a prescription for an antibiotic: doxycycline 100 mg twice a day for 10 days.   Please schedule an appointment with your primary care provider to follow up on your skin infection within 2 weeks.  If drainage and red streaking continue to spread you should be seen  urgently.  Make sure that the clothes you were wearing and any towels or sheets that may have come in contact with the oil (urushiol) are washed in detergent and hot water.     What can you do to prevent this rash?  Avoid the plants.  Learn how to identify poison ivy, poison oak and poison sumac in all seasons.  When hiking or engaging in other activities that might expose you to these plants, try to stay on cleared pathways.  If camping, make sure you pitch your tent in an area free of these plants.  Keep pets from running through wooded areas so that urushiol doesn't accidentally stick to their fur, which you may touch.  Remove or kill the plants.  In your yard, you can get rid of poison ivy by applying an herbicide or pulling it out of the ground, including the roots, while wearing heavy gloves.  Afterward remove the gloves and thoroughly wash them and your hands.  Don't burn poison ivy or related plants because the urushiol can be carried by smoke.  Wear protective clothing.  If needed, protect your skin by wearing socks, boots, pants, long sleeves and vinyl gloves.  Wash your skin right away.  Washing off the oil with soap and water within 30 minutes of exposure may reduce your chances of  getting a poison ivy rash.  Even washing after an hour or so can help reduce the severity of the rash.  If you walk through some poison ivy and then later touch your shoes, you may get some urushiol on your hands, which may then transfer to your face or body by touching or rubbing.  If the contaminated object isn't cleaned, the urushiol on it can still cause a skin reaction years later.    Be careful not to reuse towels after you have washed your skin.  Also carefully wash clothing in detergent and hot water to remove all traces of the oil.  Handle contaminated clothing carefully so you don't transfer the urushiol to yourself, furniture, rugs or appliances.  Remember that pets can carry the oil on their fur  and paws.  If you think your pet may be contaminated with urushiol, put on some long rubber gloves and give your pet a bath.  Finally, be careful not to burn these plants as the smoke can contain traces of the oil.  Inhaling the smoke may result in difficulty breathing. If that occurred you should see a physician as soon as possible.  See your doctor right away if:   The reaction is severe or widespread  You inhaled the smoke from burning poison ivy and are having difficulty breathing  Your skin continues to swell  The rash affects your eyes, mouth or genitals  Blisters are oozing pus  You develop a fever greater than 100 F (37.8 C)  The rash doesn't get better within a few weeks.  If you scratch the poison ivy rash, bacteria under your fingernails may cause the skin to become infected.  See your doctor if pus starts oozing from the blisters.  Treatment generally includes antibiotics.  Poison ivy treatments are usually limited to self-care methods.  And the rash typically goes away on its own in two to three weeks.     If the rash is widespread or results in a large number of blisters, your doctor may prescribe an oral corticosteroid, such as prednisone.  If a bacterial infection has developed at the rash site, your doctor may give you a prescription for an oral antibiotic.  MAKE SURE YOU   Understand these instructions.  Will watch your condition.  Will get help right away if you are not doing well or get worse.  Thank you for choosing an e-visit. Your e-visit answers were reviewed by a board certified advanced clinical practitioner to complete your personal care plan. Depending upon the condition, your plan could have included both over the counter or prescription medications.  Please review your pharmacy choice. If there is a problem you may use MyChart messaging to have the prescription routed to another pharmacy.   Your safety is important to [place]. If you have drug  allergies check your prescription carefully.  You can use MyChart to ask questions about today's visit, request a non-urgent call back, or ask for a work or school excuse for 24 hours related to this e-Visit. If it has been greater than 24 hours you will need to follow up with your provider, or enter a new e-Visit to address those concerns.   You will get an email in the next two days asking about your experience. I hope that your e-visit has been valuable and will speed your recovery Thank you for choosing an e-visit.  Approximately 5 minutes was spent documenting and reviewing patient's chart.

## 2020-04-03 ENCOUNTER — Other Ambulatory Visit (HOSPITAL_COMMUNITY): Payer: Self-pay | Admitting: Obstetrics and Gynecology

## 2020-04-03 DIAGNOSIS — Z1231 Encounter for screening mammogram for malignant neoplasm of breast: Secondary | ICD-10-CM | POA: Diagnosis not present

## 2020-04-03 DIAGNOSIS — Z13820 Encounter for screening for osteoporosis: Secondary | ICD-10-CM | POA: Diagnosis not present

## 2020-04-03 DIAGNOSIS — Z01419 Encounter for gynecological examination (general) (routine) without abnormal findings: Secondary | ICD-10-CM | POA: Diagnosis not present

## 2020-04-03 MED FILL — ALENDRONATE NA 70 MG TAB: 70 | 28 days supply | Qty: 4 | Fill #0

## 2020-04-04 DIAGNOSIS — Z01419 Encounter for gynecological examination (general) (routine) without abnormal findings: Secondary | ICD-10-CM | POA: Diagnosis not present
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
## 2020-04-06 ENCOUNTER — Other Ambulatory Visit: Payer: Self-pay

## 2020-04-06 ENCOUNTER — Encounter: Payer: Self-pay | Admitting: Neurology

## 2020-04-06 ENCOUNTER — Ambulatory Visit: Payer: 59 | Admitting: Neurology

## 2020-04-06 VITALS — BP 137/77 | HR 69 | Ht 67.0 in | Wt 176.4 lb

## 2020-04-06 DIAGNOSIS — M542 Cervicalgia: Secondary | ICD-10-CM | POA: Diagnosis not present

## 2020-04-06 DIAGNOSIS — G43009 Migraine without aura, not intractable, without status migrainosus: Secondary | ICD-10-CM

## 2020-04-06 MED ORDER — ELETRIPTAN HYDROBROMIDE 40 MG PO TABS
ORAL_TABLET | ORAL | 1 refills | Status: DC
Start: 2020-04-06 — End: 2021-01-16

## 2020-04-06 MED FILL — ELETRIPTAN HYDROBROMIDE 40: 40 | 30 days supply | Qty: 24 | Fill #0

## 2020-04-06 NOTE — Patient Instructions (Signed)
  1. Aimovig 140mg  every 28 days 2. Take Relpax at earliest onset of headache.  May repeat dose once in 2 hours if needed.  Maximum 2 tablets in 24 hours. 3. Limit use of pain relievers to no more than 2 days out of the week.  These medications include acetaminophen, NSAIDs (ibuprofen/Advil/Motrin, naproxen/Aleve, triptans (Imitrex/sumatriptan), Excedrin, and narcotics.  This will help reduce risk of rebound headaches. 4. Be aware of common food triggers:  - Caffeine:  coffee, black tea, cola, Mt. Dew  - Chocolate  - Dairy:  aged cheeses (brie, blue, cheddar, gouda, [place], provolone, [place], Swiss, etc), chocolate milk, buttermilk, sour cream, limit eggs and yogurt  - Nuts, peanut butter  - Alcohol  - Cereals/grains:  FRESH breads (fresh bagels, sourdough, doughnuts), yeast productions  - Processed/canned/aged/cured meats (pre-packaged deli meats, hotdogs)  - MSG/glutamate:  soy sauce, flavor enhancer, pickled/preserved/marinated foods  - Sweeteners:  aspartame (Equal, Nutrasweet).  Sugar and Splenda are okay  - Vegetables:  legumes (lima beans, lentils, snow peas, fava beans, pinto peans, peas, garbanzo beans), sauerkraut, onions, olives, pickles  - Fruit:  avocados, bananas, citrus fruit (orange, lemon, grapefruit), mango  - Other:  Frozen meals, macaroni and cheese 5. Routine exercise 6. Stay adequately hydrated (aim for 64 oz water daily) 7. Keep headache diary 8. Maintain proper stress management 9. Maintain proper sleep hygiene 10. Do not skip meals 11. Consider supplements:  magnesium citrate 400mg  daily, riboflavin 400mg  daily, coenzyme Q10 100mg  three times daily.

## 2020-04-09 MED FILL — SERTRALINE HCL 50 MG TABLET: 50 | 30 days supply | Qty: 30 | Fill #1

## 2020-04-10 MED FILL — LORazepam 0.5 MG TABS: 0.5 | 23 days supply | Qty: 90 | Fill #1

## 2020-04-11 ENCOUNTER — Telehealth: Payer: Self-pay | Admitting: Neurology

## 2020-04-11 MED FILL — LEVOTHYROXINE SODIUM 100 MC: 100 | 84 days supply | Qty: 78 | Fill #0

## 2020-04-11 NOTE — Telephone Encounter (Signed)
Patient was in office on Friday. She would like to know if Dr [name] wants her to continue the Gabapentin? If so, please send in a refill to [facility]. Please call.

## 2020-04-12 ENCOUNTER — Telehealth: Payer: Self-pay | Admitting: Neurology

## 2020-04-12 NOTE — Telephone Encounter (Addendum)
Patient called and left a message returning [facility] call. Please see closed telephone note for more details. Patient is requesting refills on gabapentin 400 MG.  [name]

## 2020-04-12 NOTE — Telephone Encounter (Signed)
We made no changes to gabapentin at last visit.  She was prescribed gabapentin for her neck pain.  If she is doing well and would like to try stopping it, that is fine.  Otherwise, we can send prescription for refill with 5 additional refills.

## 2020-04-12 NOTE — Telephone Encounter (Signed)
Left a detailed vm for pt.

## 2020-04-13 ENCOUNTER — Other Ambulatory Visit: Payer: Self-pay

## 2020-04-13 MED ORDER — GABAPENTIN 100 MG PO CAPS: 100.0000 mg | ORAL_CAPSULE | Freq: Every day | ORAL | 5 refills | Status: DC

## 2020-04-13 MED ORDER — GABAPENTIN 400 MG PO CAPS: 400.0000 mg | ORAL_CAPSULE | Freq: Every day | ORAL | 5 refills | Status: DC

## 2020-04-13 NOTE — Telephone Encounter (Signed)
Refill sent to pt pharmacy 

## 2020-04-19 ENCOUNTER — Encounter: Payer: Self-pay | Admitting: Physician Assistant

## 2020-04-19 ENCOUNTER — Other Ambulatory Visit: Payer: Self-pay

## 2020-04-19 ENCOUNTER — Ambulatory Visit: Payer: 59 | Admitting: Physician Assistant

## 2020-04-19 DIAGNOSIS — D485 Neoplasm of uncertain behavior of skin: Secondary | ICD-10-CM | POA: Diagnosis not present

## 2020-04-19 DIAGNOSIS — Z808 Family history of malignant neoplasm of other organs or systems: Secondary | ICD-10-CM | POA: Diagnosis not present

## 2020-04-19 DIAGNOSIS — Z1283 Encounter for screening for malignant neoplasm of skin: Secondary | ICD-10-CM | POA: Diagnosis not present

## 2020-04-19 DIAGNOSIS — Z87898 Personal history of other specified conditions: Secondary | ICD-10-CM

## 2020-04-19 DIAGNOSIS — L080 Pyoderma: Secondary | ICD-10-CM | POA: Diagnosis not present

## 2020-04-19 DIAGNOSIS — Z86018 Personal history of other benign neoplasm: Secondary | ICD-10-CM | POA: Diagnosis not present

## 2020-04-19 NOTE — Progress Notes (Signed)
   Follow up Visit  Subjective  [name] is a 54 y.o. female who presents for the following: Annual Exam (skin check and check a spot on the nose (noticed for a couple months, believe mask is cause of the spot)). Spot on nose for a couple months. It is red but it is not sore. Almost looks like a blemish but hasn't cleared. Gets better when she is able to go without a mask but gets worse with mask. She has to wear a mask daily for work. Mom and sister have both had skin cancer. Pt has a history of 1 atypical mole.   Objective  Well appearing patient in no apparent distress; mood and affect are within normal limits.  A full examination was performed including scalp, head, eyes, ears, nose, lips, neck, chest, axillae, abdomen, back, buttocks, bilateral upper extremities, bilateral lower extremities, hands, feet, fingers, toes, fingernails, and toenails. All findings within normal limits unless otherwise noted below. No suspicious moles noted on back.   Objective  Mid Back: Full body skin exam  Objective  Right 4th Dorsal Mid Toe: Mild atypia - 12[birth year]02[birth year]2067  Objective  Left Nasal Sidewall: Pink papule     Assessment & Plan  Screening for malignant neoplasm of skin Mid Back  History of atypical nevus Right 4th Dorsal Mid Toe  Neoplasm of uncertain behavior of skin Left Nasal Sidewall  Skin / nail biopsy Type of biopsy: tangential   Informed consent: discussed and consent obtained   Timeout: patient name, date of birth, surgical site, and procedure verified   Procedure prep:  Patient was prepped and draped in usual sterile fashion (Non sterile) Prep type:  Chlorhexidine Anesthesia: the lesion was anesthetized in a standard fashion   Anesthetic:  1% lidocaine w/ epinephrine 1-100,000 local infiltration Instrument used: flexible razor blade   Outcome: patient tolerated procedure well   Post-procedure details: wound care instructions given    Specimen 1 - Surgical  pathology Differential Diagnosis: scc vs bcc Check Margins: No

## 2020-04-19 NOTE — Patient Instructions (Signed)

## 2020-04-21 MED FILL — AIMOVIG 140 MG/ML SOAJ: 140 | 28 days supply | Qty: 1 | Fill #6

## 2020-04-24 ENCOUNTER — Telehealth: Payer: Self-pay | Admitting: Physician Assistant

## 2020-04-24 NOTE — Telephone Encounter (Signed)
Pathology to patient.  °

## 2020-04-24 NOTE — Telephone Encounter (Signed)
Patient left message on office voice mail that she was calling to get pathology results from her last visit with Arlyss Gandy, PAC.

## 2020-04-27 ENCOUNTER — Other Ambulatory Visit (HOSPITAL_COMMUNITY): Payer: Self-pay | Admitting: Gastroenterology

## 2020-04-30 MED FILL — ALENDRONATE NA 70 MG TAB: 70 | 28 days supply | Qty: 4 | Fill #1

## 2020-04-30 MED FILL — GABAPENTIN 400 MG CAPSULE: 400 | 30 days supply | Qty: 30 | Fill #0

## 2020-05-04 MED FILL — SERTRALINE HCL 50 MG TABLET: 50 | 90 days supply | Qty: 135 | Fill #0

## 2020-05-04 MED FILL — LORazepam 0.5 MG TABS: 0.5 | 30 days supply | Qty: 120 | Fill #0

## 2020-05-11 DIAGNOSIS — Z23 Encounter for immunization: Secondary | ICD-10-CM | POA: Diagnosis not present

## 2020-05-17 ENCOUNTER — Encounter: Payer: Self-pay | Admitting: Neurology

## 2020-05-17 NOTE — Progress Notes (Addendum)
[facility] (Key: [id number]) Aimovig 140MG /ML auto-injectors   Form [employer] ePA Form 2017 NCPDP Created 12 hours ago Sent to Plan 29 minutes ago Plan Response 29 minutes ago Submit Clinical Questions 26 minutes ago Determination Favorable 25 minutes ago Message from Plan The request has been approved. The authorization is effective for a maximum of 12 fills from 05/17/2020 to 05/16/2021, as long as the member is enrolled in their current health plan. This has been approved for a quantity limit of 1.0 with a day supply limit of 30.0. A written notification letter will follow with additional details.

## 2020-05-18 MED FILL — AIMOVIG 140 MG/ML SOAJ: 140 | 28 days supply | Qty: 1 | Fill #7

## 2020-05-24 ENCOUNTER — Telehealth: Payer: 59 | Admitting: Emergency Medicine

## 2020-05-24 DIAGNOSIS — R300 Dysuria: Secondary | ICD-10-CM | POA: Diagnosis not present

## 2020-05-24 DIAGNOSIS — N898 Other specified noninflammatory disorders of vagina: Secondary | ICD-10-CM | POA: Diagnosis not present

## 2020-05-25 MED ORDER — NITROFURANTOIN MONOHYD MACRO 100 MG PO CAPS: 100.0000 mg | ORAL_CAPSULE | Freq: Two times a day (BID) | ORAL | 0 refills | Status: DC

## 2020-05-25 MED ORDER — FLUCONAZOLE 150 MG PO TABS: ORAL_TABLET | ORAL | 0 refills | Status: DC

## 2020-05-25 NOTE — Progress Notes (Signed)
We are sorry that you are not feeling well. Here is how we plan to help! Based on what you shared with me it looks like you: May have a yeast vaginosis  Vaginosis is an inflammation of the vagina that can result in discharge, itching and pain. The cause is usually a change in the normal balance of vaginal bacteria or an infection. Vaginosis can also result from reduced estrogen levels after menopause.  The most common causes of vaginosis are:   Bacterial vaginosis which results from an overgrowth of one on several organisms that are normally present in your vagina.   Yeast infections which are caused by a naturally occurring fungus called candida.   Vaginal atrophy (atrophic vaginosis) which results from the thinning of the vagina from reduced estrogen levels after menopause.   Trichomoniasis which is caused by a parasite and is commonly transmitted by sexual intercourse.  Factors that increase your risk of developing vaginosis include: Marland Kitchen Medications, such as antibiotics and steroids . Uncontrolled diabetes . Use of hygiene products such as bubble bath, vaginal spray or vaginal deodorant . Douching . Wearing damp or tight-fitting clothing . Using an intrauterine device (IUD) for birth control . Hormonal changes, such as those associated with pregnancy, birth control pills or menopause . Sexual activity . Having a sexually transmitted infection  Your treatment plan is A single Diflucan (fluconazole) 150mg  tablet once.  I have electronically sent this prescription into the pharmacy that you have chosen. You may repeat the dose at the end of your fourse of antibiotics for UTI.  It also appears you may have a urinary tract infection. I have prescribed Macrobid 500 mg twice daily for 5 days.    Be sure to take all of the medication as directed. Stop taking any medication if you develop a rash, tongue swelling or shortness of breath. Mothers who are breast feeding should consider pumping  and discarding their breast milk while on these antibiotics. However, there is no consensus that infant exposure at these doses would be harmful.  Remember that medication creams can weaken latex condoms. Marland Kitchen   HOME CARE:  Good hygiene may prevent some types of vaginosis from recurring and may relieve some symptoms:  . Avoid baths, hot tubs and whirlpool spas. Rinse soap from your outer genital area after a shower, and dry the area well to prevent irritation. Don't use scented or harsh soaps, such as those with deodorant or antibacterial action. Marland Kitchen Avoid irritants. These include scented tampons and pads. . Wipe from front to back after using the toilet. Doing so avoids spreading fecal bacteria to your vagina.  Other things that may help prevent vaginosis include:  Marland Kitchen Don't douche. Your vagina doesn't require cleansing other than normal bathing. Repetitive douching disrupts the normal organisms that reside in the vagina and can actually increase your risk of vaginal infection. Douching won't clear up a vaginal infection. . Use a latex condom. Both female and female latex condoms may help you avoid infections spread by sexual contact. . Wear cotton underwear. Also wear pantyhose with a cotton crotch. If you feel comfortable without it, skip wearing underwear to bed. Yeast thrives in [employer] Your symptoms should improve in the next day or two.  GET HELP RIGHT AWAY IF:  . You have pain in your lower abdomen ( pelvic area or over your ovaries) . You develop nausea or vomiting . You develop a fever . Your discharge changes or worsens . You have persistent pain with  intercourse . You develop shortness of breath, a rapid pulse, or you faint.  These symptoms could be signs of problems or infections that need to be evaluated by a medical provider now.  MAKE SURE YOU    Understand these instructions.  Will watch your condition.  Will get help right away if you are not doing well or  get worse.  Your e-visit answers were reviewed by a board certified advanced clinical practitioner to complete your personal care plan. Depending upon the condition, your plan could have included both over the counter or prescription medications. Please review your pharmacy choice to make sure that you have choses a pharmacy that is open for you to pick up any needed prescription, Your safety is important to [place]. If you have drug allergies check your prescription carefully.   You can use MyChart to ask questions about today's visit, request a non-urgent call back, or ask for a work or school excuse for 24 hours related to this e-Visit. If it has been greater than 24 hours you will need to follow up with your provider, or enter a new e-Visit to address those concerns. You will get a MyChart message within the next two days asking about your experience. I hope that your e-visit has been valuable and will speed your recovery.  **Please do not respond to this message unless you have follow up questions.** Greater than 5 but less than 10 minutes spent researching, coordinating, and implementing care for this patient today

## 2020-05-25 NOTE — Addendum Note (Signed)
Addended by: Evelina Dun A on: 05/25/2020 04:26 PM   Modules accepted: Orders

## 2020-05-25 NOTE — Addendum Note (Signed)
Addended by: Margarita Mail on: 05/25/2020 02:07 PM   Modules accepted: Orders

## 2020-05-25 NOTE — Addendum Note (Signed)
Addended by: Evelina Dun A on: 05/25/2020 04:35 PM   Modules accepted: Orders

## 2020-06-07 MED FILL — BISACODYL EC 5 MG TBEC: 5 | 1 days supply | Qty: 4 | Fill #0

## 2020-06-07 MED FILL — PEG-3350 SOLUTION: 420 | 1 days supply | Qty: 4000 | Fill #0

## 2020-06-07 MED FILL — GABAPENTIN 100 MG CAPSULE: 100 | 30 days supply | Qty: 30 | Fill #0

## 2020-06-12 ENCOUNTER — Ambulatory Visit: Payer: 59 | Admitting: Family Medicine

## 2020-06-12 DIAGNOSIS — Z1159 Encounter for screening for other viral diseases: Secondary | ICD-10-CM | POA: Diagnosis not present

## 2020-06-15 DIAGNOSIS — Z1211 Encounter for screening for malignant neoplasm of colon: Secondary | ICD-10-CM | POA: Diagnosis not present

## 2020-06-15 DIAGNOSIS — K5730 Diverticulosis of large intestine without perforation or abscess without bleeding: Secondary | ICD-10-CM | POA: Diagnosis not present

## 2020-06-18 MED FILL — LORazepam 0.5 MG TABS: 0.5 | 30 days supply | Qty: 120 | Fill #1

## 2020-06-18 MED FILL — AIMOVIG 140 MG/ML SOAJ: 140 | 28 days supply | Qty: 1 | Fill #8

## 2020-06-20 NOTE — Progress Notes (Deleted)
  [place] Phone: [phone] Subjective:    I'm seeing this patient by the request  of:  Lawerance Cruel, MD  CC:   [id number]  [name] is a 54 y.o. female coming in with complaint of back and neck pain. OMT 03/27/2020. Patient states   Medications patient has been prescribed: Vit D Taking:         Reviewed prior external information including notes and imaging from previsou exam, outside providers and external EMR if available.   As well as notes that were available from care everywhere and other healthcare systems.  Past medical history, social, surgical and family history all reviewed in electronic medical record.  No pertanent information unless stated regarding to the chief complaint.   Past Medical History:  Diagnosis Date  . History of ectopic pregnancy    04/ 2007  S/P LEFT SALPINGECTOMY  . History of kidney stones   . Hypothyroidism   . Menorrhagia   . Migraine     No Known Allergies   Review of Systems:  No headache, visual changes, nausea, vomiting, diarrhea, constipation, dizziness, abdominal pain, skin rash, fevers, chills, night sweats, weight loss, swollen lymph nodes, body aches, joint swelling, chest pain, shortness of breath, mood changes. POSITIVE muscle aches  Objective  There were no vitals taken for this visit.   General: No apparent distress alert and oriented x3 mood and affect normal, dressed appropriately.  HEENT: Pupils equal, extraocular movements intact  Respiratory: Patient's speak in full sentences and does not appear short of breath  Cardiovascular: No lower extremity edema, non tender, no erythema  Neuro: Cranial nerves II through XII are intact, neurovascularly intact in all extremities with 2+ DTRs and 2+ pulses.  Gait normal with good balance and coordination.  MSK:  Non tender with full range of motion and good stability and symmetric strength  and tone of shoulders, elbows, wrist, hip, knee and ankles bilaterally.  Back - Normal skin, Spine with normal alignment and no deformity.  No tenderness to vertebral process palpation.  Paraspinous muscles are not tender and without spasm.   Range of motion is full at neck and lumbar sacral regions  Osteopathic findings  C2 flexed rotated and side bent right C6 flexed rotated and side bent left T3 extended rotated and side bent right inhaled rib T9 extended rotated and side bent left L2 flexed rotated and side bent right Sacrum right on right       Assessment and Plan:    Nonallopathic problems  Decision today to treat with OMT was based on Physical Exam  After verbal consent patient was treated with HVLA, ME, FPR techniques in cervical, rib, thoracic, lumbar, and sacral  areas  Patient tolerated the procedure well with improvement in symptoms  Patient given exercises, stretches and lifestyle modifications  See medications in patient instructions if given  Patient will follow up in 4-8 weeks      The above documentation has been reviewed and is accurate and complete Jacqualin Combes       Note: This dictation was prepared with Dragon dictation along with smaller phrase technology. Any transcriptional errors that result from this process are unintentional.

## 2020-06-21 ENCOUNTER — Encounter: Payer: Self-pay | Admitting: Family Medicine

## 2020-06-21 ENCOUNTER — Ambulatory Visit: Payer: 59 | Admitting: Family Medicine

## 2020-06-21 ENCOUNTER — Other Ambulatory Visit: Payer: Self-pay

## 2020-06-21 VITALS — BP 112/82 | HR 66 | Ht 67.0 in | Wt 175.0 lb

## 2020-06-21 DIAGNOSIS — M999 Biomechanical lesion, unspecified: Secondary | ICD-10-CM | POA: Diagnosis not present

## 2020-06-21 DIAGNOSIS — G4486 Cervicogenic headache: Secondary | ICD-10-CM | POA: Diagnosis not present

## 2020-06-21 NOTE — Progress Notes (Signed)
[name]  Phone: [phone] Subjective:   [name], am serving as a scribe for Dr. Hulan Saas. This visit occurred during the SARS-CoV-2 public health emergency.  Safety protocols were in place, including screening questions prior to the visit, additional usage of staff PPE, and extensive cleaning of exam room while observing appropriate contact time as indicated for disinfecting solutions.   I'm seeing this patient by the request  of:  Lawerance Cruel, MD  CC: Neck pain follow-up  [id number]  [name] is a 54 y.o. female coming in with complaint of back and neck pain Patient states that she has been doing well that the manipulations has helped with the migraines.  Medications patient has been prescribed: Gabapentin  Taking: Yes         Reviewed prior external information including notes and imaging from previsou exam, outside providers and external EMR if available.   As well as notes that were available from care everywhere and other healthcare systems.  Past medical history, social, surgical and family history all reviewed in electronic medical record.  No pertanent information unless stated regarding to the chief complaint.   Past Medical History:  Diagnosis Date  . History of ectopic pregnancy    04/ 2007  S/P LEFT SALPINGECTOMY  . History of kidney stones   . Hypothyroidism   . Menorrhagia   . Migraine     No Known Allergies   Review of Systems:  No headache, visual changes, nausea, vomiting, diarrhea, constipation, dizziness, abdominal pain, skin rash, fevers, chills, night sweats, weight loss, swollen lymph nodes, body aches, joint swelling, chest pain, shortness of breath, mood changes. POSITIVE muscle aches  m headaches but improvingy Objective  Blood pressure 112/82, pulse 66, height 5\' 7"  (1.702 m), weight 175 lb (79.4 kg), SpO2 98 %.   General: No apparent  distress alert and oriented x3 mood and affect normal, dressed appropriately.  HEENT: Pupils equal, extraocular movements intact  Respiratory: Patient's speak in full sentences and does not appear short of breath  Cardiovascular: No lower extremity edema, non tender, no erythema  Neuro: Cranial nerves II through XII are intact, neurovascularly intact in all extremities with 2+ DTRs and 2+ pulses.  Gait normal with good balance and coordination.  MSK:  Non tender with full range of motion and good stability and symmetric strength and tone of shoulders, elbows, wrist, hip, knee and ankles bilaterally.  Back - Normal skin, Spine with normal alignment and no deformity.  No tenderness to vertebral process palpation.  Paraspinous muscles are not tender and without spasm.   Range of motion is full at neck and lumbar sacral regions  Osteopathic findings  C6 flexed rotated and side bent left rib T8 extended rotated and side bent left L2 flexed rotated and side bent right Sacrum right on right       Assessment and Plan:  Cervicogenic headache Chronic problem but overall fairly stable.  Patient continues the same medications including gabapentin and vitamin D.  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  Responding well to manipulation.  Follow-up again in 2 months    Nonallopathic problems  Decision today to treat with OMT was based on Physical Exam  After verbal consent patient was treated with HVLA, ME, FPR techniques in cervical, , thoracic, lumbar, and sacral  areas  Patient tolerated the procedure well with improvement in symptoms  Patient  given exercises, stretches and lifestyle modifications  See medications in patient instructions if given  Patient will follow up in 4-8 weeks      The above documentation has been reviewed and is accurate and complete [name], DO       Note: This dictation was prepared with Dragon dictation along  with smaller phrase technology. Any transcriptional errors that result from this process are unintentional.

## 2020-06-21 NOTE — Patient Instructions (Addendum)
Good to see you  So sorry for your loss I am so glad headaches are better See me again in 2-3 months

## 2020-06-21 NOTE — Assessment & Plan Note (Signed)
Chronic problem but overall fairly stable.  Patient continues the same medications including gabapentin and vitamin D.  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  Responding well to manipulation.  Follow-up again in 2 months

## 2020-06-25 MED FILL — GABAPENTIN 400 MG CAPSULE: 400 | 30 days supply | Qty: 30 | Fill #2

## 2020-06-25 MED FILL — ALENDRONATE NA 70 MG TAB: 70 | 28 days supply | Qty: 4 | Fill #3

## 2020-07-10 MED FILL — LEVOTHYROXINE SODIUM 100 MC: 100 | 84 days supply | Qty: 78 | Fill #1

## 2020-07-12 MED FILL — AIMOVIG 140 MG/ML SOAJ: 140 | 28 days supply | Qty: 1 | Fill #9

## 2020-07-23 MED FILL — GABAPENTIN 100 MG CAPSULE: 100 | 30 days supply | Qty: 30 | Fill #1

## 2020-07-23 MED FILL — ALENDRONATE NA 70 MG TAB: 70 | 28 days supply | Qty: 4 | Fill #4

## 2020-07-31 MED FILL — GABAPENTIN 400 MG CAPSULE: 400 | 30 days supply | Qty: 30 | Fill #3

## 2020-08-08 ENCOUNTER — Telehealth: Payer: 59 | Admitting: Emergency Medicine

## 2020-08-08 DIAGNOSIS — J029 Acute pharyngitis, unspecified: Secondary | ICD-10-CM

## 2020-08-08 NOTE — Progress Notes (Signed)
We are sorry that you are not feeling well.  Here is how we plan to help!  Your symptoms indicate a likely viral infection (Pharyngitis).   Pharyngitis is inflammation in the back of the throat which can cause a sore throat, scratchiness and sometimes difficulty swallowing.   Pharyngitis is typically caused by a respiratory virus and will just run its course.  Please keep in mind that your symptoms could last up to 10 days.  For throat pain, we recommend over the counter oral pain relief medications such as acetaminophen or aspirin, or anti-inflammatory medications such as ibuprofen or naproxen sodium.  Topical treatments such as oral throat lozenges or sprays may be used as needed.  Avoid close contact with loved ones, especially the very young and elderly.  Remember to wash your hands thoroughly throughout the day as this is the number one way to prevent the spread of infection and wipe down door knobs and counters with disinfectant.  After careful review of your answers, I would not recommend and antibiotic for your condition.  Antibiotics should not be used to treat conditions that we suspect are caused by viruses like the virus that causes the common cold or flu. However, some people can have Strep with atypical symptoms. You may need formal testing in clinic or office to confirm if your symptoms continue or worsen.  Providers prescribe antibiotics to treat infections caused by bacteria. Antibiotics are very powerful in treating bacterial infections when they are used properly.  To maintain their effectiveness, they should be used only when necessary.  Overuse of antibiotics has resulted in the development of super bugs that are resistant to treatment!    Home Care:  Only take medications as instructed by your medical team.  Do not drink alcohol while taking these medications.  A steam or ultrasonic humidifier can help congestion.  You can place a towel over your head and breathe in the steam from  hot water coming from a faucet.  Avoid close contacts especially the very young and the elderly.  Cover your mouth when you cough or sneeze.  Always remember to wash your hands.  Get Help Right Away If:  You develop worsening fever or throat pain.  You develop a severe head ache or visual changes.  Your symptoms persist after you have completed your treatment plan.  Make sure you  Understand these instructions.  Will watch your condition.  Will get help right away if you are not doing well or get worse.  Your e-visit answers were reviewed by a board certified advanced clinical practitioner to complete your personal care plan.  Depending on the condition, your plan could have included both over the counter or prescription medications.  If there is a problem please reply  once you have received a response from your provider.  Your safety is important to us.  If you have drug allergies check your prescription carefully.    You can use MyChart to ask questions about todays visit, request a non-urgent call back, or ask for a work or school excuse for 24 hours related to this e-Visit. If it has been greater than 24 hours you will need to follow up with your provider, or enter a new e-Visit to address those concerns.  You will get an e-mail in the next two days asking about your experience.  I hope that your e-visit has been valuable and will speed your recovery. Thank you for using e-visits.  Approximately 5 minutes was used   in reviewing the patient's chart, questionnaire, prescribing medications, and documentation.   

## 2020-08-09 MED FILL — AIMOVIG 140 MG/ML SOAJ: 140 | 28 days supply | Qty: 1 | Fill #10

## 2020-08-10 ENCOUNTER — Telehealth: Payer: 59 | Admitting: Physician Assistant

## 2020-08-10 ENCOUNTER — Other Ambulatory Visit (HOSPITAL_COMMUNITY): Payer: Self-pay | Admitting: Physician Assistant

## 2020-08-10 DIAGNOSIS — Z20822 Contact with and (suspected) exposure to covid-19: Secondary | ICD-10-CM

## 2020-08-10 MED ORDER — AEROCHAMBER PLUS FLO-VU MEDIUM MISC: 1.0000 | Freq: Once | 0 refills | Status: AC

## 2020-08-10 MED ORDER — ALBUTEROL SULFATE HFA 108 (90 BASE) MCG/ACT IN AERS: 2.0000 | INHALATION_SPRAY | RESPIRATORY_TRACT | 0 refills | Status: DC | PRN

## 2020-08-10 MED ORDER — FLUTICASONE PROPIONATE 50 MCG/ACT NA SUSP: 2.0000 | Freq: Every day | NASAL | 0 refills | Status: DC

## 2020-08-10 MED ORDER — BENZONATATE 100 MG PO CAPS: 100.0000 mg | ORAL_CAPSULE | Freq: Three times a day (TID) | ORAL | 0 refills | Status: DC | PRN

## 2020-08-10 MED FILL — FLUTICASONE PROP 50 MCG SPR: 50 | 30 days supply | Qty: 16 | Fill #0

## 2020-08-10 MED FILL — ALBUTEROL SULFATE HFA 108 (: 108 (90 BAS | 10 days supply | Qty: 18 | Fill #0

## 2020-08-10 MED FILL — AEROCHAMBER: 30 days supply | Qty: 1 | Fill #0

## 2020-08-10 MED FILL — BENZONATATE 100 MG CAPS: 100 | 6 days supply | Qty: 20 | Fill #0

## 2020-08-10 NOTE — Progress Notes (Signed)
E-Visit for [employer] Virus Screening  Your current symptoms could be consistent with the coronavirus or influenza - I recommend testing.  We will treat your symptoms in the meantime.  Many health care providers can now test patients at their office but not all are.  [facility] has multiple testing sites. For information on our COVID testing locations and hours go to [email or web address]  We are enrolling you in our MyChart Home Monitoring for COVID19 . Daily you will receive a questionnaire within the MyChart website. Our COVID 19 response team will be monitoring your responses daily.  Testing Information: The COVID-19 Community Testing sites are testing BY APPOINTMENT ONLY.  You can schedule online at [email or web address]  If you do not have access to a smart phone or computer you may call [phone] for an appointment.   Additional testing sites in the Community:  . For [facility] Testing sites in [facility]  [email or web address]  . For Pop-up testing sites in [place]  [email or web address]  . For [facility] [email or web address]  . For [facility] testing in [place] and [employer] [email or web address]  . For [facility] testing in [facility]   [email or web address]  For  more information about community testing call [phone]   Please quarantine yourself while awaiting your test results. Please stay home for a minimum of 10 days from the first day of illness with improving symptoms and you have had 24 hours of no fever (without the use of Tylenol (Acetaminophen) Motrin (Ibuprofen) or any fever reducing medication).  Also - Do not get tested prior to  returning to work because once you have had a positive test the test can stay positive for more than a month in some cases.   You should wear a mask or cloth face covering over your nose and mouth if you must be around other people or animals, including pets (even at home). Try to stay at least 6 feet away from other people. This will protect the people around you.  Please continue good preventive care measures, including:  frequent hand-washing, avoid touching your face, cover coughs/sneezes, stay out of crowds and keep a 6 foot distance from others.  COVID-19 is a respiratory illness with symptoms that are similar to the flu. Symptoms are typically mild to moderate, but there have been cases of severe illness and death due to the virus.   The following symptoms may appear 2-14 days after exposure: . Fever . Cough . Shortness of breath or difficulty breathing . Chills . Repeated shaking with chills . Muscle pain . Headache . Sore throat . New loss of taste or smell . Fatigue . Congestion or runny nose . Nausea or vomiting . Diarrhea  Go to the nearest hospital ED for assessment if fever/cough/breathlessness are severe or illness seems like a threat to life.  It is vitally important that if you feel that you have an infection such as this virus or any other virus that you stay home and away from places where you may spread it to others.  You should avoid contact with people age 46 and older.   You can use medication such as A prescription cough medication called Tessalon Perles 100 mg. You may take 1-2 capsules every 8 hours as needed for cough, A prescription inhaler called Albuterol MDI 90 mcg /actuation 2 puffs every 4 hours as needed for shortness of breath, wheezing, cough and A prescription for Fluticasone nasal spray 2 sprays in each nostril one time per day  You may also take acetaminophen (Tylenol) as needed for fever.  Reduce your risk of any infection by using the same precautions  used for avoiding the common cold or flu:  Marland Kitchen Wash your hands often with soap and warm water for at least 20 seconds.  If soap and water are not readily available, use an alcohol-based hand sanitizer with at least 60% alcohol.  . If coughing or sneezing, cover your mouth and nose by coughing or sneezing into the elbow areas of your shirt or coat, into a tissue or into your sleeve (not your hands). . Avoid shaking hands with others and consider head nods or verbal greetings only. . Avoid touching your eyes, nose, or mouth with unwashed hands.  . Avoid close contact with people who are sick. . Avoid places or events with large numbers of people in one location, like concerts or sporting events. . Carefully consider travel plans you have or are making. . If you are planning any travel outside or inside the [place], visit the CDC's Travelers' Health webpage for the latest health notices. . If you have some symptoms but not all symptoms, continue to monitor at home and seek medical attention if your symptoms worsen. . If you are having a medical emergency, call 911.  HOME CARE . Only take medications as instructed by your medical team. . Drink plenty of fluids and get plenty of rest. . A steam or ultrasonic humidifier can help if you have congestion.   GET HELP RIGHT AWAY IF YOU HAVE EMERGENCY WARNING SIGNS** FOR COVID-19. If you or someone is showing any of these signs seek emergency medical care immediately. Call 911 or proceed to your closest emergency facility if: . You develop worsening high fever. . Trouble breathing . Bluish lips or face . Persistent pain or pressure in the chest . New confusion . Inability to wake or stay awake . You cough up blood. . Your symptoms become more severe  **This list is not all possible symptoms. Contact your medical provider for any symptoms that are sever or concerning to you.  MAKE SURE YOU   Understand these instructions.  Will watch your  condition.  Will get help right away if you are not doing well or get worse.  Your e-visit answers were reviewed by a board certified advanced clinical practitioner to complete your personal care plan.  Depending on the condition, your plan could have included both over the counter or prescription medications.  If there is a problem please reply once you have received a response from your provider.  Your safety is important to [place].  If you have drug allergies check your prescription carefully.    You can use MyChart to ask questions about today's visit, request a non-urgent call back, or ask for a work or school excuse for 24 hours related to this e-Visit. If it has been greater than 24 hours you will need to follow up with your provider, or enter a new e-Visit to address those concerns. You will get an e-mail in the next two days asking about your experience.  I hope that your e-visit has been valuable and will speed your recovery. Thank you for using e-visits.   Greater than 5 minutes, yet less than 10 minutes of time have been spent researching, coordinating, and implementing care for this patient today

## 2020-08-20 MED FILL — ALENDRONATE NA 70 MG TAB: 70 | 28 days supply | Qty: 4 | Fill #5

## 2020-08-20 NOTE — Progress Notes (Signed)
[place] Phone: [phone] Subjective:   [name], am serving as a scribe for Dr. Hulan [name]. This visit occurred during the SARS-CoV-2 public health emergency.  Safety protocols were in place, including screening questions prior to the visit, additional usage of staff PPE, and extensive cleaning of exam room while observing appropriate contact time as indicated for disinfecting solutions.   I'm seeing this patient by the request  of:  Mallory Cruel, MD  CC: Back and neck pain follow-up  [id number]  [name] is a 56 y.o. female coming in with complaint of back and neck pain. OMT 06/21/2020. Patient states that she is having an increase in pain at the occiput. Is using pillow the we suggested. Gets up and walks around and pain subsides. Denies any radiating symptoms. Does use gabapentin at night if she is in pain that day.  Mostly seems to be helping neck at this time.  History goes away when she gets up and starts moving around.  Has not needed any of her headache medications  Medications patient has been prescribed:   Taking:         Reviewed prior external information including notes and imaging from previsou exam, outside providers and external EMR if available.   As well as notes that were available from care everywhere and other healthcare systems.  Past medical history, social, surgical and family history all reviewed in electronic medical record.  [name] pertanent information unless stated regarding to the chief complaint.   Past Medical History:  Diagnosis Date   History of ectopic pregnancy    04/ 2007  S/P LEFT SALPINGECTOMY   History of kidney stones    Hypothyroidism    Menorrhagia    Migraine     [name] Known Allergies   Review of Systems:  [name]  visual changes, nausea, vomiting, diarrhea, constipation, dizziness, abdominal pain, skin rash, fevers, chills, night  sweats, weight loss, swollen lymph nodes, body aches, joint swelling, chest pain, shortness of breath, mood changes. POSITIVE muscle aches, headache  Objective  Blood pressure 120/82, pulse 65, height 5\' 7"  (1.702 m), weight 185 lb (83.9 kg), SpO2 99 %.   General: [name] apparent distress alert and oriented x3 mood and affect normal, dressed appropriately.  HEENT: Pupils equal, extraocular movements intact  Respiratory: Patient's speak in full sentences and does not appear short of breath  Cardiovascular: [name] lower extremity edema, non tender, [name] erythema  Neuro: Cranial nerves II through XII are intact, neurovascularly intact in all extremities with 2+ DTRs and 2+ pulses.  Gait normal with good balance and coordination.  MSK:  Non tender with full range of motion and good stability and symmetric strength and tone of shoulders, elbows, wrist, hip, knee and ankles bilaterally.  Neck exam shows some increasing tightness at the occipital region.  More tightness also at the cervical thoracic area.  Mild tightness in the right side of the parascapular region.  Diagnosed with a loss of lordosis in the lumbar region mostly around the sacroiliac joint right greater than left  Osteopathic findings  C2 flexed rotated and side bent right C6 flexed rotated and side bent right T7 extended rotated and side bent left L2 flexed rotated and side bent right Sacrum right on right       Assessment and Plan: Cervicogenic headache Cervicogenic headache, mild worsening at this time.  Seems to be only at night and goes away.  Only taking gabapentin intermittently at this time.  Patient will continue with the posturing of the numbness.  Does respond well to osteopathic ambulation.  He did have some increase in tightness noted.  Follow-up with me again 6 to 8 weeks instead of regular 2 to 3 months.     Nonallopathic problems  Decision today to treat with OMT was based on Physical Exam  After verbal consent  patient was treated with HVLA, ME, FPR techniques in cervical, thoracic, lumbar, and sacral  areas  Patient tolerated the procedure well with improvement in symptoms  Patient given exercises, stretches and lifestyle modifications  See medications in patient instructions if given  Patient will follow up in 4-8 weeks      The above documentation has been reviewed and is accurate and complete Mallory Pulley, DO       Note: This dictation was prepared with Dragon dictation along with smaller phrase technology. Any transcriptional errors that result from this process are unintentional.

## 2020-08-21 ENCOUNTER — Encounter: Payer: Self-pay | Admitting: Family Medicine

## 2020-08-21 ENCOUNTER — Ambulatory Visit: Payer: 59 | Admitting: Family Medicine

## 2020-08-21 ENCOUNTER — Other Ambulatory Visit: Payer: Self-pay

## 2020-08-21 VITALS — BP 120/82 | HR 65 | Ht 67.0 in | Wt 185.0 lb

## 2020-08-21 DIAGNOSIS — M999 Biomechanical lesion, unspecified: Secondary | ICD-10-CM

## 2020-08-21 DIAGNOSIS — G4486 Cervicogenic headache: Secondary | ICD-10-CM | POA: Diagnosis not present

## 2020-08-21 NOTE — Patient Instructions (Signed)
Good to see you No big changes Let's check back in 6-8 weeks then we will space you out again

## 2020-08-21 NOTE — Assessment & Plan Note (Signed)
Cervicogenic headache, mild worsening at this time.  Seems to be only at night and goes away.  Only taking gabapentin intermittently at this time.  Patient will continue with the posturing of the numbness.  Does respond well to osteopathic ambulation.  He did have some increase in tightness noted.  Follow-up with me again 6 to 8 weeks instead of regular 2 to 3 months.

## 2020-08-30 ENCOUNTER — Ambulatory Visit: Payer: Self-pay

## 2020-08-30 ENCOUNTER — Encounter: Payer: Self-pay | Admitting: Orthopedic Surgery

## 2020-08-30 ENCOUNTER — Ambulatory Visit: Payer: 59 | Admitting: Orthopedic Surgery

## 2020-08-30 DIAGNOSIS — M722 Plantar fascial fibromatosis: Secondary | ICD-10-CM

## 2020-08-30 DIAGNOSIS — M79672 Pain in left foot: Secondary | ICD-10-CM | POA: Diagnosis not present

## 2020-08-30 MED ORDER — METHYLPREDNISOLONE ACETATE 40 MG/ML IJ SUSP
40.0000 mg | INTRAMUSCULAR | Status: AC | PRN
  Administered 2020-08-30: 40 mg

## 2020-08-30 MED ORDER — LIDOCAINE HCL 1 % IJ SOLN
2.0000 mL | INTRAMUSCULAR | Status: AC | PRN
Start: 2020-08-30 — End: 2020-08-30
  Administered 2020-08-30: 2 mL

## 2020-08-30 NOTE — Progress Notes (Signed)
Office Visit Note   Patient: [name]           Date of Birth: 08-18-1965           MRN: [id number] Visit Date: 08/30/2020              Requested by: Lawerance Cruel, [place],  [place] [address] PCP: Lawerance Cruel, MD  No chief complaint on file.     HPI: Patient is a 55 year old woman who presents with several month history of heel pain worse with start up worse after prolonged standing.  Assessment & Plan: Visit Diagnoses:  1. Pain in left foot     Plan: Patient was given instructions for Achilles stretching to do a minute at a time 5 times a day she underwent a plantar fascia injection.  Follow-Up Instructions: No follow-ups on file.   Ortho Exam  Patient is alert, oriented, no adenopathy, well-dressed, normal affect, normal respiratory effort. Examination patient has a good dorsalis pedis and posterior tibial pulse she has significant Achilles contracture with dorsiflexion just short of neutral with the knee extended.  There are no Achilles nodules the tarsal tunnel is nontender to palpation the calcaneus is nontender with compression.  She is point tender to palpation of the origin of the plantar fascia no fibromatosis.  Imaging: No results found. No images are attached to the encounter.  Labs: No results found for: HGBA1C, ESRSEDRATE, CRP, LABURIC, REPTSTATUS, GRAMSTAIN, CULT, LABORGA   Lab Results  Component Value Date   ALBUMIN 3.9 10/28/2013    No results found for: MG No results found for: VD25OH  No results found for: PREALBUMIN CBC EXTENDED Latest Ref Rng & Units 10/16/2016 10/28/2013 10/07/2007  WBC 4.0 - 10.5 K/uL 10.0 10.7(H) 12.4(H)  RBC 3.87 - 5.11 MIL/uL 4.05 4.38 4.11  HGB 12.0 - 15.0 g/dL 12.4 13.8 13.7  HCT 36.0 - 46.0 % 37.6 40.1 39.3  PLT 150 - 400 K/uL 356 264 316  NEUTROABS 1.7 - 7.7 K/uL - 8.7(H) -  LYMPHSABS 0.7 - 4.0 K/uL - 1.4 -     There is no height or weight on file to calculate BMI.  Orders:   Orders Placed This Encounter  Procedures  . XR Foot 2 Views Left   No orders of the defined types were placed in this encounter.    Procedures: Foot Inj  Date/Time: 08/30/2020 8:32 AM Performed by: Newt Minion, MD Authorized by: Newt Minion, MD   Consent Given by:  Patient Site marked: the procedure site was marked   Timeout: prior to procedure the correct patient, procedure, and site was verified   Indications:  Fasciitis and pain Condition: Plantar Fasciitis   Location: left plantar fascia muscle   Prep: patient was prepped and draped in usual sterile fashion   Needle Size:  22 G Approach:  Plantar Medications:  2 mL lidocaine 1 %; 40 mg methylPREDNISolone acetate 40 MG/ML Patient Tolerance:  Patient tolerated the procedure well with no immediate complications    Clinical Data: No additional findings.  ROS:  All other systems negative, except as noted in the HPI. Review of Systems  Objective: Vital Signs: There were no vitals taken for this visit.  Specialty Comments:  No specialty comments available.  PMFS History: Patient Active Problem List   Diagnosis Date Noted  . Other intraarticular fracture of lower end of left radius, initial encounter for closed fracture 12/05/2017  . Cervicogenic headache 03/17/2017  .  Nonallopathic lesion of cervical region 03/17/2017  . Nonallopathic lesion of thoracic region 03/17/2017  . Nonallopathic lesion of lumbosacral region 03/17/2017   Past Medical History:  Diagnosis Date  . History of ectopic pregnancy    04/ 2007  S/P LEFT SALPINGECTOMY  . History of kidney stones   . Hypothyroidism   . Menorrhagia   . Migraine     Family History  Problem Relation Age of Onset  . Hypertension Mother   . Aortic aneurysm Mother   . Liver cancer Father   . Prostate cancer Brother   . Migraines Sister     Past Surgical History:  Procedure Laterality Date  . CARPAL TUNNEL RELEASE Left 12/05/2017   Procedure: OPEN  REDUCTION INTERNAL FIXATION (ORIF) WRIST FRACTURE WITH CARPAL TUNNEL RELEASE;  Surgeon: Roseanne Kaufman, MD;  Location: [place];  Service: Orthopedics;  Laterality: Left;  . DILATATION & CURETTAGE/HYSTEROSCOPY WITH MYOSURE  2016  . DILATATION & CURETTAGE/HYSTEROSCOPY WITH MYOSURE N/A 10/23/2016   Procedure: DILATATION & CURETTAGE/HYSTEROSCOPY;  Surgeon: Everlene Farrier, MD;  Location: [place];  Service: Gynecology;  Laterality: N/A;  . DILATION AND EVACUATION  10/07/2007   retained poc  . LAPAROSCOPIC UNILATERAL SALPINGECTOMY Left 11/24/2005   Social History   Occupational History  . Occupation: [employer]  Tobacco Use  . Smoking status: Never Smoker  . Smokeless tobacco: Never Used  Vaping Use  . Vaping Use: Never used  Substance and Sexual Activity  . Alcohol use: No  . Drug use: No  . Sexual activity: Not on file

## 2020-09-03 MED FILL — GABAPENTIN 100 MG CAPSULE: 100 | 30 days supply | Qty: 30 | Fill #2

## 2020-09-07 MED FILL — AIMOVIG 140 MG/ML SOAJ: 140 | 28 days supply | Qty: 1 | Fill #11

## 2020-09-17 MED FILL — LEVOTHYROXINE SODIUM 100 MC: 100 | 84 days supply | Qty: 78 | Fill #2

## 2020-10-08 ENCOUNTER — Other Ambulatory Visit: Payer: Self-pay | Admitting: Neurology

## 2020-10-08 MED FILL — AIMOVIG 140 MG/ML SOAJ: 140 | 30 days supply | Qty: 1 | Fill #0

## 2020-10-08 NOTE — Telephone Encounter (Signed)
No refills until follow up

## 2020-10-08 NOTE — Progress Notes (Signed)
[name] Phone: [phone] Subjective:   [name], am serving as a scribe for Dr. Hulan Saas. This visit occurred during the SARS-CoV-2 public health emergency.  Safety protocols were in place, including screening questions prior to the visit, additional usage of staff PPE, and extensive cleaning of exam room while observing appropriate contact time as indicated for disinfecting solutions.   I'm seeing this patient by the request  of:  Lawerance Cruel, MD  CC: Headache and neck pain follow-up  [id number]  [name] is a 55 y.o. female coming in with complaint of back and neck pain. OMT 08/21/2020. Patient states that she is having bilateral leg achiness in both thighs and hips in glutes for one week. The only thing she has done differently since we last saw her was move an elliptical machine into her house. Denies any back pain but is having a hard time sleeping. Has noticed feet tingling and going to sleep yesterday.   Also complaining of left heel pain. Had injection from Dr. Sharol Given. Has been doing exercises as well. Has been using orthotic for left foot.   Medications patient has been prescribed: Gabapentin         Reviewed prior external information including notes and imaging from previsou exam, outside providers and external EMR if available.   As well as notes that were available from care everywhere and other healthcare systems.  Past medical history, social, surgical and family history all reviewed in electronic medical record.  [name] pertanent information unless stated regarding to the chief complaint.   Past Medical History:  Diagnosis Date  . History of ectopic pregnancy    04/ 2007  S/P LEFT SALPINGECTOMY  . History of kidney stones   . Hypothyroidism   . Menorrhagia   . Migraine     [name] Known Allergies   Review of Systems:  [name]  visual changes, nausea, vomiting, diarrhea,  constipation, dizziness, abdominal pain, skin rash, fevers, chills, night sweats, weight loss, swollen lymph nodes, body aches, joint swelling, chest pain, shortness of breath, mood changes. POSITIVE muscle aches, headache  Objective  Blood pressure (!) 122/92, pulse 92, height 5\' 7"  (1.702 m), weight 186 lb (84.4 kg), SpO2 97 %.   General: [name] apparent distress alert and oriented x3 mood and affect normal, dressed appropriately.  HEENT: Pupils equal, extraocular movements intact  Respiratory: Patient's speak in full sentences and does not appear short of breath  Cardiovascular: [name] lower extremity edema, non tender, [name] erythema  Gait normal with good balance and coordination.  MSK:   Low back exam does have some tightness noted.  There are some tightness with straight leg test.  Patient is tender to palpation over the quadriceps bilaterally even to light sensation.  Patient is neurovascular intact distally and deep tendon reflexes are intact. Back -  Upper back does have some tightness noted.  Neck does have some very mild tightness sidebending bilaterally. Osteopathic findings  C2 flexed rotated and side bent right C5 flexed rotated and side bent left T5 extended rotated and side bent right i        Assessment and Plan:  Low back pain Patient is having pain more in the front of the legs.  Neurovascularly intact distally.  Patient has recently been diagnosed with plantar fasciitis and could be contributing to some of this.  [name] recent medication changes.  We will get laboratory work-up to make sure  nothing else is contributing to some of this leg pain that is severe.  Discussed with patient that worsening symptoms to seek medical attention immediately.  Prednisone given for 5 days.  X-rays are pending.  Follow-up with me again in 4 weeks and will start physical therapy if necessary or will start osteopathic manipulation.  Focus on patient's neck today  Cervicogenic headache Cervicogenic  headaches.  Seems to be doing relatively well.  Responding well to osteopathic manipulation.  Patient is more concerned today about the leg pain.  We will get laboratory work-up as we stated avoided manipulation secondary to the potential for the lumbar radiculopathy and started on prednisone.  Has gabapentin.  Follow-up with me again 6 weeks   Nonallopathic problems  Decision today to treat with OMT was based on Physical Exam  After verbal consent patient was treated with HVLA, ME, FPR techniques in cervical, rib, thoracic  Areas avoided the lower back secondary to the discomfort.  Patient tolerated the procedure well with improvement in symptoms  Patient given exercises, stretches and lifestyle modifications  See medications in patient instructions if given  Patient will follow up in 4-8 weeks      The above documentation has been reviewed and is accurate and complete [name], DO       Note: This dictation was prepared with Dragon dictation along with smaller phrase technology. Any transcriptional errors that result from this process are unintentional.

## 2020-10-09 ENCOUNTER — Other Ambulatory Visit: Payer: Self-pay

## 2020-10-09 ENCOUNTER — Encounter: Payer: Self-pay | Admitting: Family Medicine

## 2020-10-09 ENCOUNTER — Other Ambulatory Visit: Payer: Self-pay | Admitting: Family Medicine

## 2020-10-09 ENCOUNTER — Ambulatory Visit (INDEPENDENT_AMBULATORY_CARE_PROVIDER_SITE_OTHER): Payer: 59

## 2020-10-09 ENCOUNTER — Ambulatory Visit: Payer: 59 | Admitting: Family Medicine

## 2020-10-09 VITALS — BP 122/92 | HR 92 | Ht 67.0 in | Wt 186.0 lb

## 2020-10-09 DIAGNOSIS — M5441 Lumbago with sciatica, right side: Secondary | ICD-10-CM

## 2020-10-09 DIAGNOSIS — R102 Pelvic and perineal pain: Secondary | ICD-10-CM | POA: Diagnosis not present

## 2020-10-09 DIAGNOSIS — M25551 Pain in right hip: Secondary | ICD-10-CM | POA: Diagnosis not present

## 2020-10-09 DIAGNOSIS — G4486 Cervicogenic headache: Secondary | ICD-10-CM

## 2020-10-09 DIAGNOSIS — M5442 Lumbago with sciatica, left side: Secondary | ICD-10-CM

## 2020-10-09 DIAGNOSIS — M545 Low back pain, unspecified: Secondary | ICD-10-CM

## 2020-10-09 DIAGNOSIS — M999 Biomechanical lesion, unspecified: Secondary | ICD-10-CM | POA: Diagnosis not present

## 2020-10-09 DIAGNOSIS — M25552 Pain in left hip: Secondary | ICD-10-CM | POA: Diagnosis not present

## 2020-10-09 DIAGNOSIS — M2550 Pain in unspecified joint: Secondary | ICD-10-CM

## 2020-10-09 DIAGNOSIS — M47816 Spondylosis without myelopathy or radiculopathy, lumbar region: Secondary | ICD-10-CM | POA: Diagnosis not present

## 2020-10-09 LAB — CBC WITH DIFFERENTIAL/PLATELET
Basophils Absolute: 0 10*3/uL (ref 0.0–0.1)
Basophils Relative: 0.7 % (ref 0.0–3.0)
Eosinophils Absolute: 0.1 10*3/uL (ref 0.0–0.7)
Eosinophils Relative: 1.5 % (ref 0.0–5.0)
HCT: 42.3 % (ref 36.0–46.0)
Hemoglobin: 14.3 g/dL (ref 12.0–15.0)
Lymphocytes Relative: 25.3 % (ref 12.0–46.0)
Lymphs Abs: 1.8 10*3/uL (ref 0.7–4.0)
MCHC: 33.7 g/dL (ref 30.0–36.0)
MCV: 91.5 fl (ref 78.0–100.0)
Monocytes Absolute: 0.5 10*3/uL (ref 0.1–1.0)
Monocytes Relative: 7.4 % (ref 3.0–12.0)
Neutro Abs: 4.6 10*3/uL (ref 1.4–7.7)
Neutrophils Relative %: 65.1 % (ref 43.0–77.0)
Platelets: 288 10*3/uL (ref 150.0–400.0)
RBC: 4.62 Mil/uL (ref 3.87–5.11)
RDW: 13.8 % (ref 11.5–15.5)
WBC: 7 10*3/uL (ref 4.0–10.5)

## 2020-10-09 LAB — COMPREHENSIVE METABOLIC PANEL
ALT: 23 U/L (ref 0–35)
AST: 22 U/L (ref 0–37)
Albumin: 4.4 g/dL (ref 3.5–5.2)
Alkaline Phosphatase: 58 U/L (ref 39–117)
BUN: 14 mg/dL (ref 6–23)
CO2: 31 mEq/L (ref 19–32)
Calcium: 9.8 mg/dL (ref 8.4–10.5)
Chloride: 101 mEq/L (ref 96–112)
Creatinine, Ser: 0.74 mg/dL (ref 0.40–1.20)
GFR: 92.72 mL/min (ref >60.00)
Glucose, Bld: 84 mg/dL (ref 70–99)
Potassium: 4 mEq/L (ref 3.5–5.1)
Sodium: 138 mEq/L (ref 135–145)
Total Bilirubin: 0.8 mg/dL (ref 0.2–1.2)
Total Protein: 7.3 g/dL (ref 6.0–8.3)

## 2020-10-09 LAB — SEDIMENTATION RATE: Sed Rate: 11 mm/hr (ref 0–30)

## 2020-10-09 LAB — FERRITIN: Ferritin: 73.8 ng/mL (ref 10.0–291.0)

## 2020-10-09 LAB — IBC PANEL
Iron: 106 ug/dL (ref 42–145)
Saturation Ratios: 24 % (ref 20.0–50.0)
Transferrin: 316 mg/dL (ref 212.0–360.0)

## 2020-10-09 LAB — CK: Total CK: 64 U/L (ref 7–177)

## 2020-10-09 LAB — VITAMIN D 25 HYDROXY (VIT D DEFICIENCY, FRACTURES): VITD: 36.66 ng/mL (ref 30.00–100.00)

## 2020-10-09 MED ORDER — PREDNISONE 20 MG PO TABS: 20.0000 mg | ORAL_TABLET | Freq: Every day | ORAL | 0 refills | Status: DC

## 2020-10-09 MED FILL — predniSONE 20 MG TABS: 20 | 5 days supply | Qty: 5 | Fill #0

## 2020-10-09 NOTE — Patient Instructions (Addendum)
Good to see you Labs today Back and pelvis xray today Back exercises 3 times a week Prednisone 20 mg for 5 days Do not take any other antiinlammatories See me again in 4 see me sooner if any worse

## 2020-10-09 NOTE — Assessment & Plan Note (Signed)
Patient is having pain more in the front of the legs.  Neurovascularly intact distally.  Patient has recently been diagnosed with plantar fasciitis and could be contributing to some of this.  No recent medication changes.  We will get laboratory work-up to make sure nothing else is contributing to some of this leg pain that is severe.  Discussed with patient that worsening symptoms to seek medical attention immediately.  Prednisone given for 5 days.  X-rays are pending.  Follow-up with me again in 4 weeks and will start physical therapy if necessary or will start osteopathic manipulation.  Focus on patient's neck today

## 2020-10-09 NOTE — Assessment & Plan Note (Signed)
Cervicogenic headaches.  Seems to be doing relatively well.  Responding well to osteopathic manipulation.  Patient is more concerned today about the leg pain.  We will get laboratory work-up as we stated avoided manipulation secondary to the potential for the lumbar radiculopathy and started on prednisone.  Has gabapentin.  Follow-up with me again 6 weeks
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
## 2020-10-12 ENCOUNTER — Other Ambulatory Visit: Payer: Self-pay

## 2020-10-12 ENCOUNTER — Other Ambulatory Visit: Payer: Self-pay | Admitting: Neurology

## 2020-10-12 ENCOUNTER — Ambulatory Visit: Payer: 59 | Admitting: Neurology

## 2020-10-12 ENCOUNTER — Encounter: Payer: Self-pay | Admitting: Neurology

## 2020-10-12 VITALS — BP 139/83 | HR 81 | Ht 67.0 in | Wt 188.8 lb

## 2020-10-12 DIAGNOSIS — G43009 Migraine without aura, not intractable, without status migrainosus: Secondary | ICD-10-CM | POA: Diagnosis not present

## 2020-10-12 DIAGNOSIS — G4486 Cervicogenic headache: Secondary | ICD-10-CM

## 2020-10-12 MED ORDER — AIMOVIG 140 MG/ML ~~LOC~~ SOAJ: SUBCUTANEOUS | 5 refills | Status: DC

## 2020-10-12 MED ORDER — GABAPENTIN 100 MG PO CAPS: 100.0000 mg | ORAL_CAPSULE | Freq: Every day | ORAL | 5 refills | Status: DC

## 2020-10-12 MED ORDER — GABAPENTIN 400 MG PO CAPS: 400.0000 mg | ORAL_CAPSULE | Freq: Every day | ORAL | 5 refills | Status: DC

## 2020-10-12 MED FILL — GABAPENTIN 100 MG CAPSULE: 100 | 30 days supply | Qty: 30 | Fill #0

## 2020-10-12 MED FILL — GABAPENTIN 400 MG CAPSULE: 400 | 30 days supply | Qty: 30 | Fill #0

## 2020-10-12 NOTE — Patient Instructions (Signed)
1.  Refilled Aimovig 2.  Use relpax as needed 3.  Limit use of pain relievers to no more than 2 days out of week to prevent risk of rebound or medication-overuse headache. 4.  Keep headache diary

## 2020-10-14 MED FILL — ALENDRONATE NA 70 MG TAB: 70 | 28 days supply | Qty: 4 | Fill #7

## 2020-11-05 MED FILL — GABAPENTIN 100 MG CAPSULE: 100 | 30 days supply | Qty: 30 | Fill #1

## 2020-11-05 MED FILL — AIMOVIG 140 MG/ML SOAJ: 140 | 30 days supply | Qty: 1 | Fill #0
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
## 2020-11-08 ENCOUNTER — Other Ambulatory Visit: Payer: Self-pay

## 2020-11-08 ENCOUNTER — Encounter: Payer: Self-pay | Admitting: Family Medicine

## 2020-11-08 ENCOUNTER — Ambulatory Visit: Payer: 59 | Admitting: Family Medicine

## 2020-11-08 VITALS — BP 126/90 | HR 73 | Ht 67.0 in | Wt 186.0 lb

## 2020-11-08 DIAGNOSIS — M999 Biomechanical lesion, unspecified: Secondary | ICD-10-CM

## 2020-11-08 DIAGNOSIS — M545 Low back pain, unspecified: Secondary | ICD-10-CM

## 2020-11-08 DIAGNOSIS — G8929 Other chronic pain: Secondary | ICD-10-CM | POA: Diagnosis not present

## 2020-11-08 DIAGNOSIS — M7061 Trochanteric bursitis, right hip: Secondary | ICD-10-CM | POA: Insufficient documentation

## 2020-11-08 NOTE — Patient Instructions (Addendum)
Good to see you MRI lumbar [place] Did the hip injection today See me again in 6 weeks

## 2020-11-08 NOTE — Assessment & Plan Note (Signed)
Patient given injection today and tolerated the procedure well, discussed icing regimen and home exercises, discussed which activities to do which wants to avoid.  Patient did respond fairly well.  Differential though does include the lumbar radiculopathy with patient having some radicular symptoms going past the hip.  Patient does not make improvement I would like her to go forward with the MRI.  Patient is in agreement with the plan patient has done all other physical therapy, manipulation, icing regimen, home therapy for the back and has failed that and is why we need advanced imaging.

## 2020-11-08 NOTE — Assessment & Plan Note (Signed)
worsenign low back pain. Mild radiculopathy, tried GT injection today though just to see if that is playing a potential role.  I am concerned that the patient does have potentially a nerve pain from the right side.  Discussed with patient in great length, icing regimen, home exercises.  Patient will follow up with me again after MRI.  Depending on the MRI patient could be a candidate for epidurals.  Patient did have x-rays that did not show any significant bony abnormality.  Patient does have unfortunately osteoporosis do need to rule out occult fracture.

## 2020-11-11 ENCOUNTER — Other Ambulatory Visit (HOSPITAL_COMMUNITY): Payer: Self-pay

## 2020-11-11 MED FILL — Alendronate Sodium Tab 70 MG: ORAL | 28 days supply | Qty: 4 | Fill #0 | Status: AC

## 2020-11-11 MED FILL — Gabapentin Cap 400 MG: ORAL | 30 days supply | Qty: 30 | Fill #0 | Status: AC

## 2020-11-15 ENCOUNTER — Other Ambulatory Visit (HOSPITAL_COMMUNITY): Payer: Self-pay

## 2020-11-17 ENCOUNTER — Ambulatory Visit (INDEPENDENT_AMBULATORY_CARE_PROVIDER_SITE_OTHER): Payer: 59

## 2020-11-17 ENCOUNTER — Other Ambulatory Visit: Payer: Self-pay

## 2020-11-17 DIAGNOSIS — G8929 Other chronic pain: Secondary | ICD-10-CM | POA: Diagnosis not present

## 2020-11-17 DIAGNOSIS — M545 Low back pain, unspecified: Secondary | ICD-10-CM

## 2020-11-19 ENCOUNTER — Telehealth: Payer: Self-pay | Admitting: Family Medicine

## 2020-11-19 NOTE — Telephone Encounter (Signed)
Patient called asking about her MRI results.  I gave her Dr Thompson Caul response. She said that she is still having constant pain. Since her injection, it has been more of a dull ache rather than stabbing pain but she did not know where to go from here  Please advise.

## 2020-11-20 NOTE — Telephone Encounter (Signed)
2 ideas 1. Try facet injections in the lower back and see if that is contributing.  2. Get a pelvis MRI to see if anything else could contribute to the pain.

## 2020-11-28 ENCOUNTER — Telehealth: Payer: 59 | Admitting: Physician Assistant

## 2020-11-28 ENCOUNTER — Other Ambulatory Visit (HOSPITAL_COMMUNITY): Payer: Self-pay

## 2020-11-28 DIAGNOSIS — T25429A Corrosion of unspecified degree of unspecified foot, initial encounter: Secondary | ICD-10-CM

## 2020-11-28 DIAGNOSIS — S99921A Unspecified injury of right foot, initial encounter: Secondary | ICD-10-CM | POA: Diagnosis not present

## 2020-11-28 MED ORDER — DOXYCYCLINE HYCLATE 100 MG PO TABS
100.0000 mg | ORAL_TABLET | Freq: Two times a day (BID) | ORAL | 0 refills | Status: DC
Start: 2020-11-28 — End: 2021-07-29
  Filled 2020-11-28: qty 14, 7d supply, fill #0

## 2020-11-28 NOTE — Progress Notes (Signed)
I have spent 5 minutes in review of e-visit questionnaire, review and updating patient chart, medical decision making and response to patient.   Kiernan Atkerson Cody Zenya Hickam, PA-C    
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
## 2020-11-29 DIAGNOSIS — Z0100 Encounter for examination of eyes and vision without abnormal findings: Secondary | ICD-10-CM | POA: Diagnosis not present

## 2020-12-05 ENCOUNTER — Other Ambulatory Visit (HOSPITAL_COMMUNITY): Payer: Self-pay

## 2020-12-05 MED FILL — Gabapentin Cap 100 MG: ORAL | 30 days supply | Qty: 30 | Fill #0 | Status: AC

## 2020-12-05 MED FILL — Erenumab-aooe Subcutaneous Soln Auto-Injector 140 MG/ML: SUBCUTANEOUS | 30 days supply | Qty: 1 | Fill #0 | Status: AC

## 2020-12-06 ENCOUNTER — Other Ambulatory Visit (HOSPITAL_COMMUNITY): Payer: Self-pay

## 2020-12-10 ENCOUNTER — Other Ambulatory Visit (HOSPITAL_COMMUNITY): Payer: Self-pay

## 2020-12-10 MED FILL — Alendronate Sodium Tab 70 MG: ORAL | 28 days supply | Qty: 4 | Fill #1 | Status: AC

## 2020-12-18 NOTE — Progress Notes (Signed)
[name] Phone: [phone] Subjective:   I [name] am serving as a Education administrator for Dr. Hulan Saas.  This visit occurred during the SARS-CoV-2 public health emergency.  Safety protocols were in place, including screening questions prior to the visit, additional usage of staff PPE, and extensive cleaning of exam room while observing appropriate contact time as indicated for disinfecting solutions.   I'm seeing this patient by the request  of:  Lawerance Cruel, MD  CC: Neck pain and back pain follow-up  [id number]   11/08/2020 Patient given injection today and tolerated the procedure well, discussed icing regimen and home exercises, discussed which activities to do which wants to avoid.  Patient did respond fairly well.  Differential though does include the lumbar radiculopathy with patient having some radicular symptoms going past the hip.  Patient does not make improvement I would like her to go forward with the MRI.  Patient is in agreement with the plan patient has done all other physical therapy, manipulation, icing regimen, home therapy for the back and has failed that and is why we need advanced imaging.  worsenign low back pain. Mild radiculopathy, tried GT injection today though just to see if that is playing a potential role.  I am concerned that the patient does have potentially a nerve pain from the right side.  Discussed with patient in great length, icing regimen, home exercises.  Patient will follow up with me again after MRI.  Depending on the MRI patient could be a candidate for epidurals.  Patient did have x-rays that did not show any significant bony abnormality.  Patient does have unfortunately osteoporosis do need to rule out occult fracture.  Update 12/19/2020 [name] is a 55 y.o. female coming in with complaint of back and neck pain. OMT 11/08/2020. R hip pain. Patient states she has had  more headaches. States she wakes up every morning with a headache. Hip is a little better. Pain with laying on the hip.  Patient states that he continues to have the same trouble with her headaches neck exam at this point.  Medications patient has been prescribed: None Patient did have an MRI of the lumbar spine.  MRI was fairly unremarkable with very mild facet arthropathy at L4-L5 and L5-S1      Past Medical History:  Diagnosis Date  . History of ectopic pregnancy    04/ 2007  S/P LEFT SALPINGECTOMY  . History of kidney stones   . Hypothyroidism   . Menorrhagia   . Migraine     No Known Allergies   Review of Systems:  No  visual changes, nausea, vomiting, diarrhea, constipation, dizziness, abdominal pain, skin rash, fevers, chills, night sweats, weight loss, swollen lymph nodes, body aches, joint swelling, chest pain, shortness of breath, mood changes. POSITIVE muscle aches, headaches  Objective  Blood pressure 140/90, pulse 71, height 5\' 7"  (1.702 m), weight 186 lb (84.4 kg), SpO2 99 %.   General: No apparent distress alert and oriented x3 mood and affect normal, dressed appropriately.  HEENT: Pupils equal, extraocular movements intact  Respiratory: Patient's speak in full sentences and does not appear short of breath  Cardiovascular: No lower extremity edema, non tender, no erythema  Gait normal with good balance and coordination.  MSK:  Non tender with full range of motion and good stability and symmetric strength and tone of shoulders, elbows, wrist, hip, knee and ankles bilaterally.  Back -  patient does have tightness noted in the cervical thoracic area noted.  Tightness in the parascapular region right greater than left.  Patient does have some very mild scapular dyskinesis noted on the right.  Low back exam still has tightness noted mostly around the sacroiliac joint bilaterally.  Tightness with Corky Sox bilaterally.  Negative straight leg test.  Osteopathic findings  C2  flexed rotated and side bent right C3 flexed rotated and side bent left  C6 flexed rotated and side bent left T3 extended rotated and side bent right inhaled rib T9 extended rotated and side bent left L2 flexed rotated and side bent right L5 flexed rotated and side bent left Sacrum right on right       Assessment and Plan:  Greater trochanteric bursitis of right hip Patient continues to have more of the right hip pain.  Still think it is mostly likely bursitis especially after MRI of the lumbar spine did not show any true nerve impingement.  We discussed with patient about the possibility of advanced imaging of the pelvic region the patient would like to hold.  We will see how patient does with conservative therapy otherwise.  Follow-up with me again in 6 to 8 weeks continue medications including gabapentin  Cervicogenic headache Chronic problem with mild exacerbation.  Has responded to manipulation previously.  Continues to see neurology as well.  Follow-up with me again in 6 to 8 weeks    Nonallopathic problems  Decision today to treat with OMT was based on Physical Exam  After verbal consent patient was treated with HVLA, ME, FPR techniques in cervical, rib, thoracic, lumbar, and sacral  areas  Patient tolerated the procedure well with improvement in symptoms  Patient given exercises, stretches and lifestyle modifications  See medications in patient instructions if given  Patient will follow up in 4-8 weeks      The above documentation has been reviewed and is accurate and complete [name], DO       Note: This dictation was prepared with Dragon dictation along with smaller phrase technology. Any transcriptional errors that result from this process are unintentional.

## 2020-12-19 ENCOUNTER — Other Ambulatory Visit (HOSPITAL_COMMUNITY): Payer: Self-pay

## 2020-12-19 ENCOUNTER — Encounter: Payer: Self-pay | Admitting: Family Medicine

## 2020-12-19 ENCOUNTER — Ambulatory Visit: Payer: 59 | Admitting: Family Medicine

## 2020-12-19 ENCOUNTER — Other Ambulatory Visit: Payer: Self-pay

## 2020-12-19 VITALS — BP 140/90 | HR 71 | Ht 67.0 in | Wt 186.0 lb

## 2020-12-19 DIAGNOSIS — G4486 Cervicogenic headache: Secondary | ICD-10-CM | POA: Diagnosis not present

## 2020-12-19 DIAGNOSIS — M9908 Segmental and somatic dysfunction of rib cage: Secondary | ICD-10-CM

## 2020-12-19 DIAGNOSIS — M7061 Trochanteric bursitis, right hip: Secondary | ICD-10-CM | POA: Diagnosis not present

## 2020-12-19 DIAGNOSIS — M9903 Segmental and somatic dysfunction of lumbar region: Secondary | ICD-10-CM | POA: Diagnosis not present

## 2020-12-19 DIAGNOSIS — M9902 Segmental and somatic dysfunction of thoracic region: Secondary | ICD-10-CM | POA: Diagnosis not present

## 2020-12-19 DIAGNOSIS — M9904 Segmental and somatic dysfunction of sacral region: Secondary | ICD-10-CM | POA: Diagnosis not present

## 2020-12-19 DIAGNOSIS — M9901 Segmental and somatic dysfunction of cervical region: Secondary | ICD-10-CM | POA: Diagnosis not present

## 2020-12-19 MED FILL — Levothyroxine Sodium Tab 100 MCG: ORAL | 90 days supply | Qty: 90 | Fill #0 | Status: AC

## 2020-12-19 NOTE — Patient Instructions (Addendum)
Good to see you Travel safe Lets see how this test goes See me again in 6-8 weeks

## 2020-12-19 NOTE — Assessment & Plan Note (Signed)
Chronic problem with mild exacerbation.  Has responded to manipulation previously.  Continues to see neurology as well.  Follow-up with me again in 6 to 8 weeks

## 2020-12-19 NOTE — Assessment & Plan Note (Signed)
Patient continues to have more of the right hip pain.  Still think it is mostly likely bursitis especially after MRI of the lumbar spine did not show any true nerve impingement.  We discussed with patient about the possibility of advanced imaging of the pelvic region the patient would like to hold.  We will see how patient does with conservative therapy otherwise.  Follow-up with me again in 6 to 8 weeks continue medications including gabapentin

## 2020-12-21 ENCOUNTER — Other Ambulatory Visit (HOSPITAL_COMMUNITY): Payer: Self-pay

## 2020-12-21 MED FILL — Erenumab-aooe Subcutaneous Soln Auto-Injector 140 MG/ML: SUBCUTANEOUS | 30 days supply | Qty: 1 | Fill #1 | Status: AC

## 2020-12-24 ENCOUNTER — Telehealth: Payer: 59 | Admitting: Emergency Medicine

## 2020-12-24 DIAGNOSIS — R140 Abdominal distension (gaseous): Secondary | ICD-10-CM

## 2020-12-24 NOTE — Progress Notes (Signed)
[name],  Based on what you shared with me, symptoms persisting despite taking your usual medication and never being seen by a medical provider for your heart burn, I feel your condition warrants further evaluation and I recommend that you be seen in a face to face office visit.   NOTE: If you entered your credit card information for this eVisit, you will not be charged. You may see a "hold" on your card for the $35 but that hold will drop off and you will not have a charge processed.   If you are having a true medical emergency please call 911.      For an urgent face to face visit, [facility] has six urgent care centers for your convenience:     [facility] [place] [facility] [place] Get Driving Directions [phone] [address] [place], [place] [address] . 8 am - 4 pm Monday - Friday    [facility]) Get Driving Directions [phone] [address] [place], [place] [address] . 8 am to 8 pm Monday-Friday . 10 am to 6 pm [facility]) Get Driving Directions [phone]  [address] [place],  [place]  [address] . 8 am to 8 pm Monday-Friday . 8 am to 4 pm [facility] Get Driving Directions [phone] [place], [place], [place] [address] . 8 am to 8 pm Monday-Friday . 8 am to 4 pm [facility] at [facility] Get Driving Directions  [phone] [address] [place], [place] [address] . 8 am to 8 pm Monday-Friday . 8 am to 4 pm [facility] at [place] Get Driving Directions [phone] [address], [place], [place] [address] . 8 am to 8 pm Monday-Friday . 8 am to 4 pm Saturday-Sunday     Your MyChart E-visit questionnaire answers were reviewed by a board certified advanced  clinical practitioner to complete your personal care plan based on your specific symptoms.  Thank you for using e-Visits.    Approximately 5 minutes was spent documenting and reviewing patient's chart.

## 2020-12-29 ENCOUNTER — Other Ambulatory Visit (HOSPITAL_COMMUNITY): Payer: Self-pay

## 2021-01-16 ENCOUNTER — Other Ambulatory Visit (HOSPITAL_COMMUNITY): Payer: Self-pay

## 2021-01-16 ENCOUNTER — Telehealth: Payer: Self-pay

## 2021-01-16 MED ORDER — ELETRIPTAN HYDROBROMIDE 40 MG PO TABS
ORAL_TABLET | ORAL | 1 refills | Status: DC
  Filled 2021-01-16: qty 12, 30d supply, fill #0
  Filled 2021-02-15: qty 24, 60d supply, fill #0

## 2021-01-16 NOTE — Telephone Encounter (Signed)
Refill request received from pt, Please Refill Relpax.  Script sent to the pharamcy

## 2021-01-17 NOTE — Progress Notes (Signed)
Evangelia Giammarco KeyJanora Norlander - PA Case ID: [id number]? Call us at [phone] Status Sent to [facility] Eletriptan Hydrobromide 40MG  tablets Form MedImpact ePA Form 2017 NCPDP

## 2021-01-24 DIAGNOSIS — Z23 Encounter for immunization: Secondary | ICD-10-CM | POA: Diagnosis not present

## 2021-01-24 DIAGNOSIS — E559 Vitamin D deficiency, unspecified: Secondary | ICD-10-CM | POA: Diagnosis not present

## 2021-01-24 DIAGNOSIS — G479 Sleep disorder, unspecified: Secondary | ICD-10-CM | POA: Diagnosis not present

## 2021-01-24 DIAGNOSIS — Z0000 Encounter for general adult medical examination without abnormal findings: Secondary | ICD-10-CM | POA: Diagnosis not present

## 2021-01-24 DIAGNOSIS — E7800 Pure hypercholesterolemia, unspecified: Secondary | ICD-10-CM | POA: Diagnosis not present

## 2021-01-24 DIAGNOSIS — E039 Hypothyroidism, unspecified: Secondary | ICD-10-CM | POA: Diagnosis not present

## 2021-01-24 DIAGNOSIS — Z131 Encounter for screening for diabetes mellitus: Secondary | ICD-10-CM | POA: Diagnosis not present

## 2021-01-26 ENCOUNTER — Other Ambulatory Visit (HOSPITAL_COMMUNITY): Payer: Self-pay

## 2021-01-28 ENCOUNTER — Other Ambulatory Visit (HOSPITAL_COMMUNITY): Payer: Self-pay

## 2021-01-28 ENCOUNTER — Other Ambulatory Visit: Payer: Self-pay

## 2021-01-28 MED ORDER — RIZATRIPTAN BENZOATE 10 MG PO TBDP
10.0000 mg | ORAL_TABLET | ORAL | 5 refills | Status: DC | PRN
  Filled 2021-01-28: qty 10, 30d supply, fill #0

## 2021-01-28 MED FILL — Erenumab-aooe Subcutaneous Soln Auto-Injector 140 MG/ML: SUBCUTANEOUS | 30 days supply | Qty: 1 | Fill #2 | Status: AC

## 2021-01-28 MED FILL — Alendronate Sodium Tab 70 MG: ORAL | 28 days supply | Qty: 4 | Fill #2 | Status: AC

## 2021-01-28 NOTE — Progress Notes (Signed)
Per Dr.Jaffe, Send for Maxalt-MLT 10mg  - take as needed.  May repeat in 2 hours.  Maximum 2 tablets in 24 hours.  Pt insurance denied Relpax.

## 2021-01-29 ENCOUNTER — Other Ambulatory Visit (HOSPITAL_COMMUNITY): Payer: Self-pay

## 2021-01-29 DIAGNOSIS — Z0000 Encounter for general adult medical examination without abnormal findings: Secondary | ICD-10-CM | POA: Diagnosis not present

## 2021-01-29 MED ORDER — LEVOTHYROXINE SODIUM 88 MCG PO TABS
ORAL_TABLET | ORAL | 4 refills | Status: DC
  Filled 2021-01-29 – 2021-02-27 (×2): qty 90, 90d supply, fill #0
  Filled 2021-06-26: qty 90, 90d supply, fill #1
  Filled 2021-09-19: qty 90, 90d supply, fill #2
  Filled 2021-12-28: qty 90, 90d supply, fill #3

## 2021-01-30 ENCOUNTER — Other Ambulatory Visit (HOSPITAL_COMMUNITY): Payer: Self-pay

## 2021-01-30 NOTE — Progress Notes (Signed)
[facility] [address] [place] Phone: [phone] Subjective:   I [name] am serving as a Education administrator for Dr. Hulan Saas.  This visit occurred during the SARS-CoV-2 public health emergency.  Safety protocols were in place, including screening questions prior to the visit, additional usage of staff PPE, and extensive cleaning of exam room while observing appropriate contact time as indicated for disinfecting solutions.   I'm seeing this patient by the request  of:  Lawerance Cruel, MD  CC: Headache and neck pain  [id number]  JAKIAH BIENAIME is a 55 y.o. female coming in with complaint of back and neck pain. OMT 12/19/2020. Patient states right hip has been bothering her and she would like an injection. States her back is doing ok. Still some pain in the lower back especially at night. Having more headaches than usual.   Medications patient has been prescribed: None          Reviewed prior external information including notes and imaging from previsou exam, outside providers and external EMR if available.   As well as notes that were available from care everywhere and other healthcare systems.  Past medical history, social, surgical and family history all reviewed in electronic medical record.  No pertanent information unless stated regarding to the chief complaint.   Past Medical History:  Diagnosis Date   History of ectopic pregnancy    04/ 2007  S/P LEFT SALPINGECTOMY   History of kidney stones    Hypothyroidism    Menorrhagia    Migraine     No Known Allergies   Review of Systems:  No headache, visual changes, nausea, vomiting, diarrhea, constipation, dizziness, abdominal pain, skin rash, fevers, chills, night sweats, weight loss, swollen lymph nodes, body aches, joint swelling, chest pain, shortness of breath, mood changes. POSITIVE muscle aches  Objective  Blood pressure 130/90, pulse 71, height 5\' 7"   (1.702 m), weight 182 lb (82.6 kg), SpO2 100 %.   General: No apparent distress alert and oriented x3 mood and affect normal, dressed appropriately.  HEENT: Pupils equal, extraocular movements intact  Respiratory: Patient's speak in full sentences and does not appear short of breath  Cardiovascular: No lower extremity edema, non tender, no erythema  Tenderness to palpation over the greater trochanteric area on the right side.  Positive FABER test.  Negative straight leg test.  Mild worsening of pain in the back with extension.  5 out of 5 strength of the lower extremities.  Osteopathic findings  C2 flexed rotated and side bent right C7 flexed rotated and side bent left T3 extended rotated and side bent right inhaled rib T8 extended rotated and side bent left L2 flexed rotated and side bent right Sacrum right on right  After verbal consent patient was prepped with alcohol swab and with a 21-gauge 2 inch needle injected into the right greater trochanteric area with a total of 2 cc of 0.5% Marcaine and 1 cc of Kenalog 40 mg/mL.   Assessment and Plan:  Cervicogenic headache Chronic problem with mild exacerbation.  His antibiotic ergonomics.  Discussed which activities to do which wants to avoid.  Patient has had worsening of her medications recently changed patient states that still not severe enough that stops her from activity.  Follow-up with me again 6 weeks  Greater trochanteric bursitis of right hip Repeat injection given again today.  Tolerated the procedure well.  Discussed which activities to do which wants to avoid.  Increase activity slowly.  Hopefully this will make some benefit.  MRI of the lumbar spine was unremarkable.  Follow-up with me again in 6 weeks   Nonallopathic problems  Decision today to treat with OMT was based on Physical Exam  After verbal consent patient was treated with HVLA, ME, FPR techniques in cervical, rib, thoracic, lumbar, and sacral  areas  Patient  tolerated the procedure well with improvement in symptoms  Patient given exercises, stretches and lifestyle modifications  See medications in patient instructions if given  Patient will follow up in 4-8 weeks      The above documentation has been reviewed and is accurate and complete [name], DO       Note: This dictation was prepared with Dragon dictation along with smaller phrase technology. Any transcriptional errors that result from this process are unintentional.

## 2021-01-31 ENCOUNTER — Other Ambulatory Visit: Payer: Self-pay

## 2021-01-31 ENCOUNTER — Other Ambulatory Visit (HOSPITAL_COMMUNITY): Payer: Self-pay

## 2021-01-31 ENCOUNTER — Ambulatory Visit: Payer: 59 | Admitting: Family Medicine

## 2021-01-31 ENCOUNTER — Telehealth: Payer: Self-pay | Admitting: Neurology

## 2021-01-31 ENCOUNTER — Encounter: Payer: Self-pay | Admitting: Family Medicine

## 2021-01-31 VITALS — BP 130/90 | HR 71 | Ht 67.0 in | Wt 182.0 lb

## 2021-01-31 DIAGNOSIS — M9903 Segmental and somatic dysfunction of lumbar region: Secondary | ICD-10-CM | POA: Diagnosis not present

## 2021-01-31 DIAGNOSIS — M9908 Segmental and somatic dysfunction of rib cage: Secondary | ICD-10-CM

## 2021-01-31 DIAGNOSIS — M9904 Segmental and somatic dysfunction of sacral region: Secondary | ICD-10-CM | POA: Diagnosis not present

## 2021-01-31 DIAGNOSIS — G4486 Cervicogenic headache: Secondary | ICD-10-CM | POA: Diagnosis not present

## 2021-01-31 DIAGNOSIS — M9902 Segmental and somatic dysfunction of thoracic region: Secondary | ICD-10-CM | POA: Diagnosis not present

## 2021-01-31 DIAGNOSIS — M7061 Trochanteric bursitis, right hip: Secondary | ICD-10-CM | POA: Diagnosis not present

## 2021-01-31 DIAGNOSIS — M9901 Segmental and somatic dysfunction of cervical region: Secondary | ICD-10-CM | POA: Diagnosis not present

## 2021-01-31 NOTE — Assessment & Plan Note (Signed)
Repeat injection given again today.  Tolerated the procedure well.  Discussed which activities to do which wants to avoid.  Increase activity slowly.  Hopefully this will make some benefit.  MRI of the lumbar spine was unremarkable.  Follow-up with me again in 6 weeks

## 2021-01-31 NOTE — Assessment & Plan Note (Signed)
Chronic problem with mild exacerbation.  His antibiotic ergonomics.  Discussed which activities to do which wants to avoid.  Patient has had worsening of her medications recently changed patient states that still not severe enough that stops her from activity.  Follow-up with me again 6 weeks

## 2021-01-31 NOTE — Telephone Encounter (Signed)
Pt called no answer per DPR left a voice mail letting pt know that Dr Tomi Likens is out of the office but we will let him know what is going on when he return we will call her back to let her know what he would like to do with her medication

## 2021-01-31 NOTE — Telephone Encounter (Signed)
Called in and would like a call back. She took the rizatriptan, and it is making her blood pressure high. She never has had high blood pressure and since taking this, she has had issues. Would like jaffe to see about the meds that needs PA.

## 2021-01-31 NOTE — Patient Instructions (Signed)
Good to see you GT injection today Continue exercises Have primary or neurology discuss blood pressure See me again in 6-8 weeks

## 2021-02-05 NOTE — Telephone Encounter (Signed)
Patient called and requested an update on the call.  She'd like to know if and said an amendment can be done to the PA since she tried this and it was not successful. She like to go back to Relpax.

## 2021-02-07 NOTE — Telephone Encounter (Signed)
Please send an attendement to this, if Dr. Tomi Likens is okay,thanks.

## 2021-02-08 ENCOUNTER — Other Ambulatory Visit (HOSPITAL_COMMUNITY): Payer: Self-pay

## 2021-02-15 ENCOUNTER — Other Ambulatory Visit (HOSPITAL_COMMUNITY): Payer: Self-pay

## 2021-02-15 NOTE — Telephone Encounter (Signed)
Pt said she needs an appeal. She returned christy's message, lm with AN. I called her back but she is confused as to what needs to be done.

## 2021-02-15 NOTE — Telephone Encounter (Signed)
Will send to plan.

## 2021-02-15 NOTE — Telephone Encounter (Signed)
Per shena patient needs to call pharmacy and ask for the denial to be sent to office to try another pa since we cant do an addendum or to contact insurance. Left message for patient to do this. Thanks.

## 2021-02-18 NOTE — Progress Notes (Signed)
New PA Key for PA. [name] (Key: [id number]) - [id number] Eletriptan Hydrobromide 40MG  tablets     Status: PA Request  Created: July 8th, 2022 [phone]  Sent: July 11th, 2022

## 2021-02-19 ENCOUNTER — Other Ambulatory Visit (HOSPITAL_COMMUNITY): Payer: Self-pay

## 2021-02-20 ENCOUNTER — Other Ambulatory Visit (HOSPITAL_COMMUNITY): Payer: Self-pay

## 2021-02-21 MED FILL — Erenumab-aooe Subcutaneous Soln Auto-Injector 140 MG/ML: SUBCUTANEOUS | 30 days supply | Qty: 1 | Fill #3 | Status: AC

## 2021-02-21 NOTE — Telephone Encounter (Signed)
Patient is having more, insurance denied relpax, patient needs something else per her. Plan doesn't cover it, tried to get it approved denied still. Can you give any more suggestions, she is in need of something else.

## 2021-02-21 NOTE — Telephone Encounter (Signed)
Need to call her insurance look under cover my meds for the number

## 2021-02-21 NOTE — Telephone Encounter (Signed)
Asked patient to contact office, rx denial. Patient needs to contact her plan and see what else can be given.

## 2021-02-22 ENCOUNTER — Other Ambulatory Visit (HOSPITAL_COMMUNITY): Payer: Self-pay

## 2021-02-22 ENCOUNTER — Other Ambulatory Visit: Payer: Self-pay | Admitting: Neurology

## 2021-02-22 MED ORDER — DIHYDROERGOTAMINE MESYLATE 1 MG/ML IJ SOLN
1.0000 mg | Freq: Once | INTRAMUSCULAR | 5 refills | Status: DC
  Filled 2021-02-22: qty 6, 30d supply, fill #0
  Filled 2021-02-22: qty 5, 15d supply, fill #0

## 2021-02-22 NOTE — Telephone Encounter (Signed)
Patient called requesting to speak with Alyse Low more about this matter.

## 2021-02-22 NOTE — Telephone Encounter (Signed)
I called patient and ask for her to call pharmacy,medication was sent in by [facility]

## 2021-02-22 NOTE — Telephone Encounter (Signed)
Pt called back, she still has questions regarding the medicine. She is on lunch right now.

## 2021-02-25 ENCOUNTER — Other Ambulatory Visit (HOSPITAL_COMMUNITY): Payer: Self-pay

## 2021-02-27 ENCOUNTER — Other Ambulatory Visit (HOSPITAL_COMMUNITY): Payer: Self-pay

## 2021-02-27 ENCOUNTER — Other Ambulatory Visit: Payer: Self-pay | Admitting: Physician Assistant

## 2021-02-27 MED ORDER — "BD LUER-LOK SYRINGE 25G X 1"" 3 ML MISC"
5 refills | Status: DC
  Filled 2021-02-27: qty 5, 5d supply, fill #0

## 2021-02-27 MED ORDER — LORAZEPAM 1 MG PO TABS
0.5000 mg | ORAL_TABLET | Freq: Three times a day (TID) | ORAL | 0 refills | Status: DC | PRN
  Filled 2021-02-27: qty 30, 10d supply, fill #0

## 2021-02-27 NOTE — Progress Notes (Signed)
Under a lot of stress. Sister in ER w/ appendicitis. Am renewing Rx for Ativan.

## 2021-02-28 ENCOUNTER — Telehealth: Payer: Self-pay | Admitting: Neurology

## 2021-02-28 ENCOUNTER — Other Ambulatory Visit (HOSPITAL_COMMUNITY): Payer: Self-pay

## 2021-02-28 NOTE — Telephone Encounter (Signed)
[facility] is the pharmacy

## 2021-02-28 NOTE — Telephone Encounter (Signed)
Pt called in stating she would like to try taking Ubrelvy? She says she has a savings card already, but they would still need prior authorization.

## 2021-02-28 NOTE — Telephone Encounter (Signed)
Message left for patient to return my call. Need to confirm pharmacy

## 2021-03-01 ENCOUNTER — Other Ambulatory Visit (HOSPITAL_COMMUNITY): Payer: Self-pay

## 2021-03-01 MED ORDER — UBRELVY 100 MG PO TABS
ORAL_TABLET | ORAL | 5 refills | Status: DC
  Filled 2021-03-01: qty 16, 30d supply, fill #0
  Filled 2021-04-11: qty 16, 30d supply, fill #1
  Filled 2021-05-16: qty 16, 30d supply, fill #2
  Filled 2021-08-14: qty 16, 30d supply, fill #3

## 2021-03-01 NOTE — Telephone Encounter (Signed)
Refill sent.

## 2021-03-01 NOTE — Progress Notes (Signed)
[name] (Key: [id number]) [name] '100MG'$  tablets   Form MedImpact ePA Form 2017 NCPDP Created 2 hours ago Sent to Plan 2 hours ago Plan Response 2 hours ago Submit Clinical Questions less than a minute ago Determination Wait for Determination Please wait for MedImpact 2017 to return a determination.

## 2021-03-01 NOTE — Progress Notes (Signed)
Ubrelvy '100mg'$  tablet approved for a quanity of 16 with a day supply limit of 30.approval 03/01/2021 to 08/31/2021.

## 2021-03-02 ENCOUNTER — Other Ambulatory Visit (HOSPITAL_COMMUNITY): Payer: Self-pay

## 2021-03-05 ENCOUNTER — Other Ambulatory Visit (HOSPITAL_COMMUNITY): Payer: Self-pay

## 2021-03-06 ENCOUNTER — Other Ambulatory Visit (HOSPITAL_COMMUNITY): Payer: Self-pay

## 2021-03-07 NOTE — Progress Notes (Signed)
[name] (Key: [id number]) [name] '100MG'$  tablets   Form MedImpact ePA Form 2017 NCPDP Created 6 days ago Sent to Plan 6 days ago Plan Response 6 days ago Submit Clinical Questions 6 days ago Determination Favorable 6 days ago Message from Plan The request has been approved. The authorization is effective for a maximum of 6 fills from 03/01/2021 to 08/31/2021, as long as the member is enrolled in their current health plan. This has been approved for a quantity limit of 16 with a day supply limit of 30. A written notification letter will follow with additional details.

## 2021-03-14 ENCOUNTER — Encounter: Payer: Self-pay | Admitting: Family Medicine

## 2021-03-14 ENCOUNTER — Ambulatory Visit: Payer: 59 | Admitting: Family Medicine

## 2021-03-14 ENCOUNTER — Other Ambulatory Visit: Payer: Self-pay

## 2021-03-14 VITALS — BP 110/88 | HR 85 | Ht 67.0 in | Wt 183.0 lb

## 2021-03-14 DIAGNOSIS — M9904 Segmental and somatic dysfunction of sacral region: Secondary | ICD-10-CM

## 2021-03-14 DIAGNOSIS — M9901 Segmental and somatic dysfunction of cervical region: Secondary | ICD-10-CM

## 2021-03-14 DIAGNOSIS — M9908 Segmental and somatic dysfunction of rib cage: Secondary | ICD-10-CM | POA: Diagnosis not present

## 2021-03-14 DIAGNOSIS — M9902 Segmental and somatic dysfunction of thoracic region: Secondary | ICD-10-CM | POA: Diagnosis not present

## 2021-03-14 DIAGNOSIS — G4486 Cervicogenic headache: Secondary | ICD-10-CM | POA: Diagnosis not present

## 2021-03-14 DIAGNOSIS — M9903 Segmental and somatic dysfunction of lumbar region: Secondary | ICD-10-CM | POA: Diagnosis not present

## 2021-03-14 NOTE — Patient Instructions (Signed)
See me again in 6-8 weeks ?

## 2021-03-14 NOTE — Assessment & Plan Note (Signed)
Chronic with mild exacerbation.  Patient still is taking the gabapentin.  Regularly.  Discussed icing regimen and home exercises.  Discussed avoiding certain activities.  Patient will look for anything else that would be potential stressors.  Follow-up with me again in 6 to 8 weeks

## 2021-03-14 NOTE — Progress Notes (Signed)
  [place] Phone: [phone] Subjective:   [name], am serving as a scribe for Dr. Hulan Saas.  This visit occurred during the SARS-CoV-2 public health emergency.  Safety protocols were in place, including screening questions prior to the visit, additional usage of staff PPE, and extensive cleaning of exam room while observing appropriate contact time as indicated for disinfecting solutions.    I'm seeing this patient by the request  of:  Lawerance Cruel, MD  CC: Neck pain and headache follow-up  [id number]  [name] is a 55 y.o. female coming in with complaint of back and neck pain. OMT 01/31/2021. Patient states that she has been doing well. Has had pain in back of skull but not in neck.  Patient likely has not had significant headaches though.  States that she can continue to wake up with this discomfort.  Medications patient has been prescribed: None            Past Medical History:  Diagnosis Date   History of ectopic pregnancy    04/ 2007  S/P LEFT SALPINGECTOMY   History of kidney stones    Hypothyroidism    Menorrhagia    Migraine     No Known Allergies   Review of Systems:  No headache, visual changes, nausea, vomiting, diarrhea, constipation, dizziness, abdominal pain, skin rash, fevers, chills, night sweats, weight loss, swollen lymph nodes, body aches, joint swelling, chest pain, shortness of breath, mood changes. POSITIVE muscle aches  Objective  Blood pressure 110/88, pulse 85, height '5\' 7"'$  (1.702 m), weight 183 lb (83 kg), SpO2 98 %.   General: No apparent distress alert and oriented x3 mood and affect normal, dressed appropriately.  HEENT: Pupils equal, extraocular movements intact  Respiratory: Patient's speak in full sentences and does not appear short of breath  Cardiovascular: No lower extremity edema, non tender, no erythema  Gait normal with good  balance and coordination.  MSK:  Non tender with full range of motion and good stability and symmetric strength and tone of shoulders, elbows, wrist, hip, knee and ankles bilaterally.  Back -mild tightness noted in the thoracolumbar juncture.  Patient does have mild tightness with FABER test and still some mild tenderness over the right greater trochanteric area.  Patient now has significant tightness of the neck mostly at the occipital area.  Osteopathic findings  C2 flexed rotated and side bent right C7 flexed rotated and side bent left T3 extended rotated and side bent right inhaled rib T9 extended rotated and side bent left L2 flexed rotated and side bent right Sacrum right on right       Assessment and Plan:    Nonallopathic problems  Decision today to treat with OMT was based on Physical Exam  After verbal consent patient was treated with HVLA, ME, FPR techniques in cervical, rib, thoracic, lumbar, and sacral  areas  Patient tolerated the procedure well with improvement in symptoms  Patient given exercises, stretches and lifestyle modifications  See medications in patient instructions if given  Patient will follow up in 4-8 weeks      The above documentation has been reviewed and is accurate and complete [name], DO       Note: This dictation was prepared with Dragon dictation along with smaller phrase technology. Any transcriptional errors that result from this process are unintentional.

## 2021-03-22 ENCOUNTER — Other Ambulatory Visit: Payer: Self-pay | Admitting: Physician Assistant

## 2021-03-22 ENCOUNTER — Other Ambulatory Visit (HOSPITAL_COMMUNITY): Payer: Self-pay

## 2021-03-22 MED ORDER — LORAZEPAM 1 MG PO TABS
0.5000 mg | ORAL_TABLET | Freq: Three times a day (TID) | ORAL | 1 refills | Status: DC | PRN
  Filled 2021-03-22: qty 30, 10d supply, fill #0

## 2021-03-22 MED ORDER — SERTRALINE HCL 50 MG PO TABS
50.0000 mg | ORAL_TABLET | Freq: Every day | ORAL | 1 refills | Status: DC
  Filled 2021-03-22: qty 30, 30d supply, fill #0
  Filled 2021-04-22: qty 30, 30d supply, fill #1

## 2021-03-22 MED FILL — Erenumab-aooe Subcutaneous Soln Auto-Injector 140 MG/ML: SUBCUTANEOUS | 30 days supply | Qty: 1 | Fill #4 | Status: AC

## 2021-03-26 ENCOUNTER — Other Ambulatory Visit: Payer: Self-pay

## 2021-03-26 ENCOUNTER — Other Ambulatory Visit (HOSPITAL_COMMUNITY): Payer: Self-pay

## 2021-03-26 DIAGNOSIS — G43009 Migraine without aura, not intractable, without status migrainosus: Secondary | ICD-10-CM

## 2021-03-26 MED ORDER — ERENUMAB-AOOE 140 MG/ML ~~LOC~~ SOAJ
140.0000 mg | SUBCUTANEOUS | 5 refills | Status: DC
  Filled 2021-03-26 – 2021-04-23 (×2): qty 1, 28d supply, fill #0
  Filled 2021-05-15: qty 1, 28d supply, fill #1
  Filled 2021-06-14: qty 1, 28d supply, fill #2
  Filled 2021-07-15: qty 1, 28d supply, fill #3
  Filled 2021-08-08: qty 1, 28d supply, fill #4
  Filled 2021-09-07: qty 1, 28d supply, fill #5

## 2021-04-05 DIAGNOSIS — Z6829 Body mass index (BMI) 29.0-29.9, adult: Secondary | ICD-10-CM | POA: Diagnosis not present

## 2021-04-05 DIAGNOSIS — Z01419 Encounter for gynecological examination (general) (routine) without abnormal findings: Secondary | ICD-10-CM | POA: Diagnosis not present

## 2021-04-05 DIAGNOSIS — M8580 Other specified disorders of bone density and structure, unspecified site: Secondary | ICD-10-CM | POA: Diagnosis not present

## 2021-04-05 DIAGNOSIS — Z1231 Encounter for screening mammogram for malignant neoplasm of breast: Secondary | ICD-10-CM | POA: Diagnosis not present

## 2021-04-11 ENCOUNTER — Other Ambulatory Visit (HOSPITAL_COMMUNITY): Payer: Self-pay

## 2021-04-13 ENCOUNTER — Telehealth: Payer: 59 | Admitting: Physician Assistant

## 2021-04-13 ENCOUNTER — Other Ambulatory Visit (HOSPITAL_COMMUNITY): Payer: Self-pay

## 2021-04-13 DIAGNOSIS — R3989 Other symptoms and signs involving the genitourinary system: Secondary | ICD-10-CM

## 2021-04-13 MED ORDER — CEPHALEXIN 500 MG PO CAPS
500.0000 mg | ORAL_CAPSULE | Freq: Two times a day (BID) | ORAL | 0 refills | Status: DC
  Filled 2021-04-13: qty 14, 7d supply, fill #0

## 2021-04-13 NOTE — Progress Notes (Signed)

## 2021-04-22 ENCOUNTER — Other Ambulatory Visit (HOSPITAL_COMMUNITY): Payer: Self-pay

## 2021-04-22 ENCOUNTER — Other Ambulatory Visit: Payer: Self-pay | Admitting: Neurology

## 2021-04-23 ENCOUNTER — Telehealth: Payer: Self-pay | Admitting: Neurology

## 2021-04-23 ENCOUNTER — Other Ambulatory Visit (HOSPITAL_COMMUNITY): Payer: Self-pay

## 2021-04-23 ENCOUNTER — Telehealth: Payer: Self-pay

## 2021-04-23 NOTE — Telephone Encounter (Signed)
New message    [name] (Key: [id number]) Aimovig '140MG'$ /ML auto-injectors   Form MedImpact ePA Form 2017 NCPDP Created 3 hours ago Sent to Plan 2 hours ago Plan Response 2 hours ago Submit Clinical Questions less than a minute ago Determination Wait for Determination Please wait for MedImpact 2017 to return a determination.

## 2021-04-23 NOTE — Telephone Encounter (Signed)
After reviewing the pt chart it looks like script was sent to the [facility] outpatient.  LMOVM for pt with details.

## 2021-04-23 NOTE — Telephone Encounter (Signed)
F/u   Message from Plan The request has been approved.   The authorization is effective for a maximum of 12 fills from 04/23/2021 to 04/22/2022, as long as the member is enrolled in their current health plan.   The request was approved with a quantity restriction. This has been approved for a quantity limit of 1 with a day supply limit of 30. A written notification letter will follow with additional details.

## 2021-04-24 NOTE — Progress Notes (Signed)
[name] [address] [place] Phone: [phone] Subjective:   [name], am serving as a scribe for Dr. Hulan Cook. This visit occurred during the SARS-CoV-2 public health emergency.  Safety protocols were in place, including screening questions prior to the visit, additional usage of staff PPE, and extensive cleaning of exam room while observing appropriate contact time as indicated for disinfecting solutions.   I'm seeing this patient by the request  of:  Mallory Cruel, MD  CC: Neck pain and back pain.  [id number]  [name] is a 55 y.o. female coming in with complaint of back and neck pain. OMT on 03/14/2021. Patient states is doing better. Trying her best to keep the pain at [place]. No new complaints still has tightness but does think she is making some improvement.  Continues to work on losing weight.  Patient states he is making progress at the moment.  Medications patient has been prescribed: None         Reviewed prior external information including notes and imaging from previsou exam, outside providers and external EMR if available.   As well as notes that were available from care everywhere and other healthcare systems.  Past medical history, social, surgical and family history all reviewed in electronic medical record.  No pertanent information unless stated regarding to the chief complaint.   Past Medical History:  Diagnosis Date   History of ectopic pregnancy    04/ 2007  S/P LEFT SALPINGECTOMY   History of kidney stones    Hypothyroidism    Menorrhagia    Migraine     No Known Allergies   Review of Systems:  No headache, visual changes, nausea, vomiting, diarrhea, constipation, dizziness, abdominal pain, skin rash, fevers, chills, night sweats, weight loss, swollen lymph nodes, body aches, joint swelling, chest pain, shortness of breath, mood changes. POSITIVE muscle aches  Objective   Blood pressure 140/90, pulse 74, height '5\' 7"'$  (1.702 m), weight 182 lb (82.6 kg), SpO2 99 %.   General: No apparent distress alert and oriented x3 mood and affect normal, dressed appropriately.  HEENT: Pupils equal, extraocular movements intact  Respiratory: Patient's speak in full sentences and does not appear short of breath  Cardiovascular: No lower extremity edema, non tender, no erythema  (tenderness noted in the parascapular region.  Tightness with Corky Sox noted.  Patient's neck exam does have some limited sidebending bilaterally.  Osteopathic findings  C2 flexed rotated and side bent right C6 flexed rotated and side bent left T3 extended rotated and side bent right inhaled rib T9 extended rotated and side bent left L2 flexed rotated and side bent right Sacrum right on right       Assessment and Plan:  Cervicogenic headache Chronic but stable at this moment.  Patient is doing much better overall.  Discussed icing regimen and home exercises.  Continues to respond well to osteopathic manipulation.  Patient will follow up with me again to 6-8 weeks    Nonallopathic problems  Decision today to treat with OMT was based on Physical Exam  After verbal consent patient was treated with HVLA, ME, FPR techniques in cervical, rib, thoracic, lumbar, and sacral  areas  Patient tolerated the procedure well with improvement in symptoms  Patient given exercises, stretches and lifestyle modifications  See medications in patient instructions if given  Patient will follow up in 6-8 weeks      The above documentation has been reviewed and  is accurate and complete Mallory Pulley, DO        Note: This dictation was prepared with Dragon dictation along with smaller phrase technology. Any transcriptional errors that result from this process are unintentional.

## 2021-04-25 ENCOUNTER — Other Ambulatory Visit: Payer: Self-pay

## 2021-04-25 ENCOUNTER — Ambulatory Visit: Payer: 59 | Admitting: Family Medicine

## 2021-04-25 VITALS — BP 140/90 | HR 74 | Ht 67.0 in | Wt 182.0 lb

## 2021-04-25 DIAGNOSIS — G4486 Cervicogenic headache: Secondary | ICD-10-CM | POA: Diagnosis not present

## 2021-04-25 DIAGNOSIS — M9903 Segmental and somatic dysfunction of lumbar region: Secondary | ICD-10-CM

## 2021-04-25 DIAGNOSIS — M9902 Segmental and somatic dysfunction of thoracic region: Secondary | ICD-10-CM | POA: Diagnosis not present

## 2021-04-25 DIAGNOSIS — M9908 Segmental and somatic dysfunction of rib cage: Secondary | ICD-10-CM

## 2021-04-25 DIAGNOSIS — M9901 Segmental and somatic dysfunction of cervical region: Secondary | ICD-10-CM | POA: Diagnosis not present

## 2021-04-25 DIAGNOSIS — M9904 Segmental and somatic dysfunction of sacral region: Secondary | ICD-10-CM

## 2021-04-25 NOTE — Patient Instructions (Signed)
Thanks for showing me the [place] of the weight loss. Keep up the good work See you again in 6-8 weeks

## 2021-04-25 NOTE — Assessment & Plan Note (Signed)
Chronic but stable at this moment.  Patient is doing much better overall.  Discussed icing regimen and home exercises.  Continues to respond well to osteopathic manipulation.  Patient will follow up with me again to 6-8 weeks

## 2021-05-03 DIAGNOSIS — M810 Age-related osteoporosis without current pathological fracture: Secondary | ICD-10-CM | POA: Diagnosis not present

## 2021-05-14 ENCOUNTER — Ambulatory Visit (HOSPITAL_BASED_OUTPATIENT_CLINIC_OR_DEPARTMENT_OTHER): Payer: 59 | Admitting: Cardiology

## 2021-05-15 ENCOUNTER — Other Ambulatory Visit (HOSPITAL_COMMUNITY): Payer: Self-pay

## 2021-05-16 ENCOUNTER — Other Ambulatory Visit (HOSPITAL_COMMUNITY): Payer: Self-pay

## 2021-06-11 ENCOUNTER — Other Ambulatory Visit (HOSPITAL_COMMUNITY): Payer: Self-pay

## 2021-06-11 DIAGNOSIS — S0502XA Injury of conjunctiva and corneal abrasion without foreign body, left eye, initial encounter: Secondary | ICD-10-CM | POA: Diagnosis not present

## 2021-06-11 MED ORDER — KETOROLAC TROMETHAMINE 0.5 % OP SOLN
OPHTHALMIC | 0 refills | Status: DC
  Filled 2021-06-11: qty 5, 16d supply, fill #0

## 2021-06-11 MED ORDER — TOBRAMYCIN 0.3 % OP SOLN
OPHTHALMIC | 0 refills | Status: DC
  Filled 2021-06-11: qty 5, 30d supply, fill #0

## 2021-06-13 ENCOUNTER — Ambulatory Visit: Payer: 59 | Admitting: Sports Medicine

## 2021-06-13 DIAGNOSIS — S0502XD Injury of conjunctiva and corneal abrasion without foreign body, left eye, subsequent encounter: Secondary | ICD-10-CM | POA: Diagnosis not present

## 2021-06-14 ENCOUNTER — Other Ambulatory Visit (HOSPITAL_COMMUNITY): Payer: Self-pay

## 2021-06-24 ENCOUNTER — Ambulatory Visit: Payer: 59 | Admitting: Physician Assistant

## 2021-06-25 ENCOUNTER — Telehealth: Payer: 59 | Admitting: Physician Assistant

## 2021-06-25 DIAGNOSIS — N898 Other specified noninflammatory disorders of vagina: Secondary | ICD-10-CM

## 2021-06-25 DIAGNOSIS — R102 Pelvic and perineal pain: Secondary | ICD-10-CM

## 2021-06-25 DIAGNOSIS — R399 Unspecified symptoms and signs involving the genitourinary system: Secondary | ICD-10-CM

## 2021-06-25 NOTE — Progress Notes (Signed)
Based on what you shared with me, I feel your condition warrants further evaluation and I recommend that you be seen in a face to face visit. I am concerned about the possibility of both a UTI and a vaginitis caused from separate issues. I would recommend you be seen in person so you can get a urinalysis/culture to check what bacteria is causing bladder infection, and a urine/vaginal swab to determine the culprit of the vaginal infection (yeast versus BV, etc). Because these are all treated differently, over treating or treating improperly can cause a worsening of symptoms or additional infections.    NOTE: There will be NO CHARGE for this eVisit   If you are having a true medical emergency please call 911.      For an urgent face to face visit, [facility] has six urgent care centers for your convenience:     [facility] [place] [facility] [place] Get Driving Directions [phone] [place], [place] [address]    [facility]) Get Driving Directions [phone] [place], [place] [address]  [facility] [place] ([place]) Get Driving Directions [phone] [address] [place],  [place]  [address]  [facility] Get Driving Directions [phone] [place], [place], [place] [address]   [facility] Get Driving Directions  [phone] [address] [place], [place] [address]   [facility] at  Get Driving Directions [phone] [address] [place], [place] [address]  Your MyChart E-visit questionnaire answers were reviewed by a board certified advanced clinical practitioner to complete your personal care plan based on your specific symptoms.  Thank you for using e-Visits.

## 2021-06-25 NOTE — Progress Notes (Signed)
Message sent to patient requesting further input regarding current symptoms. Awaiting patient response.  

## 2021-06-26 ENCOUNTER — Other Ambulatory Visit (HOSPITAL_COMMUNITY): Payer: Self-pay

## 2021-06-26 ENCOUNTER — Telehealth: Payer: 59 | Admitting: Physician Assistant

## 2021-06-26 DIAGNOSIS — N760 Acute vaginitis: Secondary | ICD-10-CM | POA: Diagnosis not present

## 2021-06-26 DIAGNOSIS — B9689 Other specified bacterial agents as the cause of diseases classified elsewhere: Secondary | ICD-10-CM | POA: Diagnosis not present

## 2021-06-26 MED ORDER — METRONIDAZOLE 500 MG PO TABS
500.0000 mg | ORAL_TABLET | Freq: Two times a day (BID) | ORAL | 0 refills | Status: DC
  Filled 2021-06-26: qty 14, 7d supply, fill #0

## 2021-06-26 NOTE — Progress Notes (Signed)
I have spent 5 minutes in review of e-visit questionnaire, review and updating patient chart, medical decision making and response to patient.   Kaiea Esselman Cody Jerelyn Trimarco, PA-C    

## 2021-06-26 NOTE — Progress Notes (Signed)
E-Visit for Vaginal Symptoms  We are sorry that you are not feeling well. Here is how we plan to help!  Again as discussed yesterday giving the combination of symptoms it is recommended you be seen in person so you can get testing to determine what it the root cause of symptoms or if multiple issues present. Since you are sending a visit request again I will go ahead and start treatment for the vaginal discharge symptoms but if not resolving you have to be seen in person.   Based on what you shared with me it looks like you: May have a vaginosis due to bacteria -- this typically causes the odorous vaginal discharge and discomfort whereas yeast usually is more of a thick discharge with less of an odor.   Vaginosis is an inflammation of the vagina that can result in discharge, itching and pain. The cause is usually a change in the normal balance of vaginal bacteria or an infection. Vaginosis can also result from reduced estrogen levels after menopause.  The most common causes of vaginosis are:   Bacterial vaginosis which results from an overgrowth of one on several organisms that are normally present in your vagina.   Yeast infections which are caused by a naturally occurring fungus called candida.   Vaginal atrophy (atrophic vaginosis) which results from the thinning of the vagina from reduced estrogen levels after menopause.   Trichomoniasis which is caused by a parasite and is commonly transmitted by sexual intercourse.  Factors that increase your risk of developing vaginosis include: Medications, such as antibiotics and steroids Uncontrolled diabetes Use of hygiene products such as bubble bath, vaginal spray or vaginal deodorant Douching Wearing damp or tight-fitting clothing Using an intrauterine device (IUD) for birth control Hormonal changes, such as those associated with pregnancy, birth control pills or menopause Sexual activity Having a sexually transmitted infection  Your  treatment plan is Metronidazole or Flagyl 500mg  twice a day for 7 days.  I have electronically sent this prescription into the pharmacy that you have chosen.  Be sure to take all of the medication as directed. Stop taking any medication if you develop a rash, tongue swelling or shortness of breath. Mothers who are breast feeding should consider pumping and discarding their breast milk while on these antibiotics. However, there is no consensus that infant exposure at these doses would be harmful.  Remember that medication creams can weaken latex condoms. Marland Kitchen   HOME CARE:  Good hygiene may prevent some types of vaginosis from recurring and may relieve some symptoms:  Avoid baths, hot tubs and whirlpool spas. Rinse soap from your outer genital area after a shower, and dry the area well to prevent irritation. Don't use scented or harsh soaps, such as those with deodorant or antibacterial action. Avoid irritants. These include scented tampons and pads. Wipe from front to back after using the toilet. Doing so avoids spreading fecal bacteria to your vagina.  Other things that may help prevent vaginosis include:  Don't douche. Your vagina doesn't require cleansing other than normal bathing. Repetitive douching disrupts the normal organisms that reside in the vagina and can actually increase your risk of vaginal infection. Douching won't clear up a vaginal infection. Use a latex condom. Both female and female latex condoms may help you avoid infections spread by sexual contact. Wear cotton underwear. Also wear pantyhose with a cotton crotch. If you feel comfortable without it, skip wearing underwear to bed. Yeast thrives in [employer] Your symptoms should improve  in the next day or two.  GET HELP RIGHT AWAY IF:  You have pain in your lower abdomen ( pelvic area or over your ovaries) You develop nausea or vomiting You develop a fever Your discharge changes or worsens You have persistent pain  with intercourse You develop shortness of breath, a rapid pulse, or you faint.  These symptoms could be signs of problems or infections that need to be evaluated by a medical provider now.  MAKE SURE YOU   Understand these instructions. Will watch your condition. Will get help right away if you are not doing well or get worse.  Thank you for choosing an e-visit.  Your e-visit answers were reviewed by a board certified advanced clinical practitioner to complete your personal care plan. Depending upon the condition, your plan could have included both over the counter or prescription medications.  Please review your pharmacy choice. Make sure the pharmacy is open so you can pick up prescription now. If there is a problem, you may contact your provider through [employer] and have the prescription routed to another pharmacy.  Your safety is important to [place]. If you have drug allergies check your prescription carefully.   For the next 24 hours you can use MyChart to ask questions about today's visit, request a non-urgent call back, or ask for a work or school excuse. You will get an email in the next two days asking about your experience. I hope that your e-visit has been valuable and will speed your recovery.

## 2021-07-06 ENCOUNTER — Other Ambulatory Visit (HOSPITAL_COMMUNITY): Payer: Self-pay

## 2021-07-06 DIAGNOSIS — N760 Acute vaginitis: Secondary | ICD-10-CM | POA: Diagnosis not present

## 2021-07-06 DIAGNOSIS — B9689 Other specified bacterial agents as the cause of diseases classified elsewhere: Secondary | ICD-10-CM | POA: Diagnosis not present

## 2021-07-06 DIAGNOSIS — R3989 Other symptoms and signs involving the genitourinary system: Secondary | ICD-10-CM | POA: Diagnosis not present

## 2021-07-06 DIAGNOSIS — R1030 Lower abdominal pain, unspecified: Secondary | ICD-10-CM | POA: Diagnosis not present

## 2021-07-06 MED ORDER — METRONIDAZOLE 0.75 % VA GEL
1.0000 | Freq: Every evening | VAGINAL | 0 refills | Status: DC
  Filled 2021-07-06: qty 70, 7d supply, fill #0

## 2021-07-15 ENCOUNTER — Other Ambulatory Visit (HOSPITAL_COMMUNITY): Payer: Self-pay

## 2021-07-26 NOTE — Progress Notes (Signed)
[name] [place] [address] [place] Phone: [phone] Subjective:   [name], am serving as a scribe for Dr. Hulan Saas. This visit occurred during the SARS-CoV-2 public health emergency.  Safety protocols were in place, including screening questions prior to the visit, additional usage of staff PPE, and extensive cleaning of exam room while observing appropriate contact time as indicated for disinfecting solutions.   I'm seeing this patient by the request  of:  Lawerance Cruel, MD  CC: Back pain intermittent follow-up with neck pain as well with  FBP:[id number]  [name] is a 55 y.o. female coming in with complaint of back and neck pain. OMT on 04/25/2021. Patient states hurt all over. Tight all over and getting more headaches because of it. No new complaints.  Patient feels that is just because she has not been here recently.  Medications patient has been prescribed: None  Taking:         Reviewed prior external information including notes and imaging from previsou exam, outside providers and external EMR if available.   As well as notes that were available from care everywhere and other healthcare systems.  Past medical history, social, surgical and family history all reviewed in electronic medical record.  No pertanent information unless stated regarding to the chief complaint.   Past Medical History:  Diagnosis Date   History of ectopic pregnancy    04/ 2007  S/P LEFT SALPINGECTOMY   History of kidney stones    Hypothyroidism    Menorrhagia    Migraine     No Known Allergies   Review of Systems:  No headache, visual changes, nausea, vomiting, diarrhea, constipation, dizziness, abdominal pain, skin rash, fevers, chills, night sweats, weight loss, swollen lymph nodes, body aches, joint swelling, chest pain, shortness of breath, mood changes. POSITIVE muscle aches  Objective  Blood pressure 132/90, pulse  86, height 5\' 7"  (1.702 m), weight 191 lb (86.6 kg), SpO2 98 %.   General: No apparent distress alert and oriented x3 mood and affect normal, dressed appropriately.  HEENT: Pupils equal, extraocular movements intact  Respiratory: Patient's speak in full sentences and does not appear short of breath  Cardiovascular: No lower extremity edema, non tender, no erythema  Neck exam does have some loss of lordosis.  Some tenderness to palpation in the paraspinal musculature.  Patient does have some mild limited sidebending.  Tightness noted in the parascapular region right greater than left.  Low back exam tightness on the right sacroiliac joint.  Negative straight leg test  Osteopathic findings  C2 flexed rotated and side bent right C6 flexed rotated and side bent left T3 extended rotated and side bent right inhaled rib T9 extended rotated and side bent left L2 flexed rotated and side bent right Sacrum right on right       Assessment and Plan:  Chronic problem with exacerbation.  Has been no longer duration than usual.  Discussed icing regimen and home exercises.  Responded extremely well though to osteopathic manipulation.  Increase activity slowly.  Follow-up again in 6 to 8 weeks  Nonallopathic problems  Decision today to treat with OMT was based on Physical Exam  After verbal consent patient was treated with HVLA, ME, FPR techniques in cervical, rib, thoracic, lumbar, and sacral  areas  Patient tolerated the procedure well with improvement in symptoms  Patient given exercises, stretches and lifestyle modifications  See medications in patient instructions if given  Patient will follow up in 4-8 weeks     The above documentation has been reviewed and is accurate and complete Mallory Pulley, DO        Note: This dictation was prepared with Dragon dictation along with smaller phrase technology. Any transcriptional errors that result from this process are unintentional.

## 2021-07-29 ENCOUNTER — Ambulatory Visit: Payer: 59 | Admitting: Family Medicine

## 2021-07-29 ENCOUNTER — Other Ambulatory Visit: Payer: Self-pay

## 2021-07-29 VITALS — BP 132/90 | HR 86 | Ht 67.0 in | Wt 191.0 lb

## 2021-07-29 DIAGNOSIS — M9903 Segmental and somatic dysfunction of lumbar region: Secondary | ICD-10-CM | POA: Diagnosis not present

## 2021-07-29 DIAGNOSIS — G4486 Cervicogenic headache: Secondary | ICD-10-CM

## 2021-07-29 DIAGNOSIS — M9904 Segmental and somatic dysfunction of sacral region: Secondary | ICD-10-CM

## 2021-07-29 DIAGNOSIS — M9908 Segmental and somatic dysfunction of rib cage: Secondary | ICD-10-CM | POA: Diagnosis not present

## 2021-07-29 DIAGNOSIS — M9902 Segmental and somatic dysfunction of thoracic region: Secondary | ICD-10-CM | POA: Diagnosis not present

## 2021-07-29 DIAGNOSIS — M9901 Segmental and somatic dysfunction of cervical region: Secondary | ICD-10-CM

## 2021-07-29 NOTE — Assessment & Plan Note (Signed)
.  Chronic problem with exacerbation.  Has been no longer duration than usual.  Discussed icing regimen and home exercises.  Responded extremely well though to osteopathic manipulation.  Increase activity slowly.  Follow-up again in 6 to 8 weeks

## 2021-07-29 NOTE — Patient Instructions (Signed)
Thanks for showing me the [place] of [place] Have a wonderful holiday season See you again in 7-8 weeks

## 2021-08-01 ENCOUNTER — Ambulatory Visit: Payer: 59 | Admitting: Family Medicine

## 2021-08-08 ENCOUNTER — Other Ambulatory Visit (HOSPITAL_COMMUNITY): Payer: Self-pay

## 2021-08-09 ENCOUNTER — Telehealth: Payer: 59 | Admitting: Physician Assistant

## 2021-08-09 DIAGNOSIS — J029 Acute pharyngitis, unspecified: Secondary | ICD-10-CM

## 2021-08-09 NOTE — Progress Notes (Signed)
°  E-Visit for Sore Throat  We are sorry that you are not feeling well.  Here is how we plan to help!  Your symptoms indicate a likely viral infection (Pharyngitis).   Pharyngitis is inflammation in the back of the throat which can cause a sore throat, scratchiness and sometimes difficulty swallowing.   Pharyngitis is typically caused by a respiratory virus and will just run its course.  Please keep in mind that your symptoms could last up to 10 days.  For throat pain, we recommend over the counter oral pain relief medications such as acetaminophen or aspirin, or anti-inflammatory medications such as ibuprofen or naproxen sodium.  Topical treatments such as oral throat lozenges or sprays may be used as needed.  Avoid close contact with loved ones, especially the very young and elderly.  Remember to wash your hands thoroughly throughout the day as this is the number one way to prevent the spread of infection and wipe down door knobs and counters with disinfectant.  After careful review of your answers, I would not recommend an antibiotic for your condition.  Antibiotics should not be used to treat conditions that we suspect are caused by viruses like the virus that causes the common cold or flu. However, some people can have Strep with atypical symptoms. You may need formal testing in clinic or office to confirm if your symptoms continue or worsen.  Providers prescribe antibiotics to treat infections caused by bacteria. Antibiotics are very powerful in treating bacterial infections when they are used properly.  To maintain their effectiveness, they should be used only when necessary.  Overuse of antibiotics has resulted in the development of super bugs that are resistant to treatment!    Home Care: Only take medications as instructed by your medical team. Do not drink alcohol while taking these medications. A steam or ultrasonic humidifier can help congestion.  You can place a towel over your head and  breathe in the steam from hot water coming from a faucet. Avoid close contacts especially the very young and the elderly. Cover your mouth when you cough or sneeze. Always remember to wash your hands.  Get Help Right Away If: You develop worsening fever or throat pain. You develop a severe head ache or visual changes. Your symptoms persist after you have completed your treatment plan.  Make sure you Understand these instructions. Will watch your condition. Will get help right away if you are not doing well or get worse.   Thank you for choosing an e-visit.  Your e-visit answers were reviewed by a board certified advanced clinical practitioner to complete your personal care plan. Depending upon the condition, your plan could have included both over the counter or prescription medications.  Please review your pharmacy choice. Make sure the pharmacy is open so you can pick up prescription now. If there is a problem, you may contact your provider through [employer] and have the prescription routed to another pharmacy.  Your safety is important to [place]. If you have drug allergies check your prescription carefully.   For the next 24 hours you can use MyChart to ask questions about today's visit, request a non-urgent call back, or ask for a work or school excuse. You will get an email in the next two days asking about your experience. I hope that your e-visit has been valuable and will speed your recovery.  I provided 5 minutes of non face-to-face time during this encounter for chart review and documentation.

## 2021-08-12 ENCOUNTER — Telehealth: Payer: 59 | Admitting: Physician Assistant

## 2021-08-12 ENCOUNTER — Other Ambulatory Visit (HOSPITAL_COMMUNITY): Payer: Self-pay

## 2021-08-12 DIAGNOSIS — J069 Acute upper respiratory infection, unspecified: Secondary | ICD-10-CM

## 2021-08-12 MED ORDER — FLUTICASONE PROPIONATE 50 MCG/ACT NA SUSP
2.0000 | Freq: Every day | NASAL | 0 refills | Status: DC
  Filled 2021-08-12: qty 16, 30d supply, fill #0

## 2021-08-12 MED ORDER — BENZONATATE 100 MG PO CAPS
100.0000 mg | ORAL_CAPSULE | Freq: Three times a day (TID) | ORAL | 0 refills | Status: DC | PRN
  Filled 2021-08-12: qty 30, 10d supply, fill #0

## 2021-08-12 NOTE — Progress Notes (Signed)
I have spent 5 minutes in review of e-visit questionnaire, review and updating patient chart, medical decision making and response to patient.   Kaulana Brindle Cody Dacotah [place], PA-C    

## 2021-08-12 NOTE — Progress Notes (Signed)

## 2021-08-14 ENCOUNTER — Other Ambulatory Visit (HOSPITAL_COMMUNITY): Payer: Self-pay

## 2021-08-17 ENCOUNTER — Other Ambulatory Visit (HOSPITAL_COMMUNITY): Payer: Self-pay

## 2021-08-21 ENCOUNTER — Telehealth: Payer: Self-pay

## 2021-08-21 ENCOUNTER — Other Ambulatory Visit (HOSPITAL_COMMUNITY): Payer: Self-pay

## 2021-08-21 DIAGNOSIS — R059 Cough, unspecified: Secondary | ICD-10-CM | POA: Diagnosis not present

## 2021-08-21 DIAGNOSIS — R110 Nausea: Secondary | ICD-10-CM | POA: Diagnosis not present

## 2021-08-21 DIAGNOSIS — U071 COVID-19: Secondary | ICD-10-CM | POA: Diagnosis not present

## 2021-08-21 DIAGNOSIS — R509 Fever, unspecified: Secondary | ICD-10-CM | POA: Diagnosis not present

## 2021-08-21 MED ORDER — PROMETHAZINE-DM 6.25-15 MG/5ML PO SYRP
ORAL_SOLUTION | ORAL | 0 refills | Status: DC
  Filled 2021-08-21: qty 140, 7d supply, fill #0

## 2021-08-21 MED ORDER — PAXLOVID (300/100) 20 X 150 MG & 10 X 100MG PO TBPK
ORAL_TABLET | ORAL | 0 refills | Status: DC
  Filled 2021-08-21: qty 30, 5d supply, fill #0

## 2021-08-21 NOTE — Telephone Encounter (Signed)
New message  Your information has been sent to [place].  Ledora Bottcher Key: [facility] - PA Case ID: [id number] help? Call us at [phone] Status Sent to [place] 100MG  tablets Form [employer] ePA Form 2017 NCPDP

## 2021-08-22 NOTE — Telephone Encounter (Signed)
F/u  [name] Key: [facility] - [place] Case ID: [id number] help? Call us at [phone] Outcome Approvedon January 11 The request has been approved. The authorization is effective for a maximum of 12 fills from 08/21/2021 to 08/20/2022, as long as the member is enrolled in their current health plan. The request was approved with a quantity restriction. This has been approved for a quantity limit of 16 with a day supply limit of 30. A written notification letter will follow with additional details. Drug [place]rel[place] 100MG  tablets Form [employer] ePA Form 2017 NCPDP

## 2021-08-29 ENCOUNTER — Other Ambulatory Visit: Payer: Self-pay | Admitting: Physician Assistant

## 2021-08-29 ENCOUNTER — Other Ambulatory Visit (HOSPITAL_COMMUNITY): Payer: Self-pay

## 2021-08-29 MED ORDER — LORAZEPAM 1 MG PO TABS
0.5000 mg | ORAL_TABLET | Freq: Three times a day (TID) | ORAL | 1 refills | Status: DC | PRN
Start: 2021-08-29 — End: 2021-10-22
  Filled 2021-08-29: qty 30, 10d supply, fill #0
  Filled 2021-09-19: qty 30, 10d supply, fill #1

## 2021-09-07 ENCOUNTER — Other Ambulatory Visit (HOSPITAL_COMMUNITY): Payer: Self-pay
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
## 2021-09-12 ENCOUNTER — Other Ambulatory Visit (HOSPITAL_COMMUNITY): Payer: Self-pay

## 2021-09-12 ENCOUNTER — Other Ambulatory Visit: Payer: Self-pay

## 2021-09-12 ENCOUNTER — Ambulatory Visit: Payer: 59 | Admitting: Neurology

## 2021-09-12 ENCOUNTER — Encounter: Payer: Self-pay | Admitting: Neurology

## 2021-09-12 DIAGNOSIS — G43009 Migraine without aura, not intractable, without status migrainosus: Secondary | ICD-10-CM

## 2021-09-12 MED ORDER — UBRELVY 100 MG PO TABS
ORAL_TABLET | ORAL | 11 refills | Status: DC
  Filled 2021-09-12: qty 16, 8d supply, fill #0
  Filled 2021-11-26: qty 16, 30d supply, fill #0
  Filled 2022-02-12: qty 16, 30d supply, fill #1
  Filled 2022-04-10: qty 16, 30d supply, fill #2
  Filled 2022-06-04: qty 16, 30d supply, fill #3
  Filled 2022-07-01: qty 16, 30d supply, fill #4
  Filled 2022-08-07: qty 16, 30d supply, fill #5
  Filled 2022-09-12: qty 16, 30d supply, fill #6

## 2021-09-12 MED ORDER — ERENUMAB-AOOE 140 MG/ML ~~LOC~~ SOAJ
140.0000 mg | SUBCUTANEOUS | 11 refills | Status: DC
  Filled 2021-09-12 – 2021-10-03 (×2): qty 1, 28d supply, fill #0
  Filled 2021-10-25: qty 1, 28d supply, fill #1
  Filled 2021-11-27: qty 1, 28d supply, fill #2
  Filled 2021-12-26: qty 1, 28d supply, fill #3
  Filled 2022-01-23: qty 1, 28d supply, fill #4
  Filled 2022-02-14: qty 1, 28d supply, fill #5
  Filled 2022-03-14: qty 1, 28d supply, fill #6
  Filled 2022-04-10: qty 1, 30d supply, fill #7
  Filled 2022-05-12: qty 1, 30d supply, fill #8
  Filled 2022-06-04: qty 1, 30d supply, fill #9
  Filled 2022-06-27 – 2022-07-01 (×2): qty 1, 30d supply, fill #10
  Filled 2022-07-18 – 2022-07-30 (×2): qty 1, 30d supply, fill #11

## 2021-09-12 NOTE — Patient Instructions (Signed)
Refilled Aimovig and Ubrelvy with 11 additional refills Follow up in one year

## 2021-09-17 NOTE — Progress Notes (Signed)
[place] Phone: [phone] Subjective:   [name], am serving as a scribe for Dr. Hulan Saas.  This visit occurred during the SARS-CoV-2 public health emergency.  Safety protocols were in place, including screening questions prior to the visit, additional usage of staff PPE, and extensive cleaning of exam room while observing appropriate contact time as indicated for disinfecting solutions.  I'm seeing this patient by the request  of:  Lawerance Cruel, MD  CC: Neck pain follow-up  [id number]  [name] is a 56 y.o. female coming in with complaint of back and neck pain. OMT on 07/29/2021. Patient states that her neck is sore for past 2 days.   Medications patient has been prescribed: None          Reviewed prior external information including notes and imaging from previsou exam, outside providers and external EMR if available.   As well as notes that were available from care everywhere and other healthcare systems.  Past medical history, social, surgical and family history all reviewed in electronic medical record.  No pertanent information unless stated regarding to the chief complaint.   Past Medical History:  Diagnosis Date   History of ectopic pregnancy    04/ 2007  S/P LEFT SALPINGECTOMY   History of kidney stones    Hypothyroidism    Menorrhagia    Migraine     No Known Allergies   Review of Systems:  No headache, visual changes, nausea, vomiting, diarrhea, constipation, dizziness, abdominal pain, skin rash, fevers, chills, night sweats, weight loss, swollen lymph nodes, body aches, joint swelling, chest pain, shortness of breath, mood changes. POSITIVE muscle aches  Objective  Blood pressure (!) 136/100, pulse 84, height 5\' 7"  (1.702 m), weight 186 lb (84.4 kg), SpO2 98 %.   General: No apparent distress alert and oriented x3 mood and affect normal, dressed  appropriately.  HEENT: Pupils equal, extraocular movements intact  Respiratory: Patient's speak in full sentences and does not appear short of breath  Cardiovascular: No lower extremity edema, non tender, no erythema  Neck exam does have some loss lordosis.  Tightness still noted on the paraspinal musculature of the parascapular region bilaterally.  Osteopathic findings  C2 flexed rotated and side bent right C6 flexed rotated and side bent left T3 extended rotated and side bent right inhaled rib T5 extended rotated and side bent right inhaled rib T7 extended rotated and side bent left L1 flexed rotated and side bent right Sacrum right on right       Assessment and Plan:  Cervicogenic headache Chronic problem.  Mild exacerbation.  Patient has done relatively well with the new medications.  Patient has lost some weight which I think is also helping her.  Encouraged her to continue to do so.  Follow-up with me again in 6 to 8 weeks.   Nonallopathic problems  Decision today to treat with OMT was based on Physical Exam  After verbal consent patient was treated with HVLA, ME, FPR techniques in cervical, rib, thoracic, lumbar, and sacral  areas  Patient tolerated the procedure well with improvement in symptoms  Patient given exercises, stretches and lifestyle modifications  See medications in patient instructions if given  Patient will follow up in 4-8 weeks      The above documentation has been reviewed and is accurate and complete [name], DO       Note: This dictation was prepared  with Dragon dictation along with smaller phrase technology. Any transcriptional errors that result from this process are unintentional.

## 2021-09-18 ENCOUNTER — Other Ambulatory Visit: Payer: Self-pay

## 2021-09-18 ENCOUNTER — Encounter: Payer: Self-pay | Admitting: Family Medicine

## 2021-09-18 ENCOUNTER — Ambulatory Visit: Payer: 59 | Admitting: Family Medicine

## 2021-09-18 VITALS — BP 136/100 | HR 84 | Ht 67.0 in | Wt 186.0 lb

## 2021-09-18 DIAGNOSIS — M9901 Segmental and somatic dysfunction of cervical region: Secondary | ICD-10-CM

## 2021-09-18 DIAGNOSIS — M9904 Segmental and somatic dysfunction of sacral region: Secondary | ICD-10-CM | POA: Diagnosis not present

## 2021-09-18 DIAGNOSIS — M9902 Segmental and somatic dysfunction of thoracic region: Secondary | ICD-10-CM

## 2021-09-18 DIAGNOSIS — M9903 Segmental and somatic dysfunction of lumbar region: Secondary | ICD-10-CM

## 2021-09-18 DIAGNOSIS — G4486 Cervicogenic headache: Secondary | ICD-10-CM | POA: Diagnosis not present

## 2021-09-18 DIAGNOSIS — M9908 Segmental and somatic dysfunction of rib cage: Secondary | ICD-10-CM | POA: Diagnosis not present

## 2021-09-18 NOTE — Patient Instructions (Signed)
Always good to see you Better than last time I like your plan with the dishes See me in 7-8 weeks

## 2021-09-18 NOTE — Assessment & Plan Note (Signed)
Chronic problem.  Mild exacerbation.  Patient has done relatively well with the new medications.  Patient has lost some weight which I think is also helping her.  Encouraged her to continue to do so.  Follow-up with me again in 6 to 8 weeks.

## 2021-09-20 ENCOUNTER — Other Ambulatory Visit (HOSPITAL_COMMUNITY): Payer: Self-pay

## 2021-09-21 ENCOUNTER — Other Ambulatory Visit (HOSPITAL_COMMUNITY): Payer: Self-pay

## 2021-09-25 ENCOUNTER — Other Ambulatory Visit (HOSPITAL_COMMUNITY): Payer: Self-pay

## 2021-09-25 ENCOUNTER — Telehealth: Payer: 59 | Admitting: Physician Assistant

## 2021-09-25 ENCOUNTER — Telehealth: Payer: 59 | Admitting: Family

## 2021-09-25 DIAGNOSIS — R102 Pelvic and perineal pain: Secondary | ICD-10-CM

## 2021-09-25 DIAGNOSIS — R3989 Other symptoms and signs involving the genitourinary system: Secondary | ICD-10-CM | POA: Diagnosis not present

## 2021-09-25 MED ORDER — CEPHALEXIN 500 MG PO CAPS
500.0000 mg | ORAL_CAPSULE | Freq: Two times a day (BID) | ORAL | 0 refills | Status: AC
  Filled 2021-09-25: qty 14, 7d supply, fill #0

## 2021-09-25 NOTE — Progress Notes (Signed)
I have spent 5 minutes in review of e-visit questionnaire, review and updating patient chart, medical decision making and response to patient.   Kota Ciancio Cody Alynah Schone, PA-C    

## 2021-09-25 NOTE — Progress Notes (Addendum)
Giving history of UTI and mention this being identical to previous infections, I will empirically treat for a suspected bladder infection -- see below. If anything is worsening despite treatment and/or symptoms are not fully resolving, you will need an in-person evaluation. Please do not delay care.    E-Visit for Urinary Problems  We are sorry that you are not feeling well.  Here is how we plan to help!  Based on what you shared with me it looks like you most likely have a simple urinary tract infection.  A UTI (Urinary Tract Infection) is a bacterial infection of the bladder.  Most cases of urinary tract infections are simple to treat but a key part of your care is to encourage you to drink plenty of fluids and watch your symptoms carefully.  I have prescribed Keflex 500 mg twice a day for 7 days.  Your symptoms should gradually improve. Call us if the burning in your urine worsens, you develop worsening fever, back pain or pelvic pain or if your symptoms do not resolve after completing the antibiotic.  Urinary tract infections can be prevented by drinking plenty of water to keep your body hydrated.  Also be sure when you wipe, wipe from front to back and don't hold it in!  If possible, empty your bladder every 4 hours.  HOME CARE Drink plenty of fluids Compete the full course of the antibiotics even if the symptoms resolve Remember, when you need to gogo. Holding in your urine can increase the likelihood of getting a UTI! GET HELP RIGHT AWAY IF: You cannot urinate You get a high fever Worsening back pain occurs You see blood in your urine You feel sick to your stomach or throw up You feel like you are going to pass out  MAKE SURE YOU  Understand these instructions. Will watch your condition. Will get help right away if you are not doing well or get worse.   Thank you for choosing an e-visit.  Your e-visit answers were reviewed by a board certified advanced clinical  practitioner to complete your personal care plan. Depending upon the condition, your plan could have included both over the counter or prescription medications.  Please review your pharmacy choice. Make sure the pharmacy is open so you can pick up prescription now. If there is a problem, you may contact your provider through [employer] and have the prescription routed to another pharmacy.  Your safety is important to [place]. If you have drug allergies check your prescription carefully.   For the next 24 hours you can use MyChart to ask questions about today's visit, request a non-urgent call back, or ask for a work or school excuse. You will get an email in the next two days asking about your experience. I hope that your e-visit has been valuable and will speed your recovery.

## 2021-09-25 NOTE — Progress Notes (Signed)
Based on what you shared with me, I feel your condition warrants further evaluation and I recommend that you be seen in a face to face visit.  Given you are having vaginal pain and belly pain you need to be seen in person to rule out another infection.    NOTE: There will be NO CHARGE for this eVisit   If you are having a true medical emergency please call 911.      For an urgent face to face visit, [facility] has six urgent care centers for your convenience:     [facility] [place] [facility] [place] Get Driving Directions [phone] [place], [place] [address]    [facility]) Get Driving Directions [phone] [place], [place] [address]  [facility] [place] [facility]) Get Driving Directions [phone] [address] [place],  [place]  [address]  [facility] Get Driving Directions [phone] [place], [place], [place] [address]   [facility] Get Driving Directions  [phone] [address] [place], [place] [address]   [facility] [place] Get Driving Directions [phone] [address], [place], [place] [address]  Your MyChart E-visit questionnaire answers were reviewed by a board certified advanced clinical practitioner to complete your personal care plan based on your specific symptoms.  Thank you for using e-Visits.

## 2021-09-25 NOTE — Addendum Note (Signed)
Addended by: Raiford Noble C on: 09/25/2021 04:00 PM   Modules accepted: Orders

## 2021-10-03 ENCOUNTER — Other Ambulatory Visit (HOSPITAL_COMMUNITY): Payer: Self-pay

## 2021-10-16 ENCOUNTER — Ambulatory Visit: Payer: 59 | Admitting: Neurology

## 2021-10-18 DIAGNOSIS — R5383 Other fatigue: Secondary | ICD-10-CM | POA: Diagnosis not present

## 2021-10-18 DIAGNOSIS — R030 Elevated blood-pressure reading, without diagnosis of hypertension: Secondary | ICD-10-CM | POA: Diagnosis not present

## 2021-10-18 DIAGNOSIS — R3915 Urgency of urination: Secondary | ICD-10-CM | POA: Diagnosis not present

## 2021-10-18 DIAGNOSIS — M791 Myalgia, unspecified site: Secondary | ICD-10-CM | POA: Diagnosis not present

## 2021-10-18 DIAGNOSIS — R300 Dysuria: Secondary | ICD-10-CM | POA: Diagnosis not present

## 2021-10-18 DIAGNOSIS — Z8249 Family history of ischemic heart disease and other diseases of the circulatory system: Secondary | ICD-10-CM | POA: Diagnosis not present

## 2021-10-22 ENCOUNTER — Other Ambulatory Visit (HOSPITAL_COMMUNITY): Payer: Self-pay

## 2021-10-22 ENCOUNTER — Other Ambulatory Visit: Payer: Self-pay | Admitting: Physician Assistant

## 2021-10-22 MED ORDER — LORAZEPAM 1 MG PO TABS
0.5000 mg | ORAL_TABLET | Freq: Three times a day (TID) | ORAL | 2 refills | Status: DC | PRN
  Filled 2021-10-22: qty 30, 10d supply, fill #0
  Filled 2021-11-15: qty 30, 10d supply, fill #1
  Filled 2021-12-10: qty 30, 10d supply, fill #2

## 2021-10-25 ENCOUNTER — Other Ambulatory Visit (HOSPITAL_COMMUNITY): Payer: Self-pay

## 2021-10-28 ENCOUNTER — Other Ambulatory Visit (HOSPITAL_COMMUNITY): Payer: Self-pay

## 2021-10-29 ENCOUNTER — Other Ambulatory Visit (HOSPITAL_COMMUNITY): Payer: Self-pay

## 2021-10-29 MED ORDER — VALSARTAN 160 MG PO TABS
ORAL_TABLET | ORAL | 1 refills | Status: DC
  Filled 2021-10-29: qty 30, 30d supply, fill #0

## 2021-11-01 DIAGNOSIS — R0789 Other chest pain: Secondary | ICD-10-CM | POA: Diagnosis not present

## 2021-11-01 DIAGNOSIS — I10 Essential (primary) hypertension: Secondary | ICD-10-CM | POA: Diagnosis not present

## 2021-11-05 NOTE — Progress Notes (Signed)
?[name] D.O. ?[facility] ?[place] ?Phone: [phone] ?Subjective:   ?I, [name], am serving as a Education administrator for Dr. Hulan Cook. ?This visit occurred during the SARS-CoV-2 public health emergency.  Safety protocols were in place, including screening questions prior to the visit, additional usage of staff PPE, and extensive cleaning of exam room while observing appropriate contact time as indicated for disinfecting solutions.  ? ?I'm seeing this patient by the request  of:  Mallory Cruel, MD ? ?CC: Neck pain, back pain, hip pain ? ?[id number]  ?[name] is a 56 y.o. female coming in with complaint of back and neck pain. OMT on 09/18/2021. Patient states same per usual. Thinks she needs injection in hip today. No new complaints.  Patient does state that she is having a mild reexacerbation of her right hip.  Has responded well to injections previously.  Starting to wake her up again at night.  Does have some traveling coming up. ? ?Medications patient has been prescribed: None ? ?Taking: ? ? ?  ? ? ? ? ?Reviewed prior external information including notes and imaging from previsou exam, outside providers and external EMR if available.  ? ?As well as notes that were available from care everywhere and other healthcare systems. ? ?Past medical history, social, surgical and family history all reviewed in electronic medical record.  No pertanent information unless stated regarding to the chief complaint.  ? ?Past Medical History:  ?Diagnosis Date  ? History of ectopic pregnancy   ? 04/ 2007  S/P LEFT SALPINGECTOMY  ? History of kidney stones   ? Hypothyroidism   ? Menorrhagia   ? Migraine   ?  ?No Known Allergies ? ? ?Review of Systems: ? No headache, visual changes, nausea, vomiting, diarrhea, constipation, dizziness, abdominal pain, skin rash, fevers, chills, night sweats, weight loss, swollen lymph nodes, body aches, joint swelling, chest pain, shortness of  breath, mood changes. POSITIVE muscle aches ? ?Objective  ?Blood pressure 128/86, pulse 85, height '5\' 7"'$  (1.702 m), weight 188 lb (85.3 kg), SpO2 95 %. ?  ?General: No apparent distress alert and oriented x3 mood and affect normal, dressed appropriately.  ?HEENT: Pupils equal, extraocular movements intact  ?Respiratory: Patient's speak in full sentences and does not appear short of breath  ?Cardiovascular: No lower extremity edema, non tender, no erythema  ?Right hip exam shows the patient is some tenderness to palpation over the greater trochanteric area.  Positive FABER test on the right side.  Negative straight leg test. ? ?After verbal consent patient was prepped with alcohol swab and with a 21-gauge 2 inch needle injected into the right greater trochanteric area.  A total of 2 cc of 0.5% Marcaine and 1 cc of Kenalog 40 mg per mill. ? ?Osteopathic findings ? ?C2 flexed rotated and side bent right ?C6 flexed rotated and side bent left ?T3 extended rotated and side bent right inhaled rib ?T9 extended rotated and side bent left ?L2 flexed rotated and side bent right ?Sacrum right on right ? ? ? ? ?  ?Assessment and Plan: ? ?Greater trochanteric bursitis of right hip ?Patient given injection and tolerated the procedure well, discussed icing regimen and home exercises.  Discussed which activities to do which ones to avoid, increase activity follow-up again in 6 to 8 weeks ? ?Cervicogenic headache ?Chronic, recently mild exacerbation.  We will continue to monitor.  To be potential etiologies of this is unlikely contributing.  Continue  to monitor.  No significant change in medications.  Follow-up again in 6 to 8 weeks.  ? ?Nonallopathic problems ? ?Decision today to treat with OMT was based on Physical Exam ? ?After verbal consent patient was treated with HVLA, ME, FPR techniques in cervical, rib, thoracic, lumbar, and sacral  areas ? ?Patient tolerated the procedure well with improvement in symptoms ? ?Patient given  exercises, stretches and lifestyle modifications ? ?See medications in patient instructions if given ? ?Patient will follow up in 4-8 weeks ? ?  ? ? ?The above documentation has been reviewed and is accurate and complete [name], DO ? ? ?  ? ? Note: This dictation was prepared with Dragon dictation along with smaller phrase technology. Any transcriptional errors that result from this process are unintentional.    ?  ?  ? ?

## 2021-11-06 ENCOUNTER — Other Ambulatory Visit (HOSPITAL_COMMUNITY): Payer: Self-pay

## 2021-11-06 ENCOUNTER — Ambulatory Visit: Payer: 59 | Admitting: Family Medicine

## 2021-11-06 VITALS — BP 128/86 | HR 85 | Ht 67.0 in | Wt 188.0 lb

## 2021-11-06 DIAGNOSIS — G4486 Cervicogenic headache: Secondary | ICD-10-CM

## 2021-11-06 DIAGNOSIS — M9908 Segmental and somatic dysfunction of rib cage: Secondary | ICD-10-CM | POA: Diagnosis not present

## 2021-11-06 DIAGNOSIS — R102 Pelvic and perineal pain: Secondary | ICD-10-CM | POA: Diagnosis not present

## 2021-11-06 DIAGNOSIS — M9902 Segmental and somatic dysfunction of thoracic region: Secondary | ICD-10-CM | POA: Diagnosis not present

## 2021-11-06 DIAGNOSIS — R350 Frequency of micturition: Secondary | ICD-10-CM | POA: Diagnosis not present

## 2021-11-06 DIAGNOSIS — M9901 Segmental and somatic dysfunction of cervical region: Secondary | ICD-10-CM | POA: Diagnosis not present

## 2021-11-06 DIAGNOSIS — M7061 Trochanteric bursitis, right hip: Secondary | ICD-10-CM | POA: Diagnosis not present

## 2021-11-06 DIAGNOSIS — M9903 Segmental and somatic dysfunction of lumbar region: Secondary | ICD-10-CM | POA: Diagnosis not present

## 2021-11-06 DIAGNOSIS — R351 Nocturia: Secondary | ICD-10-CM | POA: Diagnosis not present

## 2021-11-06 DIAGNOSIS — M9904 Segmental and somatic dysfunction of sacral region: Secondary | ICD-10-CM

## 2021-11-06 MED ORDER — ESTRADIOL 0.1 MG/GM VA CREA
TOPICAL_CREAM | VAGINAL | 1 refills | Status: DC
  Filled 2021-11-06: qty 42.5, 90d supply, fill #0

## 2021-11-06 NOTE — Assessment & Plan Note (Signed)
Chronic, recently mild exacerbation.  We will continue to monitor.  To be potential etiologies of this is unlikely contributing.  Continue to monitor.  No significant change in medications.  Follow-up again in 6 to 8 weeks. ?

## 2021-11-06 NOTE — Assessment & Plan Note (Signed)
Patient given injection and tolerated the procedure well, discussed icing regimen and home exercises.  Discussed which activities to do which ones to avoid, increase activity follow-up again in 6 to 8 weeks ?

## 2021-11-06 NOTE — Patient Instructions (Signed)
Good to see you! ?Injection in hip today ?Travel safe ?See you again in 8 weeks ? ?

## 2021-11-14 ENCOUNTER — Other Ambulatory Visit (HOSPITAL_COMMUNITY): Payer: Self-pay

## 2021-11-15 ENCOUNTER — Other Ambulatory Visit (HOSPITAL_COMMUNITY): Payer: Self-pay

## 2021-11-26 ENCOUNTER — Other Ambulatory Visit (HOSPITAL_COMMUNITY): Payer: Self-pay

## 2021-11-27 ENCOUNTER — Other Ambulatory Visit (HOSPITAL_COMMUNITY): Payer: Self-pay

## 2021-12-03 DIAGNOSIS — Z0100 Encounter for examination of eyes and vision without abnormal findings: Secondary | ICD-10-CM | POA: Diagnosis not present

## 2021-12-11 ENCOUNTER — Other Ambulatory Visit (HOSPITAL_COMMUNITY): Payer: Self-pay

## 2021-12-13 DIAGNOSIS — Z136 Encounter for screening for cardiovascular disorders: Secondary | ICD-10-CM | POA: Diagnosis not present

## 2021-12-13 DIAGNOSIS — Z8249 Family history of ischemic heart disease and other diseases of the circulatory system: Secondary | ICD-10-CM | POA: Diagnosis not present

## 2021-12-26 ENCOUNTER — Other Ambulatory Visit (HOSPITAL_COMMUNITY): Payer: Self-pay

## 2021-12-28 ENCOUNTER — Other Ambulatory Visit (HOSPITAL_COMMUNITY): Payer: Self-pay

## 2021-12-31 NOTE — Progress Notes (Unsigned)
  [name] [address] [place] Phone: [phone] Subjective:   [name], am serving as a scribe for Dr. Hulan Saas.  I'm seeing this patient by the request  of:  Lawerance Cruel, MD  CC: neck and back pain   [id number]  [name] is a 56 y.o. female coming in with complaint of back and neck pain. OMT on 11/06/2021. Patient states same per usual. Doing pretty good. No new complaints.  Medications patient has been prescribed: None  Taking:         Reviewed prior external information including notes and imaging from previsou exam, outside providers and external EMR if available.   As well as notes that were available from care everywhere and other healthcare systems.  Past medical history, social, surgical and family history all reviewed in electronic medical record.  No pertanent information unless stated regarding to the chief complaint.   Past Medical History:  Diagnosis Date   History of ectopic pregnancy    04/ 2007  S/P LEFT SALPINGECTOMY   History of kidney stones    Hypothyroidism    Menorrhagia    Migraine     No Known Allergies   Review of Systems:  No  visual changes, nausea, vomiting, diarrhea, constipation, dizziness, abdominal pain, skin rash, fevers, chills, night sweats, weight loss, swollen lymph nodes, body aches, joint swelling, chest pain, shortness of breath, mood changes. POSITIVE muscle aches, headache   Objective  Blood pressure 132/90, pulse 74, height '5\' 7"'$  (1.702 m), weight 186 lb (84.4 kg), SpO2 97 %.   General: No apparent distress alert and oriented x3 mood and affect normal, dressed appropriately.  HEENT: Pupils equal, extraocular movements intact  Respiratory: Patient's speak in full sentences and does not appear short of breath  Cardiovascular: No lower extremity edema, non tender, no erythema  Neck exam shows mild loss of lordosis increase tightness noted in the  neck noted.  Negative Spurling  Tightness noted in lumbar negative SLT   Osteopathic findings  C2 flexed rotated and side bent right C5 flexed rotated and side bent left T5 extended rotated and side bent right inhaled rib T7 extended rotated and side bent left L2 flexed rotated and side bent right Sacrum right on right     Assessment and Plan:  Cervicogenic headache Mild worsening and chronic exacerbation   Discussed if worsening then call us or seek medical attention  RTC in 6-8 weeks     Nonallopathic problems  Decision today to treat with OMT was based on Physical Exam  After verbal consent patient was treated with HVLA, ME, FPR techniques in cervical, rib, thoracic, lumbar, and sacral  areas  Patient tolerated the procedure well with improvement in symptoms  Patient given exercises, stretches and lifestyle modifications  See medications in patient instructions if given  Patient will follow up in 4-8 weeks      The above documentation has been reviewed and is accurate and complete [name], DO        Note: This dictation was prepared with Dragon dictation along with smaller phrase technology. Any transcriptional errors that result from this process are unintentional.

## 2022-01-01 ENCOUNTER — Ambulatory Visit: Payer: 59 | Admitting: Family Medicine

## 2022-01-01 VITALS — BP 132/90 | HR 74 | Ht 67.0 in | Wt 186.0 lb

## 2022-01-01 DIAGNOSIS — M9904 Segmental and somatic dysfunction of sacral region: Secondary | ICD-10-CM

## 2022-01-01 DIAGNOSIS — M9903 Segmental and somatic dysfunction of lumbar region: Secondary | ICD-10-CM

## 2022-01-01 DIAGNOSIS — M9902 Segmental and somatic dysfunction of thoracic region: Secondary | ICD-10-CM

## 2022-01-01 DIAGNOSIS — M9901 Segmental and somatic dysfunction of cervical region: Secondary | ICD-10-CM | POA: Diagnosis not present

## 2022-01-01 DIAGNOSIS — G4486 Cervicogenic headache: Secondary | ICD-10-CM | POA: Diagnosis not present

## 2022-01-01 DIAGNOSIS — M9908 Segmental and somatic dysfunction of rib cage: Secondary | ICD-10-CM

## 2022-01-01 NOTE — Patient Instructions (Signed)
Glad you're good in emergency situations Write [place] if you get another headache as bad as the one you described or seek medical attention See you again in 8 weeks

## 2022-01-01 NOTE — Assessment & Plan Note (Signed)
Mild worsening and chronic exacerbation   Discussed if worsening then call us or seek medical attention  RTC in 6-8 weeks

## 2022-01-15 ENCOUNTER — Other Ambulatory Visit: Payer: Self-pay | Admitting: Physician Assistant

## 2022-01-15 ENCOUNTER — Other Ambulatory Visit (HOSPITAL_COMMUNITY): Payer: Self-pay

## 2022-01-15 MED ORDER — LORAZEPAM 1 MG PO TABS
0.5000 mg | ORAL_TABLET | Freq: Three times a day (TID) | ORAL | 2 refills | Status: DC | PRN
Start: 2022-01-15 — End: 2022-04-15
  Filled 2022-01-15: qty 30, 10d supply, fill #0
  Filled 2022-02-12: qty 30, 10d supply, fill #1
  Filled 2022-03-14: qty 30, 10d supply, fill #2

## 2022-01-15 MED ORDER — SERTRALINE HCL 50 MG PO TABS
ORAL_TABLET | ORAL | 1 refills | Status: DC
  Filled 2022-01-15: qty 30, 30d supply, fill #0
  Filled 2022-02-12: qty 30, 30d supply, fill #1

## 2022-01-23 ENCOUNTER — Other Ambulatory Visit (HOSPITAL_COMMUNITY): Payer: Self-pay

## 2022-01-24 ENCOUNTER — Other Ambulatory Visit (HOSPITAL_COMMUNITY): Payer: Self-pay

## 2022-02-04 DIAGNOSIS — Z0000 Encounter for general adult medical examination without abnormal findings: Secondary | ICD-10-CM | POA: Diagnosis not present

## 2022-02-04 DIAGNOSIS — Z13220 Encounter for screening for lipoid disorders: Secondary | ICD-10-CM | POA: Diagnosis not present

## 2022-02-06 ENCOUNTER — Other Ambulatory Visit (HOSPITAL_COMMUNITY): Payer: Self-pay

## 2022-02-06 DIAGNOSIS — E039 Hypothyroidism, unspecified: Secondary | ICD-10-CM | POA: Diagnosis not present

## 2022-02-06 DIAGNOSIS — Z23 Encounter for immunization: Secondary | ICD-10-CM | POA: Diagnosis not present

## 2022-02-06 DIAGNOSIS — Z0000 Encounter for general adult medical examination without abnormal findings: Secondary | ICD-10-CM | POA: Diagnosis not present

## 2022-02-06 MED ORDER — LEVOTHYROXINE SODIUM 88 MCG PO TABS
ORAL_TABLET | ORAL | 4 refills | Status: DC
Start: 2022-02-06 — End: 2023-04-16
  Filled 2022-02-06 – 2022-03-24 (×2): qty 90, 90d supply, fill #0
  Filled 2022-06-04: qty 90, 90d supply, fill #1
  Filled 2022-09-13: qty 90, 90d supply, fill #2
  Filled 2022-12-16: qty 90, 90d supply, fill #3

## 2022-02-07 ENCOUNTER — Other Ambulatory Visit: Payer: Self-pay | Admitting: Family Medicine

## 2022-02-07 DIAGNOSIS — Z0000 Encounter for general adult medical examination without abnormal findings: Secondary | ICD-10-CM

## 2022-02-13 ENCOUNTER — Other Ambulatory Visit (HOSPITAL_COMMUNITY): Payer: Self-pay

## 2022-02-14 ENCOUNTER — Other Ambulatory Visit (HOSPITAL_COMMUNITY): Payer: Self-pay

## 2022-02-26 NOTE — Progress Notes (Signed)
[facility] [place] [address] [place] Phone: [phone] Subjective:   [name], am serving as a scribe for Dr. Hulan Saas.  I'm seeing this patient by the request  of:  Lawerance Cruel, MD  CC: Neck pain and cervicogenic headache follow-up  [id number]  [name] is a 56 y.o. female coming in with complaint of back and neck pain. OMT on 01/01/2022. Patient states same per usual. Everything is tight. No other complaints.  Still having some headaches from time to time.  Medications patient has been prescribed: None          Reviewed prior external information including notes and imaging from previsou exam, outside providers and external EMR if available.   As well as notes that were available from care everywhere and other healthcare systems.  Past medical history, social, surgical and family history all reviewed in electronic medical record.  No pertanent information unless stated regarding to the chief complaint.   Past Medical History:  Diagnosis Date   History of ectopic pregnancy    04/ 2007  S/P LEFT SALPINGECTOMY   History of kidney stones    Hypothyroidism    Menorrhagia    Migraine     No Known Allergies   Review of Systems:  No , visual changes, nausea, vomiting, diarrhea, constipation, dizziness, abdominal pain, skin rash, fevers, chills, night sweats, weight loss, swollen lymph nodes, body aches, joint swelling, chest pain, shortness of breath, mood changes. POSITIVE muscle aches, headache  Objective  Blood pressure 128/82, pulse 96, height '5\' 7"'$  (1.702 m), weight 192 lb (87.1 kg), SpO2 97 %.   General: No apparent distress alert and oriented x3 mood and affect normal, dressed appropriately.  HEENT: Pupils equal, extraocular movements intact  Respiratory: Patient's speak in full sentences and does not appear short of breath  Cardiovascular: No lower extremity edema, non tender, no erythema   Gait overall normal MSK:  Neck exam has significant tightness noted at the occipital region.  Patient has some limited right-sided rotation and left-sided sidebending.  Patient also has lacking the last 5 degrees of extension and flexion.  Osteopathic findings  C2 flexed rotated and side bent right C3 flexed rotated and side bent left C7 flexed rotated and side bent left T3 extended rotated and side bent right inhaled rib T7 extended rotated and side bent left L3 flexed rotated and side bent right Sacrum right on right    Assessment and Plan:  Cervicogenic headache More tightness noted at the base of the skull in the occipital region.  Discussed with patient about posture and ergonomics.  Patient does respond well though to osteopathic manipulation.  Because of the worsening symptoms given muscle relaxer which patient has responded to previously.  Patient will be doing some traveling in the near future and I do think that this will be helpful as well.  Follow-up again in 5 to 6 weeks    Nonallopathic problems  Decision today to treat with OMT was based on Physical Exam  After verbal consent patient was treated with HVLA, ME, FPR techniques in cervical, rib, thoracic, lumbar, and sacral  areas  Patient tolerated the procedure well with improvement in symptoms  Patient given exercises, stretches and lifestyle modifications  See medications in patient instructions if given  Patient will follow up in 6 weeks     The above documentation has been reviewed and is accurate and complete [name], DO  Note: This dictation was prepared with Dragon dictation along with smaller phrase technology. Any transcriptional errors that result from this process are unintentional.

## 2022-02-27 ENCOUNTER — Other Ambulatory Visit (HOSPITAL_COMMUNITY): Payer: Self-pay

## 2022-02-27 ENCOUNTER — Ambulatory Visit: Payer: 59 | Admitting: Family Medicine

## 2022-02-27 VITALS — BP 128/82 | HR 96 | Ht 67.0 in | Wt 192.0 lb

## 2022-02-27 DIAGNOSIS — M9901 Segmental and somatic dysfunction of cervical region: Secondary | ICD-10-CM | POA: Diagnosis not present

## 2022-02-27 DIAGNOSIS — M9902 Segmental and somatic dysfunction of thoracic region: Secondary | ICD-10-CM

## 2022-02-27 DIAGNOSIS — M9908 Segmental and somatic dysfunction of rib cage: Secondary | ICD-10-CM | POA: Diagnosis not present

## 2022-02-27 DIAGNOSIS — M9903 Segmental and somatic dysfunction of lumbar region: Secondary | ICD-10-CM | POA: Diagnosis not present

## 2022-02-27 DIAGNOSIS — M9904 Segmental and somatic dysfunction of sacral region: Secondary | ICD-10-CM

## 2022-02-27 DIAGNOSIS — G4486 Cervicogenic headache: Secondary | ICD-10-CM | POA: Diagnosis not present

## 2022-02-27 MED ORDER — TIZANIDINE HCL 2 MG PO TABS
2.0000 mg | ORAL_TABLET | Freq: Every day | ORAL | 0 refills | Status: DC
  Filled 2022-02-27: qty 30, 30d supply, fill #0

## 2022-02-27 NOTE — Patient Instructions (Signed)
Zanaflex 2 mg at night for next 3 nights See me in 5-8 weeks

## 2022-02-27 NOTE — Assessment & Plan Note (Signed)
More tightness noted at the base of the skull in the occipital region.  Discussed with patient about posture and ergonomics.  Patient does respond well though to osteopathic manipulation.  Because of the worsening symptoms given muscle relaxer which patient has responded to previously.  Patient will be doing some traveling in the near future and I do think that this will be helpful as well.  Follow-up again in 5 to 6 weeks

## 2022-03-14 ENCOUNTER — Other Ambulatory Visit (HOSPITAL_COMMUNITY): Payer: Self-pay

## 2022-03-19 ENCOUNTER — Other Ambulatory Visit: Payer: Self-pay | Admitting: Physician Assistant

## 2022-03-19 ENCOUNTER — Other Ambulatory Visit (HOSPITAL_COMMUNITY): Payer: Self-pay

## 2022-03-19 MED ORDER — SERTRALINE HCL 50 MG PO TABS
50.0000 mg | ORAL_TABLET | Freq: Every day | ORAL | 1 refills | Status: DC
  Filled 2022-03-19: qty 90, 90d supply, fill #0

## 2022-03-20 ENCOUNTER — Other Ambulatory Visit (HOSPITAL_COMMUNITY): Payer: Self-pay

## 2022-03-24 ENCOUNTER — Other Ambulatory Visit (HOSPITAL_COMMUNITY): Payer: Self-pay

## 2022-03-25 ENCOUNTER — Other Ambulatory Visit (HOSPITAL_COMMUNITY): Payer: Self-pay

## 2022-04-04 NOTE — Progress Notes (Unsigned)
  [name] [address] [place] Phone: [phone] Subjective:   [name], am serving as a scribe for Dr. Hulan Saas.  I'm seeing this patient by the request  of:  Lawerance Cruel, MD  CC: neck pain follo wup   [id number]  [name] is a 56 y.o. female coming in with complaint of back and neck pain. OMT on 02/27/2022. Patient states doing well. Same old story. No new issues.  Medications patient has been prescribed: Zanaflex  Taking:         Reviewed prior external information including notes and imaging from previsou exam, outside providers and external EMR if available.   As well as notes that were available from care everywhere and other healthcare systems.  Past medical history, social, surgical and family history all reviewed in electronic medical record.  No pertanent information unless stated regarding to the chief complaint.   Past Medical History:  Diagnosis Date   History of ectopic pregnancy    04/ 2007  S/P LEFT SALPINGECTOMY   History of kidney stones    Hypothyroidism    Menorrhagia    Migraine     No Known Allergies   Review of Systems:  No  visual changes, nausea, vomiting, diarrhea, constipation, dizziness, abdominal pain, skin rash, fevers, chills, night sweats, weight loss, swollen lymph nodes, body aches, joint swelling, chest pain, shortness of breath, mood changes. POSITIVE muscle aches, headache  Objective  Blood pressure (!) 130/90, pulse 81, height '5\' 7"'$  (1.702 m), weight 192 lb (87.1 kg), SpO2 92 %.   General: No apparent distress alert and oriented x3 mood and affect normal, dressed appropriately.  HEENT: Pupils equal, extraocular movements intact  Respiratory: Patient's speak in full sentences and does not appear short of breath  Cardiovascular: No lower extremity edema, non tender, no erythema  Gait MSK:  Back low back does have low dose.    Osteopathic  findings  C2 flexed rotated and side bent right C3 flexed rotated and side bent left C5 flexed rotated and side bent right T5 extended rotated and side bent right inhaled rib L3 flexed rotated and side bent right Sacrum right on right       Assessment and Plan:  Cervicogenic headache Patient overall seems to be doing relatively well.  Discussed icing regimen and home exercises again.  No significant changes in medication but has not Zanaflex for any breakthrough.  Follow-up again in 6 to 8 weeks.    Nonallopathic problems  Decision today to treat with OMT was based on Physical Exam  After verbal consent patient was treated with HVLA, ME, FPR techniques in cervical, rib, thoracic, lumbar, and sacral  areas  Patient tolerated the procedure well with improvement in symptoms  Patient given exercises, stretches and lifestyle modifications  See medications in patient instructions if given  Patient will follow up in 4-8 weeks     The above documentation has been reviewed and is accurate and complete [name], DO         Note: This dictation was prepared with Dragon dictation along with smaller phrase technology. Any transcriptional errors that result from this process are unintentional.

## 2022-04-07 ENCOUNTER — Ambulatory Visit: Payer: 59 | Admitting: Family Medicine

## 2022-04-07 VITALS — BP 130/90 | HR 81 | Ht 67.0 in | Wt 192.0 lb

## 2022-04-07 DIAGNOSIS — M9908 Segmental and somatic dysfunction of rib cage: Secondary | ICD-10-CM | POA: Diagnosis not present

## 2022-04-07 DIAGNOSIS — M9904 Segmental and somatic dysfunction of sacral region: Secondary | ICD-10-CM | POA: Diagnosis not present

## 2022-04-07 DIAGNOSIS — M9903 Segmental and somatic dysfunction of lumbar region: Secondary | ICD-10-CM | POA: Diagnosis not present

## 2022-04-07 DIAGNOSIS — G4486 Cervicogenic headache: Secondary | ICD-10-CM

## 2022-04-07 DIAGNOSIS — M9901 Segmental and somatic dysfunction of cervical region: Secondary | ICD-10-CM | POA: Diagnosis not present

## 2022-04-07 DIAGNOSIS — M9902 Segmental and somatic dysfunction of thoracic region: Secondary | ICD-10-CM | POA: Diagnosis not present

## 2022-04-07 NOTE — Assessment & Plan Note (Signed)
Patient overall seems to be doing relatively well.  Discussed icing regimen and home exercises again.  No significant changes in medication but has not Zanaflex for any breakthrough.  Follow-up again in 6 to 8 weeks.

## 2022-04-07 NOTE — Patient Instructions (Signed)
Good to see you! Glad you survived camp pony Keep doing exercises

## 2022-04-10 ENCOUNTER — Other Ambulatory Visit (HOSPITAL_COMMUNITY): Payer: Self-pay

## 2022-04-10 ENCOUNTER — Telehealth (HOSPITAL_COMMUNITY): Payer: Self-pay | Admitting: Pharmacy Technician

## 2022-04-10 NOTE — Telephone Encounter (Signed)
Patient Advocate Encounter   Received notification that prior authorization for Aimovig '140MG'$ /ML auto-injectors is required.   PA submitted on 04/10/2022 Key [id number] Status is pending       Lyndel Safe, [place] Patient Advocate Specialist [place] Patient Advocate Team Direct Number: [phone]  Fax: [phone]

## 2022-04-11 ENCOUNTER — Ambulatory Visit
Admission: RE | Admit: 2022-04-11 | Discharge: 2022-04-11 | Disposition: A | Discharge status: Home / Self Care | Payer: No Typology Code available for payment source | Source: Ambulatory Visit | Attending: Family Medicine | Admitting: Family Medicine

## 2022-04-11 ENCOUNTER — Other Ambulatory Visit (HOSPITAL_COMMUNITY): Payer: Self-pay

## 2022-04-11 DIAGNOSIS — Z0000 Encounter for general adult medical examination without abnormal findings: Secondary | ICD-10-CM

## 2022-04-15 ENCOUNTER — Other Ambulatory Visit (HOSPITAL_COMMUNITY): Payer: Self-pay

## 2022-04-15 ENCOUNTER — Other Ambulatory Visit: Payer: Self-pay | Admitting: Physician Assistant

## 2022-04-15 MED ORDER — LORAZEPAM 1 MG PO TABS
0.5000 mg | ORAL_TABLET | Freq: Three times a day (TID) | ORAL | 2 refills | Status: DC | PRN
  Filled 2022-04-15: qty 30, 10d supply, fill #0
  Filled 2022-05-03: qty 30, 10d supply, fill #1
  Filled 2022-05-19: qty 30, 10d supply, fill #2

## 2022-04-15 NOTE — Telephone Encounter (Signed)
Patient Advocate Encounter  Prior Authorization for Aimovig '140MG'$ /ML auto-injectors has been approved.    PA# [id number] Effective dates: 04/15/2022 through 04/15/2023     Lyndel Safe, [place] Patient Advocate Specialist [place] Patient Advocate Team Direct Number: [phone]  Fax: [phone]

## 2022-05-03 ENCOUNTER — Other Ambulatory Visit (HOSPITAL_COMMUNITY): Payer: Self-pay

## 2022-05-12 ENCOUNTER — Other Ambulatory Visit (HOSPITAL_COMMUNITY): Payer: Self-pay

## 2022-05-20 ENCOUNTER — Other Ambulatory Visit (HOSPITAL_COMMUNITY): Payer: Self-pay

## 2022-05-27 ENCOUNTER — Ambulatory Visit: Payer: 59 | Admitting: Family Medicine

## 2022-05-27 DIAGNOSIS — M810 Age-related osteoporosis without current pathological fracture: Secondary | ICD-10-CM | POA: Diagnosis not present

## 2022-05-27 DIAGNOSIS — Z01419 Encounter for gynecological examination (general) (routine) without abnormal findings: Secondary | ICD-10-CM | POA: Diagnosis not present

## 2022-05-27 DIAGNOSIS — Z1231 Encounter for screening mammogram for malignant neoplasm of breast: Secondary | ICD-10-CM | POA: Diagnosis not present

## 2022-05-27 DIAGNOSIS — Z13820 Encounter for screening for osteoporosis: Secondary | ICD-10-CM | POA: Diagnosis not present

## 2022-05-27 DIAGNOSIS — Z6831 Body mass index (BMI) 31.0-31.9, adult: Secondary | ICD-10-CM | POA: Diagnosis not present

## 2022-05-27 NOTE — Progress Notes (Unsigned)
[name] [name] Phone: [phone] Subjective:   [name], am serving as a scribe for Dr. Hulan Saas.  I'm seeing this patient by the request  of:  Lawerance Cruel, MD  CC: Headache and neck pain follow-up  [id number]  [name] is a 56 y.o. female coming in with complaint of back and neck pain. OMT on 04/07/2022. Patient states that she wakes up with pain in back of her head that feels like it is going to become a migraine. Pain subsides when she gets up and walks her dogs. Pain occurring in neck as well. Notes tightness in shoulders during the day but pain subsides in the neck. Using COOP pillow.   Medications patient has been prescribed:   Taking:         Reviewed prior external information including notes and imaging from previsou exam, outside providers and external EMR if available.   As well as notes that were available from care everywhere and other healthcare systems.  Past medical history, social, surgical and family history all reviewed in electronic medical record.  [name] pertanent information unless stated regarding to the chief complaint.   Past Medical History:  Diagnosis Date   History of ectopic pregnancy    04/ 2007  S/P LEFT SALPINGECTOMY   History of kidney stones    Hypothyroidism    Menorrhagia    Migraine     [name] Known Allergies   Review of Systems:  [name] , visual changes, nausea, vomiting, diarrhea, constipation, dizziness, abdominal pain, skin rash, fevers, chills, night sweats, weight loss, swollen lymph nodes, body aches, joint swelling, chest pain, shortness of breath, mood changes. POSITIVE muscle aches, headache  Objective  Blood pressure 132/86, pulse 87, height '5\' 7"'$  (1.702 m), weight 193 lb (87.5 kg), SpO2 95 %.   General: [name] apparent distress alert and oriented x3 mood and affect normal, dressed appropriately.  HEENT: Pupils equal, extraocular movements  intact  Respiratory: Patient's speak in full sentences and does not appear short of breath  Cardiovascular: [name] lower extremity edema, non tender, [name] erythema  Patient does have some loss of lordosis of the neck.  Increasing discomfort and pain noted over the trapezius on the left side.  Osteopathic findings  C6 flexed rotated and side bent left T3 extended rotated and side bent left inhaled rib T9 extended rotated and side bent left inhaled rib L2 flexed rotated and side bent right Sacrum right on right       Assessment and Plan:  Cervicogenic headache Chronic problem with continuing worsening symptoms from time to time.  This time a year can be difficult.  We discussed the Zanaflex 2 mg at bedtime.  Continue it.  Patient has ibuprofen 200 mg as needed for breakthrough pain.  Patient did respond well to osteopathic manipulation to help with this pain as well today.  Discussed posture and ergonomics.  Follow-up again in 6 to 8 weeks    Nonallopathic problems  Decision today to treat with OMT was based on Physical Exam  After verbal consent patient was treated with HVLA, ME, FPR techniques in cervical, rib, thoracic, lumbar, and sacral  areas  Patient tolerated the procedure well with improvement in symptoms  Patient given exercises, stretches and lifestyle modifications  See medications in patient instructions if given  Patient will follow up in 4-8 weeks    The above documentation has been reviewed and is accurate and complete  [name], DO          Note: This dictation was prepared with Dragon dictation along with smaller phrase technology. Any transcriptional errors that result from this process are unintentional.

## 2022-05-29 ENCOUNTER — Encounter: Payer: Self-pay | Admitting: Family Medicine

## 2022-05-29 ENCOUNTER — Ambulatory Visit: Payer: 59 | Admitting: Family Medicine

## 2022-05-29 ENCOUNTER — Telehealth: Payer: Self-pay | Admitting: Pharmacy Technician

## 2022-05-29 ENCOUNTER — Other Ambulatory Visit: Payer: Self-pay

## 2022-05-29 VITALS — BP 132/86 | HR 87 | Ht 67.0 in | Wt 193.0 lb

## 2022-05-29 DIAGNOSIS — M9904 Segmental and somatic dysfunction of sacral region: Secondary | ICD-10-CM | POA: Diagnosis not present

## 2022-05-29 DIAGNOSIS — M9903 Segmental and somatic dysfunction of lumbar region: Secondary | ICD-10-CM | POA: Diagnosis not present

## 2022-05-29 DIAGNOSIS — M810 Age-related osteoporosis without current pathological fracture: Secondary | ICD-10-CM | POA: Insufficient documentation

## 2022-05-29 DIAGNOSIS — M9901 Segmental and somatic dysfunction of cervical region: Secondary | ICD-10-CM | POA: Diagnosis not present

## 2022-05-29 DIAGNOSIS — M9908 Segmental and somatic dysfunction of rib cage: Secondary | ICD-10-CM | POA: Diagnosis not present

## 2022-05-29 DIAGNOSIS — M9902 Segmental and somatic dysfunction of thoracic region: Secondary | ICD-10-CM | POA: Diagnosis not present

## 2022-05-29 DIAGNOSIS — G4486 Cervicogenic headache: Secondary | ICD-10-CM

## 2022-05-29 NOTE — Telephone Encounter (Signed)
Auth Submission: NO AUTH NEEDED Payer: [employer] Medication & CPT/J Code(s) submitted: Reclast (Zolendronic acid) [id number] Route of submission (phone, fax, portal):  Phone # Fax # Auth type: Buy/Bill Units/visits requested: X1 Reference number:  Approval from: 05/29/22 to 08/10/22

## 2022-05-29 NOTE — Assessment & Plan Note (Signed)
Chronic problem with continuing worsening symptoms from time to time.  This time a year can be difficult.  We discussed the Zanaflex 2 mg at bedtime.  Continue it.  Patient has ibuprofen 200 mg as needed for breakthrough pain.  Patient did respond well to osteopathic manipulation to help with this pain as well today.  Discussed posture and ergonomics.  Follow-up again in 6 to 8 weeks

## 2022-05-29 NOTE — Patient Instructions (Signed)
Good to see you Watch position with driving or work to see if this is contributing to thigh pain See me in 8 weeks

## 2022-06-02 DIAGNOSIS — L578 Other skin changes due to chronic exposure to nonionizing radiation: Secondary | ICD-10-CM | POA: Diagnosis not present

## 2022-06-02 DIAGNOSIS — L852 Keratosis punctata (palmaris et plantaris): Secondary | ICD-10-CM | POA: Diagnosis not present

## 2022-06-02 DIAGNOSIS — L814 Other melanin hyperpigmentation: Secondary | ICD-10-CM | POA: Diagnosis not present

## 2022-06-02 DIAGNOSIS — L304 Erythema intertrigo: Secondary | ICD-10-CM | POA: Diagnosis not present

## 2022-06-02 DIAGNOSIS — D229 Melanocytic nevi, unspecified: Secondary | ICD-10-CM | POA: Diagnosis not present

## 2022-06-02 DIAGNOSIS — L739 Follicular disorder, unspecified: Secondary | ICD-10-CM | POA: Diagnosis not present

## 2022-06-02 DIAGNOSIS — L821 Other seborrheic keratosis: Secondary | ICD-10-CM | POA: Diagnosis not present

## 2022-06-03 ENCOUNTER — Other Ambulatory Visit (HOSPITAL_COMMUNITY): Payer: Self-pay

## 2022-06-03 ENCOUNTER — Other Ambulatory Visit: Payer: Self-pay | Admitting: Physician Assistant

## 2022-06-03 MED ORDER — LORAZEPAM 1 MG PO TABS
0.5000 mg | ORAL_TABLET | Freq: Three times a day (TID) | ORAL | 2 refills | Status: DC | PRN
  Filled 2022-06-03: qty 60, 20d supply, fill #0
  Filled 2022-06-25: qty 60, 20d supply, fill #1
  Filled 2022-07-21: qty 60, 20d supply, fill #2

## 2022-06-03 MED ORDER — SERTRALINE HCL 100 MG PO TABS
100.0000 mg | ORAL_TABLET | Freq: Every day | ORAL | 0 refills | Status: DC
  Filled 2022-06-03: qty 90, 90d supply, fill #0

## 2022-06-04 ENCOUNTER — Other Ambulatory Visit (HOSPITAL_COMMUNITY): Payer: Self-pay

## 2022-06-05 ENCOUNTER — Other Ambulatory Visit (HOSPITAL_COMMUNITY): Payer: Self-pay

## 2022-06-06 ENCOUNTER — Other Ambulatory Visit (HOSPITAL_COMMUNITY): Payer: Self-pay

## 2022-06-06 ENCOUNTER — Telehealth: Payer: 59 | Admitting: Physician Assistant

## 2022-06-06 DIAGNOSIS — J069 Acute upper respiratory infection, unspecified: Secondary | ICD-10-CM

## 2022-06-06 MED ORDER — FLUTICASONE PROPIONATE 50 MCG/ACT NA SUSP
2.0000 | Freq: Every day | NASAL | 0 refills | Status: DC
  Filled 2022-06-06: qty 16, 30d supply, fill #0

## 2022-06-06 MED ORDER — BENZONATATE 100 MG PO CAPS
100.0000 mg | ORAL_CAPSULE | Freq: Three times a day (TID) | ORAL | 0 refills | Status: DC | PRN
  Filled 2022-06-06: qty 30, 10d supply, fill #0

## 2022-06-06 NOTE — Progress Notes (Signed)
E-Visit for Upper Respiratory Infection   We are sorry you are not feeling well.  Here is how we plan to help!  Based on what you have shared with me, it looks like you may have a viral upper respiratory infection.  Upper respiratory infections are caused by a large number of viruses; however, rhinovirus is the most common cause.   Symptoms vary from person to person, with common symptoms including sore throat, cough, fatigue or lack of energy and feeling of general discomfort.  A low-grade fever of up to 100.4 may present, but is often uncommon.  Symptoms vary however, and are closely related to a person's age or underlying illnesses.  The most common symptoms associated with an upper respiratory infection are nasal discharge or congestion, cough, sneezing, headache and pressure in the ears and face.  These symptoms usually persist for about 3 to 10 days, but can last up to 2 weeks.  It is important to know that upper respiratory infections do not cause serious illness or complications in most cases.    Upper respiratory infections can be transmitted from person to person, with the most common method of transmission being a person's hands.  The virus is able to live on the skin and can infect other persons for up to 2 hours after direct contact.  Also, these can be transmitted when someone coughs or sneezes; thus, it is important to cover the mouth to reduce this risk.  To keep the spread of the illness at bay, good hand hygiene is very important.  This is an infection that is most likely caused by a virus. There are no specific treatments other than to help you with the symptoms until the infection runs its course.  We are sorry you are not feeling well.  Here is how we plan to help!   For nasal congestion, you may use an oral decongestants such as Mucinex D or if you have glaucoma or high blood pressure use plain Mucinex.  Saline nasal spray or nasal drops can help and can safely be used as often as  needed for congestion.  For your congestion, I have prescribed Fluticasone nasal spray one spray in each nostril twice a day  If you do not have a history of heart disease, hypertension, diabetes or thyroid disease, prostate/bladder issues or glaucoma, you may also use Sudafed to treat nasal congestion.  It is highly recommended that you consult with a pharmacist or your primary care physician to ensure this medication is safe for you to take.     If you have a cough, you may use cough suppressants such as Delsym and Robitussin.  If you have glaucoma or high blood pressure, you can also use Coricidin HBP.   For cough I have prescribed for you A prescription cough medication called Tessalon Perles 100 mg. You may take 1-2 capsules every 8 hours as needed for cough  If you have a sore or scratchy throat, use a saltwater gargle-  to  teaspoon of salt dissolved in a 4-ounce to 8-ounce glass of warm water.  Gargle the solution for approximately 15-30 seconds and then spit.  It is important not to swallow the solution.  You can also use throat lozenges/cough drops and Chloraseptic spray to help with throat pain or discomfort.  Warm or cold liquids can also be helpful in relieving throat pain.  For headache, pain or general discomfort, you can use Ibuprofen or Tylenol as directed.   Some authorities believe   that zinc sprays or the use of Echinacea may shorten the course of your symptoms.   HOME CARE Only take medications as instructed by your medical team. Be sure to drink plenty of fluids. Water is fine as well as fruit juices, sodas and electrolyte beverages. You may want to stay away from caffeine or alcohol. If you are nauseated, try taking small sips of liquids. How do you know if you are getting enough fluid? Your urine should be a pale yellow or almost colorless. Get rest. Taking a steamy shower or using a humidifier may help nasal congestion and ease sore throat pain. You can place a towel over  your head and breathe in the steam from hot water coming from a faucet. Using a saline nasal spray works much the same way. Cough drops, hard candies and sore throat lozenges may ease your cough. Avoid close contacts especially the very young and the elderly Cover your mouth if you cough or sneeze Always remember to wash your hands.   GET HELP RIGHT AWAY IF: You develop worsening fever. If your symptoms do not improve within 10 days You develop yellow or green discharge from your nose over 3 days. You have coughing fits You develop a severe head ache or visual changes. You develop shortness of breath, difficulty breathing or start having chest pain Your symptoms persist after you have completed your treatment plan  MAKE SURE YOU  Understand these instructions. Will watch your condition. Will get help right away if you are not doing well or get worse.  Thank you for choosing an e-visit.  Your e-visit answers were reviewed by a board certified advanced clinical practitioner to complete your personal care plan. Depending upon the condition, your plan could have included both over the counter or prescription medications.  Please review your pharmacy choice. Make sure the pharmacy is open so you can pick up prescription now. If there is a problem, you may contact your provider through MyChart messaging and have the prescription routed to another pharmacy.  Your safety is important to us. If you have drug allergies check your prescription carefully.   For the next 24 hours you can use MyChart to ask questions about today's visit, request a non-urgent call back, or ask for a work or school excuse. You will get an email in the next two days asking about your experience. I hope that your e-visit has been valuable and will speed your recovery.  I have spent 5 minutes in review of e-visit questionnaire, review and updating patient chart, medical decision making and response to patient.    Zariah Jost M Chandy Tarman, PA-C  

## 2022-06-12 ENCOUNTER — Ambulatory Visit: Payer: 59

## 2022-06-13 ENCOUNTER — Ambulatory Visit (INDEPENDENT_AMBULATORY_CARE_PROVIDER_SITE_OTHER): Payer: 59

## 2022-06-13 VITALS — BP 136/86 | HR 52 | Temp 98.0°F | Resp 18 | Ht 67.0 in | Wt 193.2 lb

## 2022-06-13 DIAGNOSIS — M810 Age-related osteoporosis without current pathological fracture: Secondary | ICD-10-CM

## 2022-06-13 MED ORDER — ACETAMINOPHEN 325 MG PO TABS
650.0000 mg | ORAL_TABLET | Freq: Once | ORAL | Status: AC
  Administered 2022-06-13: 650 mg via ORAL
  Filled 2022-06-13: qty 2

## 2022-06-13 MED ORDER — SODIUM CHLORIDE 0.9 % IV SOLN: INTRAVENOUS | Status: DC

## 2022-06-13 MED ORDER — DIPHENHYDRAMINE HCL 25 MG PO CAPS
25.0000 mg | ORAL_CAPSULE | Freq: Once | ORAL | Status: AC
  Administered 2022-06-13: 25 mg via ORAL
  Filled 2022-06-13: qty 1

## 2022-06-13 MED ORDER — ZOLEDRONIC ACID 5 MG/100ML IV SOLN
5.0000 mg | Freq: Once | INTRAVENOUS | Status: AC
  Administered 2022-06-13: 5 mg via INTRAVENOUS
  Filled 2022-06-13: qty 100

## 2022-06-13 NOTE — Patient Instructions (Signed)

## 2022-06-13 NOTE — Progress Notes (Signed)
Diagnosis: Osteoporosis  Provider:  Marshell Garfinkel MD  Procedure: Infusion  IV Type: Peripheral, IV Location: L Antecubital  Reclast (Zolendronic Acid), Dose: 5 mg  Infusion Start Time: 3267  Infusion Stop Time: 1630  Post Infusion IV Care: Peripheral IV Discontinued  Pt stayed  10 min observation.  Discharge: Condition: Good, Destination: Home . AVS provided to patient.   Performed by:  Cleophus Molt, RN

## 2022-06-26 ENCOUNTER — Other Ambulatory Visit (HOSPITAL_COMMUNITY): Payer: Self-pay

## 2022-06-27 ENCOUNTER — Other Ambulatory Visit (HOSPITAL_COMMUNITY): Payer: Self-pay

## 2022-07-02 ENCOUNTER — Other Ambulatory Visit (HOSPITAL_COMMUNITY): Payer: Self-pay

## 2022-07-05 ENCOUNTER — Other Ambulatory Visit (HOSPITAL_COMMUNITY): Payer: Self-pay

## 2022-07-09 ENCOUNTER — Ambulatory Visit: Payer: 59 | Admitting: Physician Assistant

## 2022-07-17 NOTE — Progress Notes (Signed)
[place] Phone: [phone] Subjective:   [name], am serving as a scribe for Dr. Hulan Saas.  I'm seeing this patient by the request  of:  Mallory Cruel, MD  CC: Neck and back pain follow-up  [id number]  [name] is a 56 y.o. female coming in with complaint of back and neck pain. OMT 05/29/2022. Patient states that she has been having pain in B hip and SI joints. Painful to be seated. No injury since last visit. Has had some headaches but they come and go. Does have some neck tightness that correlates with the headaches.   Medications patient has been prescribed: Zanaflex  Taking:         Reviewed prior external information including notes and imaging from previsou exam, outside providers and external EMR if available.   As well as notes that were available from care everywhere and other healthcare systems.  Past medical history, social, surgical and family history all reviewed in electronic medical record.  No pertanent information unless stated regarding to the chief complaint.   Past Medical History:  Diagnosis Date   History of ectopic pregnancy    04/ 2007  S/P LEFT SALPINGECTOMY   History of kidney stones    Hypothyroidism    Menorrhagia    Migraine     No Known Allergies   Review of Systems:  No headache, visual changes, nausea, vomiting, diarrhea, constipation, dizziness, abdominal pain, skin rash, fevers, chills, night sweats, weight loss, swollen lymph nodes, body aches, joint swelling, chest pain, shortness of breath, mood changes. POSITIVE muscle aches  Objective  Blood pressure 122/84, pulse 82, height '5\' 7"'$  (1.702 m), weight 193 lb (87.5 kg), SpO2 97 %.   General: No apparent distress alert and oriented x3 mood and affect normal, dressed appropriately.  HEENT: Pupils equal, extraocular movements intact  Respiratory: Patient's speak in full sentences and  does not appear short of breath  Cardiovascular: No lower extremity edema, non tender, no erythema  Low back with some loss of lordosis.  Some tenderness to palpation of the paraspinal musculature.  Tightness noted with Corky Sox right greater than left.  Osteopathic findings  C7 flexed rotated and side bent left T3 extended rotated and side bent right inhaled rib T9 extended rotated and side bent left L2 flexed rotated and side bent right Sacrum right on right       Assessment and Plan:  Low back pain More tightness around the sacroiliac joints noted as well.  Discussed icing regimen and home exercises.  Follow-up again in 6 to 8 weeks discussed with patient continue to be active otherwise.  Patient did have some tightness noted in the paraspinal musculature as well.  Discussed icing regimen and home exercises.  Follow-up again in 6 to 8 weeks  Cervicogenic headache Chronic problem as well.  Discussed the medications including Zanaflex at the 2 mg at night.  Can increase if necessary.  Follow-up in 6 to 8 weeks    Nonallopathic problems  Decision today to treat with OMT was based on Physical Exam  After verbal consent patient was treated with HVLA, ME, FPR techniques in cervical, rib, thoracic, lumbar, and sacral  areas  Patient tolerated the procedure well with improvement in symptoms  Patient given exercises, stretches and lifestyle modifications  See medications in patient instructions if given  Patient will follow up in 4-8 weeks    The above documentation has  been reviewed and is accurate and complete [name], DO          Note: This dictation was prepared with Dragon dictation along with smaller phrase technology. Any transcriptional errors that result from this process are unintentional.

## 2022-07-18 ENCOUNTER — Other Ambulatory Visit (HOSPITAL_COMMUNITY): Payer: Self-pay

## 2022-07-22 ENCOUNTER — Other Ambulatory Visit (HOSPITAL_COMMUNITY): Payer: Self-pay

## 2022-07-23 ENCOUNTER — Encounter: Payer: Self-pay | Admitting: Family Medicine

## 2022-07-23 ENCOUNTER — Ambulatory Visit: Payer: 59 | Admitting: Family Medicine

## 2022-07-23 VITALS — BP 122/84 | HR 82 | Ht 67.0 in | Wt 193.0 lb

## 2022-07-23 DIAGNOSIS — M9901 Segmental and somatic dysfunction of cervical region: Secondary | ICD-10-CM

## 2022-07-23 DIAGNOSIS — G4486 Cervicogenic headache: Secondary | ICD-10-CM | POA: Diagnosis not present

## 2022-07-23 DIAGNOSIS — M9902 Segmental and somatic dysfunction of thoracic region: Secondary | ICD-10-CM | POA: Diagnosis not present

## 2022-07-23 DIAGNOSIS — M9903 Segmental and somatic dysfunction of lumbar region: Secondary | ICD-10-CM

## 2022-07-23 DIAGNOSIS — M5441 Lumbago with sciatica, right side: Secondary | ICD-10-CM | POA: Diagnosis not present

## 2022-07-23 DIAGNOSIS — M9908 Segmental and somatic dysfunction of rib cage: Secondary | ICD-10-CM | POA: Diagnosis not present

## 2022-07-23 DIAGNOSIS — M5442 Lumbago with sciatica, left side: Secondary | ICD-10-CM

## 2022-07-23 DIAGNOSIS — M9904 Segmental and somatic dysfunction of sacral region: Secondary | ICD-10-CM | POA: Diagnosis not present

## 2022-07-23 NOTE — Assessment & Plan Note (Signed)
Chronic problem as well.  Discussed the medications including Zanaflex at the 2 mg at night.  Can increase if necessary.  Follow-up in 6 to 8 weeks

## 2022-07-23 NOTE — Assessment & Plan Note (Addendum)
More tightness around the sacroiliac joints noted as well.  Discussed icing regimen and home exercises.  Follow-up again in 6 to 8 weeks discussed with patient continue to be active otherwise.  Patient did have some tightness noted in the paraspinal musculature as well.  Discussed icing regimen and home exercises.  Follow-up again in 6 to 8 weeks

## 2022-07-23 NOTE — Patient Instructions (Addendum)
IBU 3x a day for 3 days Take mm relaxer at night for 3 days Happy Holidays See me in 5-6 weeks

## 2022-07-24 ENCOUNTER — Encounter: Payer: Self-pay | Admitting: Obstetrics and Gynecology

## 2022-07-26 ENCOUNTER — Encounter: Payer: Self-pay | Admitting: Obstetrics and Gynecology

## 2022-07-30 ENCOUNTER — Other Ambulatory Visit (HOSPITAL_COMMUNITY): Payer: Self-pay

## 2022-08-19 ENCOUNTER — Other Ambulatory Visit: Payer: Self-pay | Admitting: Physician Assistant

## 2022-08-19 ENCOUNTER — Other Ambulatory Visit (HOSPITAL_COMMUNITY): Payer: Self-pay

## 2022-08-19 MED ORDER — SERTRALINE HCL 100 MG PO TABS
100.0000 mg | ORAL_TABLET | Freq: Every day | ORAL | 1 refills | Status: DC
  Filled 2022-08-19: qty 90, 90d supply, fill #0
  Filled 2022-11-25: qty 90, 90d supply, fill #1

## 2022-08-19 MED ORDER — LORAZEPAM 1 MG PO TABS
0.5000 mg | ORAL_TABLET | Freq: Three times a day (TID) | ORAL | 5 refills | Status: DC | PRN
  Filled 2022-08-19: qty 60, 20d supply, fill #0
  Filled 2022-09-16: qty 60, 20d supply, fill #1
  Filled 2022-10-17: qty 60, 20d supply, fill #2
  Filled 2022-11-14 – 2022-11-15 (×2): qty 60, 20d supply, fill #3
  Filled 2022-12-16: qty 60, 20d supply, fill #4
  Filled 2023-01-15: qty 60, 20d supply, fill #5

## 2022-08-20 ENCOUNTER — Telehealth: Payer: Self-pay | Admitting: Anesthesiology

## 2022-08-20 ENCOUNTER — Other Ambulatory Visit: Payer: Self-pay | Admitting: Neurology

## 2022-08-20 ENCOUNTER — Other Ambulatory Visit (HOSPITAL_COMMUNITY): Payer: Self-pay

## 2022-08-20 DIAGNOSIS — G43009 Migraine without aura, not intractable, without status migrainosus: Secondary | ICD-10-CM

## 2022-08-20 NOTE — Telephone Encounter (Signed)
Pt called stating she will need a Rx for Aimovig and PA now that she has a new insurance. New insurance information is on file. Her appt with Dr Tomi Likens is on 09/12/22.

## 2022-08-25 NOTE — Telephone Encounter (Addendum)
Patient is calling in as she has not heard back about the authorization. Asking for an update. 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 Is due for shot in two weeks.

## 2022-08-26 ENCOUNTER — Other Ambulatory Visit (HOSPITAL_COMMUNITY): Payer: Self-pay

## 2022-08-26 MED ORDER — AIMOVIG 140 MG/ML ~~LOC~~ SOAJ
140.0000 mg | SUBCUTANEOUS | 0 refills | Status: DC
  Filled 2022-08-26: qty 1, 28d supply, fill #0

## 2022-09-02 NOTE — Progress Notes (Unsigned)
[name] [name] [address] [place] Phone: [phone] Subjective:   [name], am serving as a scribe for Dr. Hulan Saas.  I'm seeing this patient by the request  of:  Mallory Cruel, MD  CC: Neck and back pain follow-up  YIA:[id number]  [name] is a 57 y.o. female coming in with complaint of back and neck pain. OMT on 07/23/2022. Patient states here for manipulation. No new issues.  Medications patient has been prescribed: Zanaflex  Taking:         Reviewed prior external information including notes and imaging from previsou exam, outside providers and external EMR if available.   As well as notes that were available from care everywhere and other healthcare systems.  Past medical history, social, surgical and family history all reviewed in electronic medical record.  No pertanent information unless stated regarding to the chief complaint.   Past Medical History:  Diagnosis Date   History of ectopic pregnancy    04/ 2007  S/P LEFT SALPINGECTOMY   History of kidney stones    Hypothyroidism    Menorrhagia    Migraine     No Known Allergies   Review of Systems:  No  visual changes, nausea, vomiting, diarrhea, constipation, dizziness, abdominal pain, skin rash, fevers, chills, night sweats, weight loss, swollen lymph nodes, body aches, joint swelling, chest pain, shortness of breath, mood changes. POSITIVE muscle aches, headache  Objective  Blood pressure (!) 132/90, pulse 71, height '5\' 7"'$  (1.702 m), weight 197 lb (89.4 kg), SpO2 99 %.   General: No apparent distress alert and oriented x3 mood and affect normal, dressed appropriately.  HEENT: Pupils equal, extraocular movements intact  Respiratory: Patient's speak in full sentences and does not appear short of breath  Cardiovascular: No lower extremity edema, non tender, no erythema  Neck exam does have significant tightness noted.  Limited sidebending  bilaterally.  Patient does have tenderness to palpation also in the thoracolumbar juncture.  Patient does have mild tightness with FABER test bilaterally.  Osteopathic findings  C2 flexed rotated and side bent right C3 flexed rotated and side bent left C5 flexed rotated and side bent right T3 extended rotated and side bent right inhaled rib L4 flexed rotated and side bent right Sacrum right on right       Assessment and Plan:  Cervicogenic headache Worsening cervicogenic headaches.  Patient does think that there is a possibility with her having more nighttime pain that this could be associated with snoring as well.  Will refer patient for a sleep study to see if this is potentially playing.  Strong family history with her mother and both siblings having to use CPAP.  Patient to continue to work on the home exercises.  Continue to follow-up with neurology.  Gabapentin discussed.  Anti-inflammatories discussed.  Follow-up again in 5 to 6 weeks    Nonallopathic problems  Decision today to treat with OMT was based on Physical Exam  After verbal consent patient was treated with HVLA, ME, FPR techniques in cervical, rib, thoracic, lumbar, and sacral  areas  Patient tolerated the procedure well with improvement in symptoms  Patient given exercises, stretches and lifestyle modifications  See medications in patient instructions if given  Patient will follow up in 4-8 weeks     The above documentation has been reviewed and is accurate and complete Mallory Pulley, DO         Note: This dictation  was prepared with Dragon dictation along with smaller phrase technology. Any transcriptional errors that result from this process are unintentional.

## 2022-09-03 ENCOUNTER — Ambulatory Visit: Payer: Self-pay | Admitting: Family Medicine

## 2022-09-03 ENCOUNTER — Encounter: Payer: Self-pay | Admitting: Family Medicine

## 2022-09-03 ENCOUNTER — Ambulatory Visit: Payer: Commercial Managed Care - PPO | Admitting: Family Medicine

## 2022-09-03 VITALS — BP 132/90 | HR 71 | Ht 67.0 in | Wt 197.0 lb

## 2022-09-03 DIAGNOSIS — M9902 Segmental and somatic dysfunction of thoracic region: Secondary | ICD-10-CM | POA: Diagnosis not present

## 2022-09-03 DIAGNOSIS — M9908 Segmental and somatic dysfunction of rib cage: Secondary | ICD-10-CM | POA: Diagnosis not present

## 2022-09-03 DIAGNOSIS — M9903 Segmental and somatic dysfunction of lumbar region: Secondary | ICD-10-CM | POA: Diagnosis not present

## 2022-09-03 DIAGNOSIS — G4486 Cervicogenic headache: Secondary | ICD-10-CM | POA: Diagnosis not present

## 2022-09-03 DIAGNOSIS — M9904 Segmental and somatic dysfunction of sacral region: Secondary | ICD-10-CM | POA: Diagnosis not present

## 2022-09-03 DIAGNOSIS — R0683 Snoring: Secondary | ICD-10-CM | POA: Diagnosis not present

## 2022-09-03 DIAGNOSIS — M9901 Segmental and somatic dysfunction of cervical region: Secondary | ICD-10-CM | POA: Diagnosis not present

## 2022-09-03 NOTE — Assessment & Plan Note (Signed)
Worsening cervicogenic headaches.  Patient does think that there is a possibility with her having more nighttime pain that this could be associated with snoring as well.  Will refer patient for a sleep study to see if this is potentially playing.  Strong family history with her mother and both siblings having to use CPAP.  Patient to continue to work on the home exercises.  Continue to follow-up with neurology.  Gabapentin discussed.  Anti-inflammatories discussed.  Follow-up again in 5 to 6 weeks

## 2022-09-03 NOTE — Patient Instructions (Addendum)
Referral to pulmonology for sleep study See you again in 6-8 weeks

## 2022-09-04 ENCOUNTER — Other Ambulatory Visit (HOSPITAL_COMMUNITY): Payer: Self-pay

## 2022-09-08 ENCOUNTER — Telehealth: Payer: Self-pay | Admitting: Pharmacy Technician

## 2022-09-08 NOTE — Telephone Encounter (Signed)
Patient Advocate Encounter   Received notification that prior authorization for Ubrelvy '100MG'$  tablets is required.   PA submitted on 09/08/2022 Key [id number] Status is pending       Lyndel Safe, [place] Patient Advocate Specialist [facility] Patient Advocate Team Direct Number: [phone]  Fax: [phone]
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
## 2022-09-12 ENCOUNTER — Encounter: Payer: Self-pay | Admitting: Neurology

## 2022-09-12 ENCOUNTER — Ambulatory Visit: Payer: Commercial Managed Care - PPO | Admitting: Neurology

## 2022-09-12 ENCOUNTER — Other Ambulatory Visit (HOSPITAL_COMMUNITY): Payer: Self-pay

## 2022-09-12 DIAGNOSIS — G43009 Migraine without aura, not intractable, without status migrainosus: Secondary | ICD-10-CM

## 2022-09-12 MED ORDER — UBRELVY 100 MG PO TABS
ORAL_TABLET | ORAL | 11 refills | Status: DC
  Filled 2022-09-12: qty 16, 30d supply, fill #0
  Filled 2022-10-10: qty 16, 30d supply, fill #1
  Filled 2022-11-05: qty 16, 30d supply, fill #2
  Filled 2022-12-04: qty 16, 30d supply, fill #3
  Filled 2023-01-03: qty 16, 30d supply, fill #4
  Filled 2023-01-31: qty 16, 30d supply, fill #5
  Filled 2023-03-02: qty 16, 30d supply, fill #6
  Filled 2023-04-01: qty 16, 30d supply, fill #7
  Filled 2023-05-08: qty 16, 30d supply, fill #8
  Filled 2023-05-30: qty 16, 30d supply, fill #9
  Filled 2023-07-02: qty 16, 30d supply, fill #10
  Filled 2023-08-01 (×2): qty 16, 30d supply, fill #11

## 2022-09-12 MED ORDER — AIMOVIG 140 MG/ML ~~LOC~~ SOAJ
140.0000 mg | SUBCUTANEOUS | 11 refills | Status: DC
  Filled 2022-09-12 – 2022-09-26 (×2): qty 1, 28d supply, fill #0
  Filled 2022-10-19: qty 1, 28d supply, fill #1
  Filled 2022-11-19: qty 1, 28d supply, fill #2
  Filled 2022-12-16 – 2022-12-18 (×3): qty 1, 28d supply, fill #3
  Filled 2023-01-15: qty 1, 28d supply, fill #4
  Filled 2023-02-11: qty 1, 28d supply, fill #5
  Filled 2023-03-02 – 2023-03-06 (×2): qty 1, 28d supply, fill #6
  Filled 2023-04-06: qty 1, 28d supply, fill #7
  Filled 2023-05-07: qty 1, 30d supply, fill #8
  Filled 2023-07-01: qty 1, 30d supply, fill #9
  Filled 2023-07-29: qty 1, 30d supply, fill #10
  Filled 2023-08-26: qty 1, 30d supply, fill #11

## 2022-09-12 NOTE — Patient Instructions (Signed)
Continue Aimovig and [name] See sleep specialist as scheduled Follow up one year or as needed

## 2022-09-16 ENCOUNTER — Other Ambulatory Visit (HOSPITAL_COMMUNITY): Payer: Self-pay

## 2022-09-16 ENCOUNTER — Other Ambulatory Visit: Payer: Self-pay

## 2022-09-16 NOTE — Telephone Encounter (Signed)
PA has been APPROVED from 09/08/2022 to 09/08/2023

## 2022-09-17 ENCOUNTER — Other Ambulatory Visit (HOSPITAL_COMMUNITY): Payer: Self-pay

## 2022-09-17 MED ORDER — IRBESARTAN 75 MG PO TABS
75.0000 mg | ORAL_TABLET | Freq: Every day | ORAL | 0 refills | Status: DC
  Filled 2022-09-17: qty 30, 30d supply, fill #0

## 2022-09-18 ENCOUNTER — Encounter: Payer: Self-pay | Admitting: Adult Health

## 2022-09-18 ENCOUNTER — Ambulatory Visit: Payer: Commercial Managed Care - PPO | Admitting: Adult Health

## 2022-09-18 VITALS — BP 128/80 | HR 89 | Temp 97.8°F | Ht 67.0 in | Wt 197.2 lb

## 2022-09-18 DIAGNOSIS — R400 Somnolence: Secondary | ICD-10-CM

## 2022-09-18 NOTE — Assessment & Plan Note (Signed)
Daytime sleepiness, restless sleep, dry mouth, chronic headaches, strong family history of sleep apnea all suspicious for underlying sleep apnea.  Will set patient up for home sleep study.  Patient education on sleep apnea given  - discussed how weight can impact sleep and risk for sleep disordered breathing - discussed options to assist with weight loss: combination of diet modification, cardiovascular and strength training exercises   - had an extensive discussion regarding the adverse health consequences related to untreated sleep disordered breathing - specifically discussed the risks for hypertension, coronary artery disease, cardiac dysrhythmias, cerebrovascular disease, and diabetes - lifestyle modification discussed   - discussed how sleep disruption can increase risk of accidents, particularly when driving - safe driving practices were discussed   Plan  Patient Instructions  Set up home sleep study.  Work on healthy sleep regimen  Do not drive is sleepy.  Work on healthy weight  Follow up in 3 months to discuss results and treatment .

## 2022-09-18 NOTE — Progress Notes (Signed)
Reviewed and agree with assessment/plan.   Chesley Mires, MD [facility] Pulmonary/Critical Care 09/18/2022, 10:08 AM Pager:  [phone]

## 2022-09-18 NOTE — Patient Instructions (Signed)
Set up home sleep study.  Work on healthy sleep regimen  Do not drive is sleepy.  Work on healthy weight  Follow up in 3 months to discuss results and treatment .

## 2022-09-18 NOTE — Addendum Note (Signed)
Addended by: Vanessa Barbara on: 09/18/2022 09:21 AM   Modules accepted: Orders

## 2022-09-18 NOTE — Progress Notes (Signed)
$'@Patient'O$  ID: [name], female    DOB: 1965-11-29, 57 y.o.   MRN: [id number]  Chief Complaint  Patient presents with   Consult    Referring provider: Lawerance Cruel, MD  HPI: 57 year old female seen for sleep consult September 18, 2022 for restless sleep and daytime sleepiness  TEST/EVENTS :   09/18/2022 Sleep consult Patient presents for sleep consult today for restless sleep daytime sleepiness and headaches.  Patient was kindly referred by Dr. Hulan Saas.  Patient has a very strong family history of sleep apnea.  Wakes up feeling very tired.  Also wakes up with headaches and dry mouth. Being treated for neck pain and chronic headaches.  Patient does take Zanaflex on occasion . Also follows with Neurology for headaches. No CHF or CVA . No symptoms suspicious for Cataplexy or Sleep paralysis . No naps. Rare caffeine.  Can go to sleep easily but wakes up frequently . Takes Ativan 1 mg Twice daily  for panic attacks .  Typically goes to bed around 10 pm , up 3 times each night , Gets up at 6 am. Weight  Epworth score is 3 out of 24. Gets sleepy if she sits down to rest or read a book.  No removeable dental work.  Current weight is is 197 pounds with a BMI at 30  Medical history significant for hypertension, chronic allergies, chronic headaches, hypothyroidism, hip bursitis  Surgical history positive for left wrist surgery and carpal tunnel on left  Social history.  Patient is widowed.  She works full-time as a Research scientist (physical sciences).  She has no children.  She is originally from [place].  Moved to [place] 20 years ago.  She is a never smoker.  Rare alcohol use.  No drug use.  Family history positive for liver cancer, sleep apnea, prostate cancer.  No Known Allergies  Immunization History  Administered Date(s) Administered   Influenza Split 06/15/2009   Influenza,inj,Quad PF,6+ Mos 05/09/2016   Influenza-Unspecified 05/17/2022   PFIZER(Purple Top)SARS-COV-2 Vaccination  08/29/2019, 09/16/2019   Pfizer Covid-19 Vaccine Bivalent Booster 3yr & up 06/08/2020   Tdap 03/08/2010, 02/06/2022, 03/03/2022   Zoster, Live 01/27/2020, 05/11/2020    Past Medical History:  Diagnosis Date   History of ectopic pregnancy    04/ 2007  S/P LEFT SALPINGECTOMY   History of kidney stones    Hypothyroidism    Menorrhagia    Migraine     Tobacco History: Social History   Tobacco Use  Smoking Status Never  Smokeless Tobacco Never   Counseling given: Not Answered   Outpatient Medications Prior to Visit  Medication Sig Dispense Refill   Erenumab-aooe (AIMOVIG) 140 MG/ML SOAJ Inject 140 mg under the skin every 28 days as directed. 1 mL 11   Ibuprofen 200 MG CAPS Take by mouth daily as needed.     irbesartan (AVAPRO) 75 MG tablet Take 1 tablet (75 mg total) by mouth daily. 30 tablet 0   levothyroxine (SYNTHROID) 88 MCG tablet Take 1 tablet by mouth once daily in the morning on an empty stomach 90 tablet 4   LORazepam (ATIVAN) 1 MG tablet Take 1/2 - 1 tablet by mouth every 8 hours as needed for anxiety. 60 tablet 5   Multiple Vitamin (MULTIVITAMIN WITH MINERALS) TABS tablet Take 1 tablet by mouth daily.     sertraline (ZOLOFT) 100 MG tablet Take 1 tablet (100 mg total) by mouth daily. 90 tablet 1   tiZANidine (ZANAFLEX) 2 MG tablet Take 1 tablet by  mouth at bedtime. (Patient taking differently: Take 2 mg by mouth at bedtime as needed.) 30 tablet 0   Ubrogepant (UBRELVY) 100 MG TABS Take 1 tablet as needed.  May repeat in 2 hours.  Maximum 2 tablets in 24 hours. 16 tablet 11   benzonatate (TESSALON) 100 MG capsule Take 1 capsule (100 mg total) by mouth 3 (three) times daily as needed. (Patient not taking: Reported on 09/12/2022) 30 capsule 0   fluticasone (FLONASE) 50 MCG/ACT nasal spray Place 2 sprays into both nostrils daily. 16 g 0   No facility-administered medications prior to visit.     Review of Systems:   Constitutional:   No  weight loss, night sweats,   Fevers, chills, fatigue, or  lassitude.  HEENT:   No  Difficulty swallowing,  Tooth/dental problems, or  Sore throat,                No sneezing, itching, ear ache, nasal congestion, post nasal drip,   CV:  No chest pain,  Orthopnea, PND, swelling in lower extremities, anasarca, dizziness, palpitations, syncope.   GI  No heartburn, indigestion, abdominal pain, nausea, vomiting, diarrhea, change in bowel habits, loss of appetite, bloody stools.   Resp: No shortness of breath with exertion or at rest.  No excess mucus, no productive cough,  No non-productive cough,  No coughing up of blood.  No change in color of mucus.  No wheezing.  No chest wall deformity  Skin: no rash or lesions.  GU: no dysuria, change in color of urine, no urgency or frequency.  No flank pain, no hematuria   MS:  No joint pain or swelling.  No decreased range of motion.  No back pain.    Physical Exam  BP 128/80 (BP Location: Left Arm, Patient Position: Sitting, Cuff Size: Large)   Pulse 89   Temp 97.8 F (36.6 C) (Oral)   Ht '5\' 7"'$  (1.702 m)   Wt 197 lb 3.2 oz (89.4 kg)   LMP  (LMP Unknown) Comment: been over 1 year  SpO2 95%   BMI 30.89 kg/m   GEN: A/Ox3; pleasant , NAD, well nourished    HEENT:  Finger/AT,   NOSE-clear, THROAT-clear, no lesions, no postnasal drip or exudate noted.  Class 2-3 MP airway   NECK:  Supple w/ fair ROM; no JVD; normal carotid impulses w/o bruits; no thyromegaly or nodules palpated; no lymphadenopathy.    RESP  Clear  P & A; w/o, wheezes/ rales/ or rhonchi. no accessory muscle use, no dullness to percussion  CARD:  RRR, no m/r/g, no peripheral edema, pulses intact, no cyanosis or clubbing.  GI:   Soft & nt; nml bowel sounds; no organomegaly or masses detected.   Musco: Warm bil, no deformities or joint swelling noted.   Neuro: alert, no focal deficits noted.    Skin: Warm, no lesions or rashes    Lab Results:   BNP No results found for: "BNP"  ProBNP No results  found for: "PROBNP"  Imaging: No results found.        No data to display          No results found for: "NITRICOXIDE"      Assessment & Plan:   Daytime sleepiness Daytime sleepiness, restless sleep, dry mouth, chronic headaches, strong family history of sleep apnea all suspicious for underlying sleep apnea.  Will set patient up for home sleep study.  Patient education on sleep apnea given  - discussed how weight can  impact sleep and risk for sleep disordered breathing - discussed options to assist with weight loss: combination of diet modification, cardiovascular and strength training exercises   - had an extensive discussion regarding the adverse health consequences related to untreated sleep disordered breathing - specifically discussed the risks for hypertension, coronary artery disease, cardiac dysrhythmias, cerebrovascular disease, and diabetes - lifestyle modification discussed   - discussed how sleep disruption can increase risk of accidents, particularly when driving - safe driving practices were discussed   Plan  Patient Instructions  Set up home sleep study.  Work on healthy sleep regimen  Do not drive is sleepy.  Work on healthy weight  Follow up in 3 months to discuss results and treatment .       Rexene Edison, NP 09/18/2022

## 2022-09-26 ENCOUNTER — Other Ambulatory Visit (HOSPITAL_COMMUNITY): Payer: Self-pay

## 2022-09-26 ENCOUNTER — Other Ambulatory Visit: Payer: Self-pay

## 2022-10-06 ENCOUNTER — Telehealth: Payer: Commercial Managed Care - PPO | Admitting: Physician Assistant

## 2022-10-06 ENCOUNTER — Other Ambulatory Visit (HOSPITAL_COMMUNITY): Payer: Self-pay

## 2022-10-06 DIAGNOSIS — H109 Unspecified conjunctivitis: Secondary | ICD-10-CM

## 2022-10-06 DIAGNOSIS — B9689 Other specified bacterial agents as the cause of diseases classified elsewhere: Secondary | ICD-10-CM

## 2022-10-06 MED ORDER — POLYMYXIN B-TRIMETHOPRIM 10000-0.1 UNIT/ML-% OP SOLN
1.0000 [drp] | OPHTHALMIC | 0 refills | Status: DC
  Filled 2022-10-06: qty 10, 5d supply, fill #0

## 2022-10-06 NOTE — Progress Notes (Signed)

## 2022-10-08 ENCOUNTER — Other Ambulatory Visit (HOSPITAL_COMMUNITY): Payer: Self-pay

## 2022-10-08 DIAGNOSIS — I10 Essential (primary) hypertension: Secondary | ICD-10-CM | POA: Diagnosis not present

## 2022-10-08 DIAGNOSIS — Z6831 Body mass index (BMI) 31.0-31.9, adult: Secondary | ICD-10-CM | POA: Diagnosis not present

## 2022-10-08 MED ORDER — IRBESARTAN 150 MG PO TABS
150.0000 mg | ORAL_TABLET | Freq: Every day | ORAL | 6 refills | Status: DC
  Filled 2022-10-08: qty 30, 30d supply, fill #0
  Filled 2022-11-05: qty 30, 30d supply, fill #1
  Filled 2022-12-04: qty 30, 30d supply, fill #2
  Filled 2023-01-03: qty 30, 30d supply, fill #3
  Filled 2023-01-31: qty 30, 30d supply, fill #4
  Filled 2023-03-19 – 2023-04-01 (×3): qty 30, 30d supply, fill #5

## 2022-10-09 ENCOUNTER — Other Ambulatory Visit (HOSPITAL_COMMUNITY): Payer: Self-pay

## 2022-10-15 ENCOUNTER — Telehealth: Payer: Commercial Managed Care - PPO | Admitting: Physician Assistant

## 2022-10-15 ENCOUNTER — Other Ambulatory Visit (HOSPITAL_COMMUNITY): Payer: Self-pay

## 2022-10-15 DIAGNOSIS — H10413 Chronic giant papillary conjunctivitis, bilateral: Secondary | ICD-10-CM | POA: Diagnosis not present

## 2022-10-15 DIAGNOSIS — H109 Unspecified conjunctivitis: Secondary | ICD-10-CM

## 2022-10-15 MED ORDER — NEOMYCIN-POLYMYXIN-DEXAMETH 3.5-10000-0.1 OP SUSP
OPHTHALMIC | 0 refills | Status: DC
  Filled 2022-10-15: qty 5, 14d supply, fill #0

## 2022-10-15 NOTE — Progress Notes (Signed)
Because you have failed first line treatments and are still having symptoms, I feel your condition warrants further evaluation and I recommend that you be seen in a face to face visit.   NOTE: There will be NO CHARGE for this eVisit   If you are having a true medical emergency please call 911.      For an urgent face to face visit, [facility] has eight urgent care centers for your convenience:   NEW!! [facility] [place] [facility] [place] Get Driving Directions [id number] [address] [place], [place] [facility] [place] [facility] [place] Get Driving Directions [id number] [place], [place] [address]   [facility]) Get Driving Directions [id number] [address], [place] [address]  [facility] [place] ([place]) Get Driving Directions [id number] [address] [place],  [place]  [address]  [facility] - at [address] [place] Get Driving Directions  [id number] [phone] [address] [place],  [place] [address]   [facility] Get Driving Directions [id number] [address], [place], [place] [address]   [facility] Get Driving Directions  [id number] [address] [place], [place] [address]   [facility] [place] Get Driving Directions [id number] [address] [place],  [address]  Your MyChart E-visit questionnaire answers were reviewed by a board certified advanced clinical practitioner to complete your personal care plan based on your specific symptoms.  Thank you for using e-Visits.   I have spent 5 minutes in review of e-visit questionnaire, review and updating patient chart, medical decision making and response to patient.   Mar Daring, PA-C

## 2022-10-17 ENCOUNTER — Other Ambulatory Visit: Payer: Self-pay

## 2022-10-21 NOTE — Progress Notes (Unsigned)
[place] Phone: [phone] Subjective:   [name], am serving as a scribe for Dr. Hulan Saas.  I'm seeing this patient by the request  of:  Lawerance Cruel, MD  CC: Back and neck pain follow-up  [id number]  [name] is a 57 y.o. female coming in with complaint of back and neck pain. OMT on 09/03/2022. Patient states that her upper back and lower back are achy. Shoulders are tight. Notes more pain in the morning.   Medications patient has been prescribed:   Taking:         Reviewed prior external information including notes and imaging from previsou exam, outside providers and external EMR if available.   As well as notes that were available from care everywhere and other healthcare systems.  Past medical history, social, surgical and family history all reviewed in electronic medical record.  Cook pertanent information unless stated regarding to the chief complaint.   Past Medical History:  Diagnosis Date   History of ectopic pregnancy    04/ 2007  S/P LEFT SALPINGECTOMY   History of kidney stones    Hypothyroidism    Menorrhagia    Migraine     Cook Known Allergies   Review of Systems:  Cook visual changes, nausea, vomiting, diarrhea, constipation, dizziness, abdominal pain, skin rash, fevers, chills, night sweats, weight loss, swollen lymph nodes, body aches, joint swelling, chest pain, shortness of breath, mood changes. POSITIVE muscle aches, headache   Objective  Blood pressure 112/84, pulse 77, height '5\' 7"'$  (1.702 m), weight 194 lb (88 kg), SpO2 96 %.   General: Cook apparent distress alert and oriented x3 mood and affect normal, dressed appropriately.  HEENT: Pupils equal, extraocular movements intact  Respiratory: Patient's speak in full sentences and does not appear short of breath  Cardiovascular: Cook lower extremity edema, non tender, Cook erythema  MSK:  Significant  tightness of the neck bilaterally.  Tightness in the parascapular region actually bilaterally.  Significant tightness in the right sacroiliac joint area.  Positive FABER test on the right.  Osteopathic findings  C3 flexed rotated and side bent right C5 flexed rotated and side bent right T3 extended rotated and side bent right inhaled rib T6 extended rotated and side bent left L2 flexed rotated and side bent right Sacrum right on right       Assessment and Plan:  Cervicogenic headache Patient does have more of a cervicogenic headache still.  Has had some tightness noted.  Some increasing tightness in the lower back as well.  Patient does not know if anything has been contributing to infection.  We do have some medications that can be beneficial.  Encouraged her to continue to work on weight trying to get BMI less than 30 at least. Follow-up with me again in 6 to 8 weeks.    Nonallopathic problems  Decision today to treat with OMT was based on Physical Exam  After verbal consent patient was treated with HVLA, ME, FPR techniques in cervical, rib, thoracic, lumbar, and sacral  areas  Patient tolerated the procedure well with improvement in symptoms  Patient given exercises, stretches and lifestyle modifications  See medications in patient instructions if given  Patient will follow up in 4-8 weeks    The above documentation has been reviewed and is accurate and complete Mallory Pulley, DO          Note: This dictation was  prepared with Dragon dictation along with smaller phrase technology. Any transcriptional errors that result from this process are unintentional.

## 2022-10-22 ENCOUNTER — Encounter: Payer: Self-pay | Admitting: Family Medicine

## 2022-10-22 ENCOUNTER — Ambulatory Visit: Payer: Commercial Managed Care - PPO | Admitting: Family Medicine

## 2022-10-22 VITALS — BP 112/84 | HR 77 | Ht 67.0 in | Wt 194.0 lb

## 2022-10-22 DIAGNOSIS — M9903 Segmental and somatic dysfunction of lumbar region: Secondary | ICD-10-CM

## 2022-10-22 DIAGNOSIS — M9902 Segmental and somatic dysfunction of thoracic region: Secondary | ICD-10-CM

## 2022-10-22 DIAGNOSIS — G4486 Cervicogenic headache: Secondary | ICD-10-CM

## 2022-10-22 DIAGNOSIS — M9904 Segmental and somatic dysfunction of sacral region: Secondary | ICD-10-CM | POA: Diagnosis not present

## 2022-10-22 DIAGNOSIS — M9908 Segmental and somatic dysfunction of rib cage: Secondary | ICD-10-CM

## 2022-10-22 DIAGNOSIS — M9901 Segmental and somatic dysfunction of cervical region: Secondary | ICD-10-CM | POA: Diagnosis not present

## 2022-10-22 NOTE — Assessment & Plan Note (Signed)
Patient does have more of a cervicogenic headache still.  Has had some tightness noted.  Some increasing tightness in the lower back as well.  Patient does not know if anything has been contributing to infection.  We do have some medications that can be beneficial.  Encouraged her to continue to work on weight trying to get BMI less than 30 at least. Follow-up with me again in 6 to 8 weeks.

## 2022-10-22 NOTE — Patient Instructions (Signed)
Good to see you Have a great bday celebration with the donkeys See me in 8 weeks

## 2022-10-23 ENCOUNTER — Other Ambulatory Visit (HOSPITAL_COMMUNITY): Payer: Self-pay

## 2022-10-24 ENCOUNTER — Ambulatory Visit (INDEPENDENT_AMBULATORY_CARE_PROVIDER_SITE_OTHER): Payer: Commercial Managed Care - PPO

## 2022-10-24 DIAGNOSIS — R400 Somnolence: Secondary | ICD-10-CM

## 2022-10-26 DIAGNOSIS — G4733 Obstructive sleep apnea (adult) (pediatric): Secondary | ICD-10-CM | POA: Diagnosis not present

## 2022-10-27 ENCOUNTER — Encounter: Payer: Self-pay | Admitting: Adult Health

## 2022-10-31 ENCOUNTER — Ambulatory Visit: Payer: Commercial Managed Care - PPO | Admitting: Adult Health

## 2022-10-31 ENCOUNTER — Encounter: Payer: Self-pay | Admitting: Adult Health

## 2022-10-31 VITALS — BP 114/84 | HR 76 | Temp 97.6°F | Ht 67.0 in | Wt 198.4 lb

## 2022-10-31 DIAGNOSIS — G4733 Obstructive sleep apnea (adult) (pediatric): Secondary | ICD-10-CM | POA: Diagnosis not present

## 2022-10-31 NOTE — Assessment & Plan Note (Signed)
Moderate obstructive sleep apnea noted on home sleep study.  We went over her sleep study results in detail.  Went over treatment options.  Patient proceed with CPAP therapy AutoSet 5 to 15 cm.  We use a DreamWear nasal mask.  Plan  Patient Instructions  Begin CPAP At bedtime, wear all night long for at least 6hr or more Try Dream wear nasal mask  Work on healthy sleep regimen  Saline nasal spray Twice daily   Saline nasal gel At bedtime  (AYR)  Do not drive is sleepy.  Work on healthy weight  Follow up in 3 months and As needed

## 2022-10-31 NOTE — Progress Notes (Signed)
@Patient  ID: [name], female    DOB: [birth year], 57 y.o.   MRN: [id number]  Chief Complaint  Patient presents with   Follow-up    Referring provider: Lawerance Cruel, MD  HPI: 57 year old female seen for sleep consult September 18, 2022 for daytime sleepiness found to have moderate obstructive sleep apnea  TEST/EVENTS :  Home sleep study October 12, 2022 showed moderate sleep apnea with AHI at 20/hour and SpO2 low at 78%.  10/31/2022 Follow up ; OSA  Patient returns for a 6-week follow-up.  Patient was seen last visit for sleep consult.  Patient complained of restless sleep and daytime sleepiness.  She also had a strong family history of sleep apnea.  She had a home sleep study was completed on October 12, 2022 that showed moderate obstructive sleep apnea with AHI at 20.1/hour and SpO2 low at 78%. We reviewed her sleep study results in detail.  Went over treatment options including weight loss oral appliance and CPAP therapy.  Patient like to proceed with CPAP therapy.  We went over different CPAP mask options.  She will like to proceed with the DreamWear nasal mask.   No Known Allergies  Immunization History  Administered Date(s) Administered   Influenza Split 06/15/2009   Influenza,inj,Quad PF,6+ Mos 05/09/2016   Influenza-Unspecified 05/17/2022   PFIZER(Purple Top)SARS-COV-2 Vaccination 08/29/2019, 09/16/2019   Pfizer Covid-19 Vaccine Bivalent Booster 71yrs & up 06/08/2020   Tdap 03/08/2010, 02/06/2022, 03/03/2022   Zoster, Live 01/27/2020, 05/11/2020    Past Medical History:  Diagnosis Date   History of ectopic pregnancy    04/ 2007  S/P LEFT SALPINGECTOMY   History of kidney stones    Hypothyroidism    Menorrhagia    Migraine     Tobacco History: Social History   Tobacco Use  Smoking Status Never  Smokeless Tobacco Never   Counseling given: Not Answered   Outpatient Medications Prior to Visit  Medication Sig Dispense Refill   Erenumab-aooe (AIMOVIG) 140  MG/ML SOAJ Inject 140 mg under the skin every 28 days as directed. 1 mL 11   Ibuprofen 200 MG CAPS Take by mouth daily as needed.     irbesartan (AVAPRO) 150 MG tablet Take 1 tablet (150 mg total) by mouth daily. 30 tablet 6   irbesartan (AVAPRO) 75 MG tablet Take 1 tablet (75 mg total) by mouth daily. 30 tablet 0   levothyroxine (SYNTHROID) 88 MCG tablet Take 1 tablet by mouth once daily in the morning on an empty stomach 90 tablet 4   LORazepam (ATIVAN) 1 MG tablet Take 1/2 - 1 tablet by mouth every 8 hours as needed for anxiety. 60 tablet 5   Multiple Vitamin (MULTIVITAMIN WITH MINERALS) TABS tablet Take 1 tablet by mouth daily.     sertraline (ZOLOFT) 100 MG tablet Take 1 tablet (100 mg total) by mouth daily. 90 tablet 1   tiZANidine (ZANAFLEX) 2 MG tablet Take 1 tablet by mouth at bedtime. (Patient taking differently: Take 2 mg by mouth at bedtime as needed.) 30 tablet 0   Ubrogepant (UBRELVY) 100 MG TABS Take 1 tablet as needed.  May repeat in 2 hours.  Maximum 2 tablets in 24 hours. 16 tablet 11   neomycin-polymyxin b-dexamethasone (MAXITROL) 3.5-10000-0.1 SUSP Place 1 drop in both eyes four times daily for 1 week.  Then use twice daily for 2nd week as directed. (Patient not taking: Reported on 10/31/2022) 5 mL 0   trimethoprim-polymyxin b (POLYTRIM) ophthalmic solution Place 1 drop into  both eyes every 4 hours for 5 days (Patient not taking: Reported on 10/31/2022) 10 mL 0   No facility-administered medications prior to visit.     Review of Systems:   Constitutional:   No  weight loss, night sweats,  Fevers, chills,  +fatigue, or  lassitude.  HEENT:   No headaches,  Difficulty swallowing,  Tooth/dental problems, or  Sore throat,                No sneezing, itching, ear ache, nasal congestion, post nasal drip,   CV:  No chest pain,  Orthopnea, PND, swelling in lower extremities, anasarca, dizziness, palpitations, syncope.   GI  No heartburn, indigestion, abdominal pain, nausea,  vomiting, diarrhea, change in bowel habits, loss of appetite, bloody stools.   Resp: No shortness of breath with exertion or at rest.  No excess mucus, no productive cough,  No non-productive cough,  No coughing up of blood.  No change in color of mucus.  No wheezing.  No chest wall deformity  Skin: no rash or lesions.  GU: no dysuria, change in color of urine, no urgency or frequency.  No flank pain, no hematuria   MS:  No joint pain or swelling.  No decreased range of motion.  No back pain.    Physical Exam  BP 114/84 (BP Location: Left Arm, Patient Position: Sitting, Cuff Size: Large)   Pulse 76   Temp 97.6 F (36.4 C) (Oral)   Ht 5\' 7"  (1.702 m)   Wt 198 lb 6.4 oz (90 kg)   LMP  (LMP Unknown) Comment: been over 1 year  SpO2 97%   BMI 31.07 kg/m   GEN: A/Ox3; pleasant , NAD, well nourished    HEENT:  [place]/AT,   NOSE-clear, THROAT-clear, no lesions, no postnasal drip or exudate noted.  Class II-III MP airway  NECK:  Supple w/ fair ROM; no JVD; normal carotid impulses w/o bruits; no thyromegaly or nodules palpated; no lymphadenopathy.    RESP  Clear  P & A; w/o, wheezes/ rales/ or rhonchi. no accessory muscle use, no dullness to percussion  CARD:  RRR, no m/r/g, no peripheral edema, pulses intact, no cyanosis or clubbing.  GI:   Soft & nt; nml bowel sounds; no organomegaly or masses detected.   Musco: Warm bil, no deformities or joint swelling noted.   Neuro: alert, no focal deficits noted.    Skin: Warm, no lesions or rashes    Lab Results:  CBC  BNP No results found for: "BNP"  ProBNP No results found for: "PROBNP"  Imaging: No results found.        No data to display          No results found for: "NITRICOXIDE"      Assessment & Plan:   OSA (obstructive sleep apnea) Moderate obstructive sleep apnea noted on home sleep study.  We went over her sleep study results in detail.  Went over treatment options.  Patient proceed with CPAP therapy  AutoSet 5 to 15 cm.  We use a DreamWear nasal mask.  Plan  Patient Instructions  Begin CPAP At bedtime, wear all night long for at least 6hr or more Try Dream wear nasal mask  Work on healthy sleep regimen  Saline nasal spray Twice daily   Saline nasal gel At bedtime  (AYR)  Do not drive is sleepy.  Work on healthy weight  Follow up in 3 months and As needed  Rexene Edison, NP 10/31/2022

## 2022-10-31 NOTE — Progress Notes (Signed)
Reviewed and agree with assessment/plan.   Chesley Mires, MD [facility] Pulmonary/Critical Care 10/31/2022, 12:01 PM Pager:  [phone]

## 2022-10-31 NOTE — Patient Instructions (Signed)
Begin CPAP At bedtime, wear all night long for at least 6hr or more Try Dream wear nasal mask  Work on healthy sleep regimen  Saline nasal spray Twice daily   Saline nasal gel At bedtime  (AYR)  Do not drive is sleepy.  Work on healthy weight  Follow up in 3 months and As needed

## 2022-11-15 ENCOUNTER — Other Ambulatory Visit (HOSPITAL_COMMUNITY): Payer: Self-pay

## 2022-11-25 ENCOUNTER — Other Ambulatory Visit (HOSPITAL_COMMUNITY): Payer: Self-pay

## 2022-11-25 DIAGNOSIS — G4733 Obstructive sleep apnea (adult) (pediatric): Secondary | ICD-10-CM | POA: Diagnosis not present

## 2022-12-05 DIAGNOSIS — G4733 Obstructive sleep apnea (adult) (pediatric): Secondary | ICD-10-CM | POA: Diagnosis not present

## 2022-12-09 DIAGNOSIS — H524 Presbyopia: Secondary | ICD-10-CM | POA: Diagnosis not present

## 2022-12-11 DIAGNOSIS — G4733 Obstructive sleep apnea (adult) (pediatric): Secondary | ICD-10-CM | POA: Diagnosis not present

## 2022-12-16 ENCOUNTER — Other Ambulatory Visit: Payer: Self-pay

## 2022-12-16 NOTE — Progress Notes (Signed)
[facility] [address] [place] [address] Phone: [phone] Subjective:   Bruce Donath, am serving as a scribe for Dr. Antoine Primas.  I'm seeing this patient by the request  of:  Daisy Floro, MD  CC: Neck pain, cervicogenic headache follow-up  [id number]  [name] is a 57 y.o. female coming in with complaint of back and neck pain. OMT 10/22/2022. Patient states that she has been doing well since last visit.   Medications patient has been prescribed: None  Taking:         Reviewed prior external information including notes and imaging from previsou exam, outside providers and external EMR if available.   As well as notes that were available from care everywhere and other healthcare systems.  Past medical history, social, surgical and family history all reviewed in electronic medical record.  No pertanent information unless stated regarding to the chief complaint.   Past Medical History:  Diagnosis Date   History of ectopic pregnancy    04/ 2007  S/P LEFT SALPINGECTOMY   History of kidney stones    Hypothyroidism    Menorrhagia    Migraine     No Known Allergies   Review of Systems:  No , visual changes, nausea, vomiting, diarrhea, constipation, dizziness, abdominal pain, skin rash, fevers, chills, night sweats, weight loss, swollen lymph nodes, body aches, joint swelling, chest pain, shortness of breath, mood changes. POSITIVE muscle aches very mild headaches  Objective  Blood pressure 110/82, pulse 81, height 5\' 7"  (1.702 m), weight 205 lb (93 kg), SpO2 98 %.   General: No apparent distress alert and oriented x3 mood and affect normal, dressed appropriately.  HEENT: Pupils equal, extraocular movements intact  Respiratory: Patient's speak in full sentences and does not appear short of breath  Cardiovascular: No lower extremity edema, non tender, no erythema  Neck exam does have significant tightness noted  left greater than right.  Does have some limitation in sidebending bilaterally.  Osteopathic findings  C3 flexed rotated and side bent right C7 flexed rotated and side bent left T3 extended rotated and side bent right inhaled rib T9 extended rotated and side bent left L3 flexed rotated and side bent right Sacrum right on right    Assessment and Plan:  Cervicogenic headache Cervicogenic headache still noted.  Has been making good progress though at the moment.  Still working out the obstructive sleep apnea and her CPAP.  Do think that once patient has more control over that to patient should do relatively well.  Discussed which activities to do and which ones to avoid.  Patient is to continue with the core strengthening.  We discussed posture and ergonomics to work today.  Will follow-up again in 6 to 8 weeks    Nonallopathic problems  Decision today to treat with OMT was based on Physical Exam  After verbal consent patient was treated with HVLA, ME, FPR techniques in cervical, rib, thoracic, lumbar, and sacral  areas  Patient tolerated the procedure well with improvement in symptoms  Patient given exercises, stretches and lifestyle modifications  See medications in patient instructions if given  Patient will follow up in 4-8 weeks     The above documentation has been reviewed and is accurate and complete Judi Saa, DO         Note: This dictation was prepared with Dragon dictation along with smaller phrase technology. Any transcriptional errors that result from this process are  unintentional.

## 2022-12-17 ENCOUNTER — Ambulatory Visit: Payer: Commercial Managed Care - PPO | Admitting: Family Medicine

## 2022-12-17 VITALS — BP 110/82 | HR 81 | Ht 67.0 in | Wt 205.0 lb

## 2022-12-17 DIAGNOSIS — M9903 Segmental and somatic dysfunction of lumbar region: Secondary | ICD-10-CM

## 2022-12-17 DIAGNOSIS — G4486 Cervicogenic headache: Secondary | ICD-10-CM | POA: Diagnosis not present

## 2022-12-17 DIAGNOSIS — M9904 Segmental and somatic dysfunction of sacral region: Secondary | ICD-10-CM

## 2022-12-17 DIAGNOSIS — M9908 Segmental and somatic dysfunction of rib cage: Secondary | ICD-10-CM

## 2022-12-17 DIAGNOSIS — M9902 Segmental and somatic dysfunction of thoracic region: Secondary | ICD-10-CM | POA: Diagnosis not present

## 2022-12-17 DIAGNOSIS — M9901 Segmental and somatic dysfunction of cervical region: Secondary | ICD-10-CM | POA: Diagnosis not present

## 2022-12-17 NOTE — Patient Instructions (Signed)
Good to see you Good luck with CPAP I'll pass that along See me in 2-3 months

## 2022-12-17 NOTE — Assessment & Plan Note (Signed)
Cervicogenic headache still noted.  Has been making good progress though at the moment.  Still working out the obstructive sleep apnea and her CPAP.  Do think that once patient has more control over that to patient should do relatively well.  Discussed which activities to do and which ones to avoid.  Patient is to continue with the core strengthening.  We discussed posture and ergonomics to work today.  Will follow-up again in 6 to 8 weeks

## 2022-12-18 ENCOUNTER — Other Ambulatory Visit (HOSPITAL_COMMUNITY): Payer: Self-pay

## 2022-12-18 ENCOUNTER — Other Ambulatory Visit: Payer: Self-pay

## 2022-12-23 DIAGNOSIS — H04123 Dry eye syndrome of bilateral lacrimal glands: Secondary | ICD-10-CM | POA: Diagnosis not present

## 2022-12-23 DIAGNOSIS — H16201 Unspecified keratoconjunctivitis, right eye: Secondary | ICD-10-CM | POA: Diagnosis not present

## 2022-12-25 DIAGNOSIS — G4733 Obstructive sleep apnea (adult) (pediatric): Secondary | ICD-10-CM | POA: Diagnosis not present

## 2023-01-15 ENCOUNTER — Other Ambulatory Visit: Payer: Self-pay

## 2023-01-17 ENCOUNTER — Other Ambulatory Visit (HOSPITAL_COMMUNITY): Payer: Self-pay

## 2023-01-25 DIAGNOSIS — G4733 Obstructive sleep apnea (adult) (pediatric): Secondary | ICD-10-CM | POA: Diagnosis not present

## 2023-02-03 ENCOUNTER — Other Ambulatory Visit (HOSPITAL_COMMUNITY): Payer: Self-pay

## 2023-02-03 DIAGNOSIS — M1811 Unilateral primary osteoarthritis of first carpometacarpal joint, right hand: Secondary | ICD-10-CM | POA: Diagnosis not present

## 2023-02-03 MED ORDER — MELOXICAM 15 MG PO TABS
15.0000 mg | ORAL_TABLET | Freq: Every day | ORAL | 1 refills | Status: DC
  Filled 2023-02-03: qty 30, 30d supply, fill #0
  Filled 2023-03-19 – 2023-03-20 (×2): qty 30, 30d supply, fill #1

## 2023-02-04 ENCOUNTER — Ambulatory Visit: Payer: Commercial Managed Care - PPO | Admitting: Adult Health

## 2023-02-04 ENCOUNTER — Encounter: Payer: Self-pay | Admitting: Adult Health

## 2023-02-04 VITALS — BP 134/86 | HR 86 | Ht 67.0 in | Wt 204.8 lb

## 2023-02-04 DIAGNOSIS — G4733 Obstructive sleep apnea (adult) (pediatric): Secondary | ICD-10-CM

## 2023-02-04 NOTE — Assessment & Plan Note (Addendum)
Excellent control and compliance on CPAP . Will adjust pressure for comfort.  Change pressure to 5 to 11cmH2O   Plan  Patient Instructions  Continue on CPAP At bedtime, wear all night long for at least 6hr or more Change CPAP auto 5 to 11 cmH2O.  Work on healthy sleep regimen  Saline nasal spray Twice daily   Saline nasal gel At bedtime  (AYR)  Do not drive is sleepy.  Work on healthy weight  Follow up in 6 months and As needed

## 2023-02-04 NOTE — Progress Notes (Signed)
@Patient  ID: [name], female    DOB: 1966-01-12, 57 y.o.   MRN: [id number]  Chief Complaint  Patient presents with   Follow-up    Referring provider: Daisy Floro, MD  HPI: 57 year old female seen for sleep consult February 2024 for daytime sleepiness found to have moderate obstructive sleep apnea  TEST/EVENTS :  Home sleep study October 12, 2022 showed moderate sleep apnea with AHI at 20/hour and SpO2 low at 78%.   02/04/2023 Follow up ; OSA  Patient returns for a 96-month follow-up.  Patient patient was found to have moderate obstructive sleep apnea.  Last visit she was started on CPAP.  Patient says she is using her CPAP each night.  Does feel like it is starting to help her, still has some daytime sleepiness.  Feels that she benefits from CPAP.  She is using a DreamWear nasal mask.  He gets in about 7 to 8 hours of CPAP.  She really projections at 7.5 hours.  Patient is on auto CPAP 5 to 15 cm H2O.  Daily average pressure 10.9 cm H2O.  AHI 0.4/hour.  Minimum leaks. Feels pressure is too high at times.   No Known Allergies  Immunization History  Administered Date(s) Administered   Influenza Split 06/15/2009   Influenza,inj,Quad PF,6+ Mos 05/09/2016   Influenza-Unspecified 05/17/2022   PFIZER(Purple Top)SARS-COV-2 Vaccination 08/29/2019, 09/16/2019   Pfizer Covid-19 Vaccine Bivalent Booster 32yrs & up 06/08/2020   Tdap 03/08/2010, 02/06/2022, 03/03/2022   Zoster, Live 01/27/2020, 05/11/2020    Past Medical History:  Diagnosis Date   History of ectopic pregnancy    04/ 2007  S/P LEFT SALPINGECTOMY   History of kidney stones    Hypothyroidism    Menorrhagia    Migraine     Tobacco History: Social History   Tobacco Use  Smoking Status Never  Smokeless Tobacco Never   Counseling given: Not Answered   Outpatient Medications Prior to Visit  Medication Sig Dispense Refill   Erenumab-aooe (AIMOVIG) 140 MG/ML SOAJ Inject 140 mg under the skin every 28 days as  directed. 1 mL 11   Ibuprofen 200 MG CAPS Take by mouth daily as needed.     irbesartan (AVAPRO) 150 MG tablet Take 1 tablet (150 mg total) by mouth daily. 30 tablet 6   levothyroxine (SYNTHROID) 88 MCG tablet Take 1 tablet by mouth once daily in the morning on an empty stomach 90 tablet 4   LORazepam (ATIVAN) 1 MG tablet Take 1/2 - 1 tablet by mouth every 8 hours as needed for anxiety. 60 tablet 5   meloxicam (MOBIC) 15 MG tablet Take 1 tablet (15 mg total) by mouth once daily with a meal 30 tablet 1   Multiple Vitamin (MULTIVITAMIN WITH MINERALS) TABS tablet Take 1 tablet by mouth daily.     sertraline (ZOLOFT) 100 MG tablet Take 1 tablet (100 mg total) by mouth daily. 90 tablet 1   tiZANidine (ZANAFLEX) 2 MG tablet Take 1 tablet by mouth at bedtime. (Patient taking differently: Take 2 mg by mouth at bedtime as needed.) 30 tablet 0   Ubrogepant (UBRELVY) 100 MG TABS Take 1 tablet as needed.  May repeat in 2 hours.  Maximum 2 tablets in 24 hours. 16 tablet 11   No facility-administered medications prior to visit.     Review of Systems:   Constitutional:   No  weight loss, night sweats,  Fevers, chills, fatigue, or  lassitude.  HEENT:   No headaches,  Difficulty swallowing,  Tooth/dental problems, or  Sore throat,                No sneezing, itching, ear ache, nasal congestion, post nasal drip,   CV:  No chest pain,  Orthopnea, PND, swelling in lower extremities, anasarca, dizziness, palpitations, syncope.   GI  No heartburn, indigestion, abdominal pain, nausea, vomiting, diarrhea, change in bowel habits, loss of appetite, bloody stools.   Resp: No shortness of breath with exertion or at rest.  No excess mucus, no productive cough,  No non-productive cough,  No coughing up of blood.  No change in color of mucus.  No wheezing.  No chest wall deformity  Skin: no rash or lesions.  GU: no dysuria, change in color of urine, no urgency or frequency.  No flank pain, no hematuria   MS:  No  joint pain or swelling.  No decreased range of motion.  No back pain.    Physical Exam  BP 134/86 (BP Location: Left Arm, Patient Position: Sitting, Cuff Size: Large)   Pulse 86   Ht 5\' 7"  (1.702 m)   Wt 204 lb 12.8 oz (92.9 kg)   LMP  (LMP Unknown) Comment: been over 1 year  SpO2 94%   BMI 32.08 kg/m   GEN: A/Ox3; pleasant , NAD, well nourished    HEENT:  /AT,   NOSE-clear, THROAT-clear, no lesions, no postnasal drip or exudate noted.   NECK:  Supple w/ fair ROM; no JVD; normal carotid impulses w/o bruits; no thyromegaly or nodules palpated; no lymphadenopathy.    RESP  Clear  P & A; w/o, wheezes/ rales/ or rhonchi. no accessory muscle use, no dullness to percussion  CARD:  RRR, no m/r/g, no peripheral edema, pulses intact, no cyanosis or clubbing.  GI:   Soft & nt; nml bowel sounds; no organomegaly or masses detected.   Musco: Warm bil, no deformities or joint swelling noted.   Neuro: alert, no focal deficits noted.    Skin: Warm, no lesions or rashes    Lab Results:  CBC    Component Value Date/Time   WBC 7.0 10/09/2020 1338   RBC 4.62 10/09/2020 1338   HGB 14.3 10/09/2020 1338   HCT 42.3 10/09/2020 1338   PLT 288.0 10/09/2020 1338   MCV 91.5 10/09/2020 1338   MCH 30.6 10/16/2016 0817   MCHC 33.7 10/09/2020 1338   RDW 13.8 10/09/2020 1338   LYMPHSABS 1.8 10/09/2020 1338   MONOABS 0.5 10/09/2020 1338   EOSABS 0.1 10/09/2020 1338   BASOSABS 0.0 10/09/2020 1338    BMET    Component Value Date/Time   NA 138 10/09/2020 1338   K 4.0 10/09/2020 1338   CL 101 10/09/2020 1338   CO2 31 10/09/2020 1338   GLUCOSE 84 10/09/2020 1338   BUN 14 10/09/2020 1338   CREATININE 0.74 10/09/2020 1338   CALCIUM 9.8 10/09/2020 1338   GFRNONAA 75 (L) 10/28/2013 0710   GFRAA 87 (L) 10/28/2013 0710    BNP No results found for: "BNP"  ProBNP No results found for: "PROBNP"  Imaging: No results found.        No data to display          No results found  for: "NITRICOXIDE"      Assessment & Plan:   OSA (obstructive sleep apnea) Excellent control and compliance on CPAP . Will adjust pressure for comfort   Plan  Patient Instructions  Continue on CPAP At bedtime, wear all night long for at  least 6hr or more Change CPAP auto 5 to 11 cmH2O.  Work on healthy sleep regimen  Saline nasal spray Twice daily   Saline nasal gel At bedtime  (AYR)  Do not drive is sleepy.  Work on healthy weight  Follow up in 6 months and As needed        Marathon [employer], NP 02/04/2023

## 2023-02-04 NOTE — Addendum Note (Signed)
Addended by: Delrae Rend on: 02/04/2023 09:22 AM   Modules accepted: Orders

## 2023-02-04 NOTE — Patient Instructions (Addendum)
Continue on CPAP At bedtime, wear all night long for at least 6hr or more Change CPAP auto 5 to 11 cmH2O.  Work on healthy sleep regimen  Saline nasal spray Twice daily   Saline nasal gel At bedtime  (AYR)  Do not drive is sleepy.  Work on healthy weight  Follow up in 6 months and As needed

## 2023-02-09 NOTE — Progress Notes (Unsigned)
[facility] [address] [place] [address] Phone: [phone] Subjective:   [name], am serving as a scribe for Dr. Antoine Primas.  I'm seeing this patient by the request  of:  Daisy Floro, MD  CC: Neck and back pain follow-up  OZH:[id number]  [name] is a 57 y.o. female coming in with complaint of back and neck pain. OMT 12/17/2022. Patient states doing well. Headache for last couple of days.  Feels it is secondary to more meloxicam.  Was given this as well as an injection in the thumb area.  Feels like the right causing exacerbation of the neck pain and headaches.  Medications patient has been prescribed: None  Taking:         Reviewed prior external information including notes and imaging from previsou exam, outside providers and external EMR if available.   As well as notes that were available from care everywhere and other healthcare systems.  Past medical history, social, surgical and family history all reviewed in electronic medical record.  No pertanent information unless stated regarding to the chief complaint.   Past Medical History:  Diagnosis Date   History of ectopic pregnancy    04/ 2007  S/P LEFT SALPINGECTOMY   History of kidney stones    Hypothyroidism    Menorrhagia    Migraine     No Known Allergies   Review of Systems:  No  visual changes, nausea, vomiting, diarrhea, constipation, dizziness, abdominal pain, skin rash, fevers, chills, night sweats, weight loss, swollen lymph nodes, body aches, joint swelling, chest pain, shortness of breath, mood changes. POSITIVE muscle aches, headache  Objective  Blood pressure 122/76, pulse 92, height 5\' 7"  (1.702 m), weight 206 lb (93.4 kg), SpO2 98 %.   General: No apparent distress alert and oriented x3 mood and affect normal, dressed appropriately.  HEENT: Pupils equal, extraocular movements intact  Respiratory: Patient's speak in full sentences and  does not appear short of breath  Cardiovascular: No lower extremity edema, non tender, no erythema  Neck exam does have some mild loss of lordosis noted.  Patient does have some limited sidebending  Osteopathic findings  C2 flexed rotated and side bent right C6 flexed rotated and side bent left T3 extended rotated and side bent right inhaled rib T8 extended rotated and side bent left L2 flexed rotated and side bent right Sacrum right on right       Assessment and Plan:  Cervicogenic headache Chronic problem with worsening symptoms.  Discussed icing regimen and home exercises.  Discussed posture and ergonomics.  Patient will be doing a lot more outside and encouraged her to continue to stay hydrated.  Follow-up with me again in 2 months otherwise.    Nonallopathic problems  Decision today to treat with OMT was based on Physical Exam  After verbal consent patient was treated with HVLA, ME, FPR techniques in cervical, rib, thoracic, lumbar, and sacral  areas  Patient tolerated the procedure well with improvement in symptoms  Patient given exercises, stretches and lifestyle modifications  See medications in patient instructions if given  Patient will follow up in 4-8 weeks     The above documentation has been reviewed and is accurate and complete Judi Saa, DO         Note: This dictation was prepared with Dragon dictation along with smaller phrase technology. Any transcriptional errors that result from this process are unintentional.

## 2023-02-10 DIAGNOSIS — G4733 Obstructive sleep apnea (adult) (pediatric): Secondary | ICD-10-CM | POA: Diagnosis not present

## 2023-02-11 ENCOUNTER — Encounter: Payer: Self-pay | Admitting: Family Medicine

## 2023-02-11 ENCOUNTER — Ambulatory Visit: Payer: Commercial Managed Care - PPO | Admitting: Family Medicine

## 2023-02-11 VITALS — BP 122/76 | HR 92 | Ht 67.0 in | Wt 206.0 lb

## 2023-02-11 DIAGNOSIS — M9902 Segmental and somatic dysfunction of thoracic region: Secondary | ICD-10-CM

## 2023-02-11 DIAGNOSIS — M9901 Segmental and somatic dysfunction of cervical region: Secondary | ICD-10-CM | POA: Diagnosis not present

## 2023-02-11 DIAGNOSIS — M9908 Segmental and somatic dysfunction of rib cage: Secondary | ICD-10-CM | POA: Diagnosis not present

## 2023-02-11 DIAGNOSIS — G4486 Cervicogenic headache: Secondary | ICD-10-CM

## 2023-02-11 DIAGNOSIS — Z13220 Encounter for screening for lipoid disorders: Secondary | ICD-10-CM | POA: Diagnosis not present

## 2023-02-11 DIAGNOSIS — M9904 Segmental and somatic dysfunction of sacral region: Secondary | ICD-10-CM | POA: Diagnosis not present

## 2023-02-11 DIAGNOSIS — M9903 Segmental and somatic dysfunction of lumbar region: Secondary | ICD-10-CM | POA: Diagnosis not present

## 2023-02-11 DIAGNOSIS — Z0000 Encounter for general adult medical examination without abnormal findings: Secondary | ICD-10-CM | POA: Diagnosis not present

## 2023-02-11 DIAGNOSIS — Z136 Encounter for screening for cardiovascular disorders: Secondary | ICD-10-CM | POA: Diagnosis not present

## 2023-02-11 NOTE — Assessment & Plan Note (Signed)
Chronic problem with worsening symptoms.  Discussed icing regimen and home exercises.  Discussed posture and ergonomics.  Patient will be doing a lot more outside and encouraged her to continue to stay hydrated.  Follow-up with me again in 2 months otherwise.

## 2023-02-11 NOTE — Patient Instructions (Signed)
Good to see you! Happy 4th [employer] See you again in 7-8 weeks

## 2023-02-16 ENCOUNTER — Other Ambulatory Visit (HOSPITAL_COMMUNITY): Payer: Self-pay

## 2023-02-16 ENCOUNTER — Other Ambulatory Visit: Payer: Self-pay | Admitting: Physician Assistant

## 2023-02-16 DIAGNOSIS — R635 Abnormal weight gain: Secondary | ICD-10-CM | POA: Diagnosis not present

## 2023-02-16 DIAGNOSIS — Z0000 Encounter for general adult medical examination without abnormal findings: Secondary | ICD-10-CM | POA: Diagnosis not present

## 2023-02-16 DIAGNOSIS — E039 Hypothyroidism, unspecified: Secondary | ICD-10-CM | POA: Diagnosis not present

## 2023-02-16 DIAGNOSIS — Z6833 Body mass index (BMI) 33.0-33.9, adult: Secondary | ICD-10-CM | POA: Diagnosis not present

## 2023-02-16 DIAGNOSIS — E7800 Pure hypercholesterolemia, unspecified: Secondary | ICD-10-CM | POA: Diagnosis not present

## 2023-02-16 DIAGNOSIS — I10 Essential (primary) hypertension: Secondary | ICD-10-CM | POA: Diagnosis not present

## 2023-02-16 DIAGNOSIS — Z820 Family history of epilepsy and other diseases of the nervous system: Secondary | ICD-10-CM | POA: Diagnosis not present

## 2023-02-16 MED ORDER — LORAZEPAM 1 MG PO TABS
0.5000 mg | ORAL_TABLET | Freq: Three times a day (TID) | ORAL | 5 refills | Status: DC | PRN
  Filled 2023-02-16: qty 60, 20d supply, fill #0
  Filled 2023-03-19 – 2023-03-20 (×2): qty 60, 20d supply, fill #1
  Filled 2023-04-17: qty 60, 20d supply, fill #2
  Filled 2023-05-15: qty 60, 20d supply, fill #3
  Filled 2023-06-12: qty 60, 20d supply, fill #4
  Filled 2023-07-16: qty 60, 20d supply, fill #5

## 2023-02-18 ENCOUNTER — Other Ambulatory Visit (HOSPITAL_COMMUNITY): Payer: Self-pay

## 2023-02-18 ENCOUNTER — Encounter (INDEPENDENT_AMBULATORY_CARE_PROVIDER_SITE_OTHER): Payer: Commercial Managed Care - PPO | Admitting: Family Medicine

## 2023-02-18 MED ORDER — IRBESARTAN 150 MG PO TABS
150.0000 mg | ORAL_TABLET | Freq: Every day | ORAL | 1 refills | Status: DC
  Filled 2023-02-18 – 2023-03-02 (×2): qty 90, 90d supply, fill #0
  Filled 2023-05-30: qty 90, 90d supply, fill #1

## 2023-02-18 MED ORDER — LEVOTHYROXINE SODIUM 100 MCG PO TABS
100.0000 ug | ORAL_TABLET | Freq: Every morning | ORAL | 4 refills | Status: DC
  Filled 2023-02-18: qty 90, 90d supply, fill #0
  Filled 2023-05-08: qty 90, 90d supply, fill #1
  Filled 2023-08-13: qty 90, 90d supply, fill #2
  Filled 2023-11-11: qty 90, 90d supply, fill #3
  Filled 2024-02-03: qty 90, 90d supply, fill #4

## 2023-02-20 ENCOUNTER — Other Ambulatory Visit: Payer: Self-pay | Admitting: Physician Assistant

## 2023-02-20 ENCOUNTER — Other Ambulatory Visit (HOSPITAL_COMMUNITY): Payer: Self-pay

## 2023-02-20 MED ORDER — SERTRALINE HCL 100 MG PO TABS
100.0000 mg | ORAL_TABLET | Freq: Every day | ORAL | 1 refills | Status: DC
  Filled 2023-02-20: qty 90, 90d supply, fill #0
  Filled 2023-05-30: qty 90, 90d supply, fill #1

## 2023-02-24 DIAGNOSIS — G4733 Obstructive sleep apnea (adult) (pediatric): Secondary | ICD-10-CM | POA: Diagnosis not present

## 2023-03-02 ENCOUNTER — Other Ambulatory Visit (HOSPITAL_COMMUNITY): Payer: Self-pay

## 2023-03-04 ENCOUNTER — Other Ambulatory Visit (HOSPITAL_COMMUNITY): Payer: Self-pay

## 2023-03-04 ENCOUNTER — Other Ambulatory Visit: Payer: Self-pay

## 2023-03-05 ENCOUNTER — Other Ambulatory Visit (HOSPITAL_COMMUNITY): Payer: Self-pay

## 2023-03-06 ENCOUNTER — Other Ambulatory Visit: Payer: Self-pay

## 2023-03-20 ENCOUNTER — Other Ambulatory Visit (HOSPITAL_COMMUNITY): Payer: Self-pay

## 2023-03-26 NOTE — Progress Notes (Signed)
  [name] [address] [place] [address] Phone: [phone] Subjective:   [name], am serving as a scribe for Dr. Antoine Primas.  I'm seeing this patient by the request  of:  Daisy Floro, MD  CC: back, neck pain and headaches   OAC:[id number]  [name] is a 57 y.o. female coming in with complaint of back and neck pain. OMT 02/11/2023. Patient states doing well. Same per usual. No new concerns.  Medications patient has been prescribed: Zanaflex  Taking:         Reviewed prior external information including notes and imaging from previsou exam, outside providers and external EMR if available.   As well as notes that were available from care everywhere and other healthcare systems.  Past medical history, social, surgical and family history all reviewed in electronic medical record.  No pertanent information unless stated regarding to the chief complaint.   Past Medical History:  Diagnosis Date   History of ectopic pregnancy    04/ 2007  S/P LEFT SALPINGECTOMY   History of kidney stones    Hypothyroidism    Menorrhagia    Migraine     No Known Allergies   Review of Systems:  No headache, visual changes, nausea, vomiting, diarrhea, constipation, dizziness, abdominal pain, skin rash, fevers, chills, night sweats, weight loss, swollen lymph nodes, body aches, joint swelling, chest pain, shortness of breath, mood changes. POSITIVE muscle aches  Objective  Blood pressure 118/86, pulse 63, height 5\' 7"  (1.702 m), weight 209 lb (94.8 kg), SpO2 97%.   General: No apparent distress alert and oriented x3 mood and affect normal, dressed appropriately.  HEENT: Pupils equal, extraocular movements intact  Respiratory: Patient's speak in full sentences and does not appear short of breath  Cardiovascular: No lower extremity edema, non tender, no erythema  Neck exam has had some loss of lordosis noted.  Some tenderness to  palpation in the paraspinal musculature.  Some to be right greater than left.  What ever you   Osteopathic findings  C2 flexed rotated and side bent right C6 flexed rotated and side bent left T3 extended rotated and side bent right inhaled rib T9 extended rotated and side bent left L2 flexed rotated and side bent right Sacrum right on right    Assessment and Plan:  Cervicogenic headache Likely no headaches.  Discussed icing regimen and home exercises.  Discussed with patient that I do think that the activity was good for her at this time.  Discussed which activities could be potentially helpful and which ones to avoid.  Increase activity slowly otherwise.  Follow-up again in 6 to 8 weeks    Nonallopathic problems  Decision today to treat with OMT was based on Physical Exam  After verbal consent patient was treated with HVLA, ME, FPR techniques in cervical, rib, thoracic, lumbar, and sacral  areas  Patient tolerated the procedure well with improvement in symptoms  Patient given exercises, stretches and lifestyle modifications  See medications in patient instructions if given  Patient will follow up in 4-8 weeks     The above documentation has been reviewed and is accurate and complete Mallory Saa, DO         Note: This dictation was prepared with Dragon dictation along with smaller phrase technology. Any transcriptional errors that result from this process are unintentional.

## 2023-04-01 ENCOUNTER — Other Ambulatory Visit (HOSPITAL_COMMUNITY): Payer: Self-pay

## 2023-04-01 ENCOUNTER — Telehealth: Payer: Commercial Managed Care - PPO | Admitting: Nurse Practitioner

## 2023-04-01 ENCOUNTER — Encounter: Payer: Self-pay | Admitting: Family Medicine

## 2023-04-01 ENCOUNTER — Ambulatory Visit: Payer: Commercial Managed Care - PPO | Admitting: Family Medicine

## 2023-04-01 VITALS — BP 118/86 | HR 63 | Ht 67.0 in | Wt 209.0 lb

## 2023-04-01 DIAGNOSIS — M9903 Segmental and somatic dysfunction of lumbar region: Secondary | ICD-10-CM

## 2023-04-01 DIAGNOSIS — M9904 Segmental and somatic dysfunction of sacral region: Secondary | ICD-10-CM | POA: Diagnosis not present

## 2023-04-01 DIAGNOSIS — G4486 Cervicogenic headache: Secondary | ICD-10-CM | POA: Diagnosis not present

## 2023-04-01 DIAGNOSIS — M9902 Segmental and somatic dysfunction of thoracic region: Secondary | ICD-10-CM | POA: Diagnosis not present

## 2023-04-01 DIAGNOSIS — M9901 Segmental and somatic dysfunction of cervical region: Secondary | ICD-10-CM | POA: Diagnosis not present

## 2023-04-01 DIAGNOSIS — R3989 Other symptoms and signs involving the genitourinary system: Secondary | ICD-10-CM | POA: Diagnosis not present

## 2023-04-01 DIAGNOSIS — M9908 Segmental and somatic dysfunction of rib cage: Secondary | ICD-10-CM | POA: Diagnosis not present

## 2023-04-01 MED ORDER — PHENAZOPYRIDINE HCL 100 MG PO TABS
100.0000 mg | ORAL_TABLET | Freq: Three times a day (TID) | ORAL | 0 refills | Status: DC | PRN
Start: 2023-04-01 — End: 2023-04-16
  Filled 2023-04-01: qty 10, 4d supply, fill #0

## 2023-04-01 MED ORDER — CEPHALEXIN 500 MG PO CAPS
500.0000 mg | ORAL_CAPSULE | Freq: Two times a day (BID) | ORAL | 0 refills | Status: AC
Start: 2023-04-01 — End: 2023-04-08
  Filled 2023-04-01: qty 14, 7d supply, fill #0

## 2023-04-01 NOTE — Addendum Note (Signed)
Addended by: Margaretann Loveless on: 04/01/2023 09:45 AM   Modules accepted: Orders

## 2023-04-01 NOTE — Progress Notes (Signed)
E-Visit for Urinary Problems  We are sorry that you are not feeling well.  Here is how we plan to help!  Based on what you shared with me it looks like you most likely have a simple urinary tract infection.  A UTI (Urinary Tract Infection) is a bacterial infection of the bladder.  Most cases of urinary tract infections are simple to treat but a key part of your care is to encourage you to drink plenty of fluids and watch your symptoms carefully.  I have prescribed Keflex 500 mg twice a day for 7 days.  Your symptoms should gradually improve. Call us if the burning in your urine worsens, you develop worsening fever, back pain or pelvic pain or if your symptoms do not resolve after completing the antibiotic.  Urinary tract infections can be prevented by drinking plenty of water to keep your body hydrated.  Also be sure when you wipe, wipe from front to back and don't hold it in!  If possible, empty your bladder every 4 hours.  HOME CARE Drink plenty of fluids Compete the full course of the antibiotics even if the symptoms resolve Remember, when you need to go.go. Holding in your urine can increase the likelihood of getting a UTI! GET HELP RIGHT AWAY IF: You cannot urinate You get a high fever Worsening back pain occurs You see blood in your urine You feel sick to your stomach or throw up You feel like you are going to pass out  MAKE SURE YOU  Understand these instructions. Will watch your condition. Will get help right away if you are not doing well or get worse.   Thank you for choosing an e-visit.  Your e-visit answers were reviewed by a board certified advanced clinical practitioner to complete your personal care plan. Depending upon the condition, your plan could have included both over the counter or prescription medications.  Please review your pharmacy choice. Make sure the pharmacy is open so you can pick up prescription now. If there is a problem, you may contact your  provider through MyChart messaging and have the prescription routed to another pharmacy.  Your safety is important to us. If you have drug allergies check your prescription carefully.   For the next 24 hours you can use MyChart to ask questions about today's visit, request a non-urgent call back, or ask for a work or school excuse. You will get an email in the next two days asking about your experience. I hope that your e-visit has been valuable and will speed your recovery.   Meds ordered this encounter  Medications   cephALEXin (KEFLEX) 500 MG capsule    Sig: Take 1 capsule (500 mg total) by mouth 2 (two) times daily for 7 days.    Dispense:  14 capsule    Refill:  0    I spent approximately 5 minutes reviewing the patient's history, current symptoms and coordinating their care today.   

## 2023-04-01 NOTE — Assessment & Plan Note (Signed)
Likely no headaches.  Discussed icing regimen and home exercises.  Discussed with patient that I do think that the activity was good for her at this time.  Discussed which activities could be potentially helpful and which ones to avoid.  Increase activity slowly otherwise.  Follow-up again in 6 to 8 weeks

## 2023-04-01 NOTE — Patient Instructions (Signed)
See you again in 8 weeks

## 2023-04-06 ENCOUNTER — Telehealth: Payer: Commercial Managed Care - PPO | Admitting: Nurse Practitioner

## 2023-04-06 DIAGNOSIS — B379 Candidiasis, unspecified: Secondary | ICD-10-CM

## 2023-04-06 MED ORDER — FLUCONAZOLE 150 MG PO TABS
150.0000 mg | ORAL_TABLET | Freq: Once | ORAL | 0 refills | Status: AC
Start: 2023-04-06 — End: 2023-04-08
  Filled 2023-04-06: qty 1, 1d supply, fill #0

## 2023-04-06 NOTE — Progress Notes (Signed)

## 2023-04-07 ENCOUNTER — Other Ambulatory Visit (HOSPITAL_COMMUNITY): Payer: Self-pay

## 2023-04-07 ENCOUNTER — Other Ambulatory Visit: Payer: Self-pay

## 2023-04-13 ENCOUNTER — Emergency Department (HOSPITAL_BASED_OUTPATIENT_CLINIC_OR_DEPARTMENT_OTHER)
Admission: EM | Admit: 2023-04-13 | Discharge: 2023-04-13 | Disposition: A | Discharge status: Home / Self Care | Payer: Commercial Managed Care - PPO | Attending: Emergency Medicine | Admitting: Emergency Medicine

## 2023-04-13 ENCOUNTER — Other Ambulatory Visit: Payer: Self-pay

## 2023-04-13 ENCOUNTER — Emergency Department (HOSPITAL_BASED_OUTPATIENT_CLINIC_OR_DEPARTMENT_OTHER): Payer: Commercial Managed Care - PPO | Admitting: Radiology

## 2023-04-13 ENCOUNTER — Encounter (HOSPITAL_BASED_OUTPATIENT_CLINIC_OR_DEPARTMENT_OTHER): Payer: Self-pay

## 2023-04-13 DIAGNOSIS — Y93K1 Activity, walking an animal: Secondary | ICD-10-CM | POA: Diagnosis not present

## 2023-04-13 DIAGNOSIS — X509XXA Other and unspecified overexertion or strenuous movements or postures, initial encounter: Secondary | ICD-10-CM | POA: Diagnosis not present

## 2023-04-13 DIAGNOSIS — S80212A Abrasion, left knee, initial encounter: Secondary | ICD-10-CM | POA: Insufficient documentation

## 2023-04-13 DIAGNOSIS — S42202A Unspecified fracture of upper end of left humerus, initial encounter for closed fracture: Secondary | ICD-10-CM | POA: Insufficient documentation

## 2023-04-13 DIAGNOSIS — M25512 Pain in left shoulder: Secondary | ICD-10-CM | POA: Diagnosis present

## 2023-04-13 DIAGNOSIS — S42292A Other displaced fracture of upper end of left humerus, initial encounter for closed fracture: Secondary | ICD-10-CM | POA: Diagnosis not present

## 2023-04-13 DIAGNOSIS — S42295A Other nondisplaced fracture of upper end of left humerus, initial encounter for closed fracture: Secondary | ICD-10-CM | POA: Diagnosis not present

## 2023-04-13 MED ORDER — OXYCODONE HCL 5 MG PO TABS
5.0000 mg | ORAL_TABLET | ORAL | 0 refills | Status: DC | PRN
  Filled 2023-04-13: qty 20, 4d supply, fill #0

## 2023-04-13 MED ORDER — OXYCODONE HCL 5 MG PO TABS
5.0000 mg | ORAL_TABLET | ORAL | Status: AC
  Administered 2023-04-13: 5 mg via ORAL
  Filled 2023-04-13: qty 1

## 2023-04-13 NOTE — ED Triage Notes (Signed)
Pt to ED c/o left shoulder injury. Reports was walking dog, dog pulled her down and landed on left shoulder. No LOC. Pt did not hit head.

## 2023-04-13 NOTE — Discharge Instructions (Addendum)
You were seen for your broken arm (proximal humerus fracture).  Please wear the immobilizer to prevent any additional pain.  You may remove it for showering and bathing.  Please follow-up with the orthopedic surgeons within the next week.  You may follow-up with Dr. Rennis Chris or Dr. Ranell Patrick.  Take Tylenol and ibuprofen for your pain. You may also take the oxycodone we have prescribed you for any breakthrough pain that may have.  Do not take this before driving or operating heavy machinery.  Do not take this medication with alcohol.  Return to the emergency department if you experience any of the following: Numbness or weakness of your arm or hand, severe pain, or any other concerning symptoms.

## 2023-04-13 NOTE — ED Provider Notes (Signed)
[place] EMERGENCY DEPARTMENT AT [facility] Provider Note   CSN: [id number] Arrival date & time: 04/13/23  2031     History {Add pertinent medical, surgical, social history, OB history to HPI:1} Chief Complaint  Patient presents with   Arm Injury    Left shoulder    [name] is a 57 y.o. female.  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 Authorizing Provider  Erenumab-aooe (AIMOVIG) 140 MG/ML SOAJ Inject 140 mg under the skin every 28 days as directed. 09/12/22   Drema Dallas, DO  Ibuprofen 200 MG CAPS Take by mouth daily as needed.    [provider]  irbesartan (AVAPRO) 150 MG tablet Take 1 tablet (150 mg total) by mouth daily. 10/08/22     irbesartan (AVAPRO) 150 MG tablet Take 1 tablet (150 mg total) by mouth daily. 02/18/23     levothyroxine (SYNTHROID) 100 MCG tablet Take 1 tablet (100 mcg total) by mouth every morning on empty stomach. 02/18/23     levothyroxine (SYNTHROID) 88 MCG tablet Take 1 tablet by mouth once daily in the morning on an empty stomach 02/06/22     LORazepam (ATIVAN) 1 MG tablet Take 1/2 - 1 tablet by mouth every 8 hours as needed for anxiety. 02/16/23   Cherie Ouch, PA-C  meloxicam (MOBIC) 15 MG tablet Take 1 tablet (15 mg total) by mouth once daily with a meal 02/03/23     Multiple Vitamin (MULTIVITAMIN WITH MINERALS) TABS tablet Take 1 tablet by mouth daily.    [provider]  phenazopyridine (PYRIDIUM) 100 MG tablet Take 1 tablet (100 mg total) by mouth 3 (three) times daily as needed for pain. 04/01/23   Margaretann Loveless, PA-C  sertraline (ZOLOFT) 100 MG tablet Take 1 tablet (100 mg total) by mouth daily. 02/20/23   Melony Overly T, PA-C  tiZANidine (ZANAFLEX) 2 MG tablet Take 1 tablet by mouth at bedtime. Patient taking differently: Take 2 mg by mouth at bedtime as needed. 02/27/22    Judi Saa, DO  Ubrogepant (UBRELVY) 100 MG TABS Take 1 tablet as needed.  May repeat in 2 hours.  Maximum 2 tablets in 24 hours. 09/12/22   Drema Dallas, DO      Allergies    Patient has no known allergies.    Review of Systems   Review of Systems  Physical Exam Updated Vital Signs BP (!) 153/97 (BP Location: Right Arm)   Pulse 88   Temp 98.4 F (36.9 C)   Resp 20   Ht 5\' 7"  (1.702 m)   Wt 94.8 kg   LMP  (LMP Unknown) Comment: been over 1 year  SpO2 93%   BMI 32.73 kg/m  Physical Exam  ED Results / Procedures / Treatments   Labs (all labs ordered are listed, but only abnormal results are displayed) Labs Reviewed - No data to display  EKG None  Radiology DG Shoulder Left  Result Date: 04/13/2023 CLINICAL DATA:  Left shoulder pain after injury. EXAM: LEFT SHOULDER - 2+ VIEW COMPARISON:  None Available. FINDINGS: Comminuted and displaced fracture of the proximal humeral neck and lateral humeral head. Dominant fracture is transverse through the surgical neck. Fracture displacement is less than 1 cm. No glenohumeral dislocation. The acromioclavicular joint is congruent. IMPRESSION: Comminuted and displaced proximal humeral neck and  lateral humeral head fracture. Electronically Signed   By: Narda Rutherford M.D.   On: 04/13/2023 21:35    Procedures Procedures  {Document cardiac monitor, telemetry assessment procedure when appropriate:1}  Medications Ordered in ED Medications - No data to display  ED Course/ Medical Decision Making/ A&P Clinical Course as of 04/13/23 2309  Mon Apr 13, 2023  2214 Dr Charlann Boxer from orthopedic surgery consulted.  Recommends immobilizer and follow-up with Dr. Rennis Chris or Dr. Ranell Patrick. [RP]    Clinical Course User Index [RP] Rondel Baton, MD   {   Click here for ABCD2, HEART and other calculatorsREFRESH Note before signing :1}                              Medical Decision Making Amount and/or Complexity of Data Reviewed Radiology:  ordered.  Risk Prescription drug management.   ***  {Document critical care time when appropriate:1} {Document review of labs and clinical decision tools ie heart score, Chads2Vasc2 etc:1}  {Document your independent review of radiology images, and any outside records:1} {Document your discussion with family members, caretakers, and with consultants:1} {Document social determinants of health affecting pt's care:1} {Document your decision making why or why not admission, treatments were needed:1} Final Clinical Impression(s) / ED Diagnoses Final diagnoses:  None    Rx / DC Orders ED Discharge Orders     None

## 2023-04-14 ENCOUNTER — Other Ambulatory Visit: Payer: Self-pay

## 2023-04-16 ENCOUNTER — Other Ambulatory Visit: Payer: Self-pay

## 2023-04-16 ENCOUNTER — Other Ambulatory Visit (HOSPITAL_COMMUNITY): Payer: Self-pay

## 2023-04-16 ENCOUNTER — Encounter (HOSPITAL_COMMUNITY): Payer: Self-pay | Admitting: Anesthesiology

## 2023-04-16 ENCOUNTER — Ambulatory Visit
Admission: RE | Admit: 2023-04-16 | Discharge: 2023-04-16 | Disposition: A | Discharge status: Home / Self Care | Payer: Commercial Managed Care - PPO | Source: Ambulatory Visit | Attending: Orthopedic Surgery | Admitting: Orthopedic Surgery

## 2023-04-16 ENCOUNTER — Other Ambulatory Visit: Payer: Self-pay | Admitting: Orthopedic Surgery

## 2023-04-16 ENCOUNTER — Encounter (HOSPITAL_COMMUNITY): Payer: Self-pay | Admitting: Orthopedic Surgery

## 2023-04-16 DIAGNOSIS — S42142A Displaced fracture of glenoid cavity of scapula, left shoulder, initial encounter for closed fracture: Secondary | ICD-10-CM | POA: Diagnosis not present

## 2023-04-16 DIAGNOSIS — S42252A Displaced fracture of greater tuberosity of left humerus, initial encounter for closed fracture: Secondary | ICD-10-CM | POA: Diagnosis not present

## 2023-04-16 DIAGNOSIS — S42212A Unspecified displaced fracture of surgical neck of left humerus, initial encounter for closed fracture: Secondary | ICD-10-CM

## 2023-04-16 DIAGNOSIS — S42352A Displaced comminuted fracture of shaft of humerus, left arm, initial encounter for closed fracture: Secondary | ICD-10-CM | POA: Diagnosis not present

## 2023-04-16 DIAGNOSIS — S42202A Unspecified fracture of upper end of left humerus, initial encounter for closed fracture: Secondary | ICD-10-CM

## 2023-04-16 NOTE — Progress Notes (Signed)
Attempted to obtain medical history via telephone, unable to reach at this time. HIPAA compliant voicemail message left requesting return call to pre surgical testing department. 

## 2023-04-16 NOTE — Progress Notes (Addendum)
For Anesthesia: PCP - Daisy Floro, MD  Pulmonologist - Rubye Oaks NP  Chest x-ray - N/A EKG - will get day of surgery Stress Test - N/A ECHO - N/A Cardiac Cath - N/A Pacemaker/ICD device last checked: N/A Pacemaker orders received: N/A Device Rep notified: N/A  Spinal Cord Stimulator: N/A  Sleep Study - 10/28/22 in [facility] CPAP - Yes  Fasting Blood Sugar - N/A Checks Blood Sugar __N/A___ times a day Date and result of last Hgb A1c- N/A  Last dose of GLP1 agonist- N/A GLP1 instructions: N/A  Last dose of SGLT-2 inhibitors- N/A SGLT-2 instructions:N/A  Blood Thinner Instructions:N/A Aspirin Instructions:N/A Last Dose:N/A  Activity level:  Able to exercise without chest pain and/or shortness of breath      Anesthesia review: N/A  Patient denies shortness of breath, fever, cough and chest pain during pre op phone call.   Patient verbalized understanding of instructions reviewed via telephone.

## 2023-04-16 NOTE — H&P (Signed)
Patient's anticipated LOS is less than 2 midnights, meeting these requirements: - Younger than 37 - Lives within 1 hour of care - Has a competent adult at home to recover with post-op recover - NO history of  - Chronic pain requiring opiods  - Diabetes  - Coronary Artery Disease  - Heart failure  - Heart attack  - Stroke  - DVT/VTE  - Cardiac arrhythmia  - Respiratory Failure/COPD  - Renal failure  - Anemia  - Advanced Liver disease     [name] is an 57 y.o. female.    Chief Complaint: left shoulder pain  HPI: Pt is a 57 y.o. female complaining of left shoulder pain after recent fall/injury. Pain had continually increased since the beginning. X-rays in the clinic show displaced proximal humerus fracture. Pt has tried various conservative treatments which have failed to alleviate their symptoms. Various options are discussed with the patient. Risks, benefits and expectations were discussed with the patient. Patient understand the risks, benefits and expectations and wishes to proceed with surgery.   PCP:  Daisy Floro, MD  D/C Plans: Home  PMH: Past Medical History:  Diagnosis Date   History of ectopic pregnancy    04/ 2007  S/P LEFT SALPINGECTOMY   History of kidney stones    Hypothyroidism    Menorrhagia    Migraine     PSH: Past Surgical History:  Procedure Laterality Date   CARPAL TUNNEL RELEASE Left 12/05/2017   Procedure: OPEN REDUCTION INTERNAL FIXATION (ORIF) WRIST FRACTURE WITH CARPAL TUNNEL RELEASE;  Surgeon: Dominica Severin, MD;  Location: [facility] OR;  Service: Orthopedics;  Laterality: Left;   DILATATION & CURETTAGE/HYSTEROSCOPY WITH MYOSURE  2016   DILATATION & CURETTAGE/HYSTEROSCOPY WITH MYOSURE N/A 10/23/2016   Procedure: DILATATION & CURETTAGE/HYSTEROSCOPY;  Surgeon: Harold Hedge, MD;  Location: [facility];  Service: Gynecology;  Laterality: N/A;   DILATION AND EVACUATION  10/07/2007   retained poc   LAPAROSCOPIC UNILATERAL  SALPINGECTOMY Left 11/24/2005    Social History:  reports that she has never smoked. She has never used smokeless tobacco. She reports that she does not drink alcohol and does not use drugs. BMI: Estimated body mass index is 32.73 kg/m as calculated from the following:   Height as of 04/13/23: 5\' 7"  (1.702 m).   Weight as of 04/13/23: 94.8 kg.  Lab Results  Component Value Date   ALBUMIN 4.4 10/09/2020   Diabetes: Patient does not have a diagnosis of diabetes.     Smoking Status:   reports that she has never smoked. She has never used smokeless tobacco.    Allergies:  No Known Allergies  Medications: No current facility-administered medications for this encounter.   Current Outpatient Medications  Medication Sig Dispense Refill   Erenumab-aooe (AIMOVIG) 140 MG/ML SOAJ Inject 140 mg under the skin every 28 days as directed. 1 mL 11   Ibuprofen 200 MG CAPS Take by mouth daily as needed.     irbesartan (AVAPRO) 150 MG tablet Take 1 tablet (150 mg total) by mouth daily. 30 tablet 6   irbesartan (AVAPRO) 150 MG tablet Take 1 tablet (150 mg total) by mouth daily. 90 tablet 1   levothyroxine (SYNTHROID) 100 MCG tablet Take 1 tablet (100 mcg total) by mouth every morning on empty stomach. 90 tablet 4   levothyroxine (SYNTHROID) 88 MCG tablet Take 1 tablet by mouth once daily in the morning on an empty stomach 90 tablet 4   LORazepam (ATIVAN) 1 MG tablet  Take 1/2 - 1 tablet by mouth every 8 hours as needed for anxiety. 60 tablet 5   meloxicam (MOBIC) 15 MG tablet Take 1 tablet (15 mg total) by mouth once daily with a meal 30 tablet 1   Multiple Vitamin (MULTIVITAMIN WITH MINERALS) TABS tablet Take 1 tablet by mouth daily.     oxyCODONE (ROXICODONE) 5 MG immediate release tablet Take 1 tablet (5 mg total) by mouth every 4 (four) hours as needed. 20 tablet 0   phenazopyridine (PYRIDIUM) 100 MG tablet Take 1 tablet (100 mg total) by mouth 3 (three) times daily as needed for pain. 10 tablet 0    sertraline (ZOLOFT) 100 MG tablet Take 1 tablet (100 mg total) by mouth daily. 90 tablet 1   tiZANidine (ZANAFLEX) 2 MG tablet Take 1 tablet by mouth at bedtime. (Patient taking differently: Take 2 mg by mouth at bedtime as needed.) 30 tablet 0   Ubrogepant (UBRELVY) 100 MG TABS Take 1 tablet as needed.  May repeat in 2 hours.  Maximum 2 tablets in 24 hours. 16 tablet 11    No results found for this or any previous visit (from the past 48 hour(s)). No results found.  ROS: Pain with rom of the left upper extremity  Physical Exam: Alert and oriented 57 y.o. female in no acute distress Cranial nerves 2-12 intact Cervical spine: full rom with no tenderness, nv intact distally Chest: active breath sounds bilaterally, no wheeze rhonchi or rales Heart: regular rate and rhythm, no murmur Abd: non tender non distended with active bowel sounds Hip is stable with rom  Left shoulder with limited rom due to fracture No signs of open injury Nv intact distally  Assessment/Plan Assessment: left proximal humerus fracture with displacement  Plan:  Patient will undergo a left proximal humerus ORIF by Dr. Ranell Patrick at [facility] Risks benefits and expectations were discussed with the patient. Patient understand risks, benefits and expectations and wishes to proceed. Preoperative templating of the joint replacement has been completed, documented, and submitted to the Operating Room personnel in order to optimize intra-operative equipment management.   Alphonsa Overall PA-C, MPAS [facility] is now [employer] [address], [place], [place] [address] Phone: [phone] [email or web address] Facebook  [employer]

## 2023-04-16 NOTE — Anesthesia Preprocedure Evaluation (Signed)
Anesthesia Evaluation    Reviewed: Allergy & Precautions, Patient's Chart, lab work & pertinent test results  History of Anesthesia Complications Negative for: history of anesthetic complications  Airway        Dental   Pulmonary sleep apnea           Cardiovascular hypertension, Pt. on medications      Neuro/Psych  Headaches    GI/Hepatic negative GI ROS, Neg liver ROS,,,  Endo/Other  Hypothyroidism    Renal/GU negative Renal ROS     Musculoskeletal LEFT PROXIMAL HUMERUS FRACTURE   Abdominal   Peds  Hematology negative hematology ROS (+)   Anesthesia Other Findings Day of surgery medications reviewed with patient.  Reproductive/Obstetrics                             Anesthesia Physical Anesthesia Plan  ASA: 2  Anesthesia Plan: General   Post-op Pain Management: Regional block* and Tylenol PO (pre-op)*   Induction: Intravenous  PONV Risk Score and Plan: 3 and Treatment may vary due to age or medical condition, Ondansetron, Dexamethasone and Midazolam  Airway Management Planned: Oral ETT  Additional Equipment: None  Intra-op Plan:   Post-operative Plan: Extubation in OR  Informed Consent:   Plan Discussed with:   Anesthesia Plan Comments:         Anesthesia Quick Evaluation

## 2023-04-17 ENCOUNTER — Ambulatory Visit (HOSPITAL_COMMUNITY)
Admission: RE | Admit: 2023-04-17 | Payer: Commercial Managed Care - PPO | Source: Home / Self Care | Admitting: Orthopedic Surgery

## 2023-04-17 ENCOUNTER — Other Ambulatory Visit: Payer: Self-pay

## 2023-04-17 DIAGNOSIS — G4733 Obstructive sleep apnea (adult) (pediatric): Secondary | ICD-10-CM

## 2023-04-17 DIAGNOSIS — I10 Essential (primary) hypertension: Secondary | ICD-10-CM

## 2023-04-17 HISTORY — DX: Essential (primary) hypertension: I10

## 2023-04-17 HISTORY — DX: Unspecified osteoarthritis, unspecified site: M19.90

## 2023-04-17 HISTORY — DX: Gastro-esophageal reflux disease without esophagitis: K21.9

## 2023-04-17 HISTORY — DX: Other specified postprocedural states: Z98.890

## 2023-04-17 HISTORY — DX: Other specified postprocedural states: R11.2

## 2023-04-17 SURGERY — OPEN REDUCTION INTERNAL FIXATION (ORIF) PROXIMAL HUMERUS FRACTURE
Anesthesia: Choice | Laterality: Left

## 2023-04-27 DIAGNOSIS — G4733 Obstructive sleep apnea (adult) (pediatric): Secondary | ICD-10-CM | POA: Diagnosis not present

## 2023-04-28 ENCOUNTER — Telehealth: Payer: Commercial Managed Care - PPO | Admitting: Physician Assistant

## 2023-04-28 ENCOUNTER — Other Ambulatory Visit (HOSPITAL_COMMUNITY): Payer: Self-pay

## 2023-04-28 DIAGNOSIS — J069 Acute upper respiratory infection, unspecified: Secondary | ICD-10-CM | POA: Diagnosis not present

## 2023-04-28 MED ORDER — FLUTICASONE PROPIONATE 50 MCG/ACT NA SUSP
2.0000 | Freq: Every day | NASAL | 0 refills | Status: DC
Start: 2023-04-28 — End: 2023-09-21
  Filled 2023-04-28: qty 16, 30d supply, fill #0

## 2023-04-28 MED ORDER — BENZONATATE 100 MG PO CAPS
100.0000 mg | ORAL_CAPSULE | Freq: Three times a day (TID) | ORAL | 0 refills | Status: DC | PRN
Start: 2023-04-28 — End: 2023-05-03
  Filled 2023-04-28: qty 30, 10d supply, fill #0

## 2023-04-28 NOTE — Progress Notes (Signed)
I have spent 5 minutes in review of e-visit questionnaire, review and updating patient chart, medical decision making and response to patient.   Mia Milan Cody Jacklynn Dehaas, PA-C    

## 2023-04-28 NOTE — Progress Notes (Signed)

## 2023-04-30 DIAGNOSIS — M79671 Pain in right foot: Secondary | ICD-10-CM | POA: Diagnosis not present

## 2023-04-30 DIAGNOSIS — S42212A Unspecified displaced fracture of surgical neck of left humerus, initial encounter for closed fracture: Secondary | ICD-10-CM | POA: Diagnosis not present

## 2023-05-03 ENCOUNTER — Telehealth: Payer: Commercial Managed Care - PPO | Admitting: Family

## 2023-05-03 DIAGNOSIS — J209 Acute bronchitis, unspecified: Secondary | ICD-10-CM

## 2023-05-03 MED ORDER — PREDNISONE 10 MG (21) PO TBPK
ORAL_TABLET | ORAL | 0 refills | Status: DC
Start: 2023-05-03 — End: 2023-09-21

## 2023-05-03 MED ORDER — BENZONATATE 100 MG PO CAPS
100.0000 mg | ORAL_CAPSULE | Freq: Three times a day (TID) | ORAL | 0 refills | Status: DC | PRN
Start: 2023-05-03 — End: 2023-09-21

## 2023-05-03 NOTE — Progress Notes (Signed)
E-Visit for Cough  We are sorry that you are not feeling well.  Here is how we plan to help!  Based on your presentation I believe you most likely have A cough due to a virus.  This is called viral bronchitis and is best treated by rest, plenty of fluids and control of the cough.  You may use Ibuprofen or Tylenol as directed to help your symptoms.     In addition you may use A non-prescription cough medication called Robitussin DAC. Take 2 teaspoons every 8 hours or Delsym: take 2 teaspoons every 12 hours., A non-prescription cough medication called Mucinex DM: take 2 tablets every 12 hours., and A prescription cough medication called Tessalon Perles 100mg. You may take 1-2 capsules every 8 hours as needed for your cough.  Prednisone 10 mg daily for 6 days (see taper instructions below)  Directions for 6 day taper: Day 1: 2 tablets before breakfast, 1 after both lunch & dinner and 2 at bedtime Day 2: 1 tab before breakfast, 1 after both lunch & dinner and 2 at bedtime Day 3: 1 tab at each meal & 1 at bedtime Day 4: 1 tab at breakfast, 1 at lunch, 1 at bedtime Day 5: 1 tab at breakfast & 1 tab at bedtime Day 6: 1 tab at breakfast  From your responses in the eVisit questionnaire you describe inflammation in the upper respiratory tract which is causing a significant cough.  This is commonly called Bronchitis and has four common causes:   Allergies Viral Infections Acid Reflux Bacterial Infection Allergies, viruses and acid reflux are treated by controlling symptoms or eliminating the cause. An example might be a cough caused by taking certain blood pressure medications. You stop the cough by changing the medication. Another example might be a cough caused by acid reflux. Controlling the reflux helps control the cough.  USE OF BRONCHODILATOR ("RESCUE") INHALERS: There is a risk from using your bronchodilator too frequently.  The risk is that over-reliance on a medication which only relaxes the  muscles surrounding the breathing tubes can reduce the effectiveness of medications prescribed to reduce swelling and congestion of the tubes themselves.  Although you feel brief relief from the bronchodilator inhaler, your asthma may actually be worsening with the tubes becoming more swollen and filled with mucus.  This can delay other crucial treatments, such as oral steroid medications. If you need to use a bronchodilator inhaler daily, several times per day, you should discuss this with your provider.  There are probably better treatments that could be used to keep your asthma under control.     HOME CARE Only take medications as instructed by your medical team. Complete the entire course of an antibiotic. Drink plenty of fluids and get plenty of rest. Avoid close contacts especially the very young and the elderly Cover your mouth if you cough or cough into your sleeve. Always remember to wash your hands A steam or ultrasonic humidifier can help congestion.   GET HELP RIGHT AWAY IF: You develop worsening fever. You become short of breath You cough up blood. Your symptoms persist after you have completed your treatment plan MAKE SURE YOU  Understand these instructions. Will watch your condition. Will get help right away if you are not doing well or get worse.    Thank you for choosing an e-visit.  Your e-visit answers were reviewed by a board certified advanced clinical practitioner to complete your personal care plan. Depending upon the condition, your plan   could have included both over the counter or prescription medications.  Please review your pharmacy choice. Make sure the pharmacy is open so you can pick up prescription now. If there is a problem, you may contact your provider through MyChart messaging and have the prescription routed to another pharmacy.  Your safety is important to us. If you have drug allergies check your prescription carefully.   For the next 24 hours you can  use MyChart to ask questions about today's visit, request a non-urgent call back, or ask for a work or school excuse. You will get an email in the next two days asking about your experience. I hope that your e-visit has been valuable and will speed your recovery.  Approximately 5 minutes was spent documenting and reviewing patient's chart.   

## 2023-05-04 ENCOUNTER — Other Ambulatory Visit (HOSPITAL_COMMUNITY): Payer: Self-pay

## 2023-05-08 ENCOUNTER — Other Ambulatory Visit (HOSPITAL_COMMUNITY): Payer: Self-pay

## 2023-05-09 ENCOUNTER — Other Ambulatory Visit (HOSPITAL_COMMUNITY): Payer: Self-pay

## 2023-05-11 ENCOUNTER — Telehealth: Payer: Self-pay | Admitting: Neurology

## 2023-05-11 NOTE — Telephone Encounter (Signed)
Pt called in stating she is supposed to pick up her Aimovig shot today and wants to see if the authorization is back? She wants it expedited. She is worried she won't be able to get it today.

## 2023-05-11 NOTE — Telephone Encounter (Signed)
Advised patient we haven't received a PA from the pharmacy not saying that did not send it but I have documented on all PA sent.   If the patient likes she can come by the office to pick up a sample until we can get that PA done.  Per patient she will see if someone can come by and get the sample.

## 2023-05-11 NOTE — Telephone Encounter (Signed)
LMOVM to call the office back.

## 2023-05-11 NOTE — Telephone Encounter (Signed)
Pt called in and left a message with the access nurse returning [facility] call

## 2023-05-12 ENCOUNTER — Other Ambulatory Visit (HOSPITAL_COMMUNITY): Payer: Self-pay

## 2023-05-12 DIAGNOSIS — S92354A Nondisplaced fracture of fifth metatarsal bone, right foot, initial encounter for closed fracture: Secondary | ICD-10-CM | POA: Diagnosis not present

## 2023-05-12 MED ORDER — MELOXICAM 7.5 MG PO TABS
ORAL_TABLET | ORAL | 0 refills | Status: DC
  Filled 2023-05-12: qty 60, 30d supply, fill #0

## 2023-05-12 MED ORDER — DICLOFENAC SODIUM 1 % EX GEL: CUTANEOUS | 2 refills | Status: DC

## 2023-05-13 ENCOUNTER — Encounter: Payer: Self-pay | Admitting: Neurology

## 2023-05-13 ENCOUNTER — Other Ambulatory Visit: Payer: Self-pay

## 2023-05-14 ENCOUNTER — Other Ambulatory Visit (HOSPITAL_COMMUNITY): Payer: Self-pay

## 2023-05-14 ENCOUNTER — Encounter: Payer: Self-pay | Admitting: Family Medicine

## 2023-05-15 ENCOUNTER — Telehealth: Payer: Self-pay

## 2023-05-15 ENCOUNTER — Other Ambulatory Visit (HOSPITAL_COMMUNITY): Payer: Self-pay

## 2023-05-15 NOTE — Telephone Encounter (Signed)
Medication Samples have been provided to the patient.  Drug name: Aimovig       Strength: 140 mg        Qty: 1  LOT: [id number]  Exp.Date: 7/312025  Dosing instructions: every 28  The patient has been instructed regarding the correct time, dose, and frequency of taking this medication, including desired effects and most common side effects.   Leida Lauth 9:57 AM 05/15/2023

## 2023-05-15 NOTE — Telephone Encounter (Signed)
Pt called in stating the [employer] people do not have a prior auth for her Aimovig. She is wondering where it is pending? She stated they told her it doesn't have to be done electronically, it can just be a phone call since it is a renewal.

## 2023-05-19 ENCOUNTER — Telehealth: Payer: Self-pay

## 2023-05-19 ENCOUNTER — Other Ambulatory Visit (HOSPITAL_COMMUNITY): Payer: Self-pay

## 2023-05-19 NOTE — Telephone Encounter (Signed)
Pharmacy Patient Advocate Encounter   Received notification from Pt Calls Messages that prior authorization for Aimovig 140MG /ML auto-injectors is required/requested.   Insurance verification completed.   The patient is insured through [facility] .   Per test claim: PA required; PA submitted to [employer] via CoverMyMeds Key/confirmation #/EOC [id number] Status is pending

## 2023-05-20 NOTE — Telephone Encounter (Signed)
Addressed in other encounter 

## 2023-05-20 NOTE — Telephone Encounter (Signed)
Pharmacy Patient Advocate Encounter  Received notification from [facility] that Prior Authorization for Aimovig 140MG /ML auto-injectors has been APPROVED from 05-19-2023 to 05-16-2024   PA #/Case ID/Reference #: [id number]

## 2023-05-20 NOTE — Telephone Encounter (Signed)
LMOVM approval.

## 2023-05-21 DIAGNOSIS — M25612 Stiffness of left shoulder, not elsewhere classified: Secondary | ICD-10-CM | POA: Diagnosis not present

## 2023-05-21 DIAGNOSIS — M25512 Pain in left shoulder: Secondary | ICD-10-CM | POA: Diagnosis not present

## 2023-05-27 ENCOUNTER — Ambulatory Visit: Payer: Commercial Managed Care - PPO | Admitting: Family Medicine

## 2023-05-27 DIAGNOSIS — G4733 Obstructive sleep apnea (adult) (pediatric): Secondary | ICD-10-CM | POA: Diagnosis not present

## 2023-05-27 DIAGNOSIS — M25612 Stiffness of left shoulder, not elsewhere classified: Secondary | ICD-10-CM | POA: Diagnosis not present

## 2023-05-27 DIAGNOSIS — M25512 Pain in left shoulder: Secondary | ICD-10-CM | POA: Diagnosis not present

## 2023-05-29 ENCOUNTER — Other Ambulatory Visit (HOSPITAL_COMMUNITY): Payer: Self-pay

## 2023-06-01 ENCOUNTER — Other Ambulatory Visit (HOSPITAL_COMMUNITY): Payer: Self-pay

## 2023-06-03 ENCOUNTER — Other Ambulatory Visit (HOSPITAL_COMMUNITY): Payer: Self-pay

## 2023-06-03 DIAGNOSIS — M25512 Pain in left shoulder: Secondary | ICD-10-CM | POA: Diagnosis not present

## 2023-06-03 DIAGNOSIS — M25612 Stiffness of left shoulder, not elsewhere classified: Secondary | ICD-10-CM | POA: Diagnosis not present

## 2023-06-03 DIAGNOSIS — S42212A Unspecified displaced fracture of surgical neck of left humerus, initial encounter for closed fracture: Secondary | ICD-10-CM | POA: Diagnosis not present

## 2023-06-08 DIAGNOSIS — M25612 Stiffness of left shoulder, not elsewhere classified: Secondary | ICD-10-CM | POA: Diagnosis not present

## 2023-06-08 DIAGNOSIS — M25512 Pain in left shoulder: Secondary | ICD-10-CM | POA: Diagnosis not present

## 2023-06-10 DIAGNOSIS — Z01419 Encounter for gynecological examination (general) (routine) without abnormal findings: Secondary | ICD-10-CM | POA: Diagnosis not present

## 2023-06-10 DIAGNOSIS — Z1231 Encounter for screening mammogram for malignant neoplasm of breast: Secondary | ICD-10-CM | POA: Diagnosis not present

## 2023-06-10 DIAGNOSIS — N952 Postmenopausal atrophic vaginitis: Secondary | ICD-10-CM | POA: Diagnosis not present

## 2023-06-10 DIAGNOSIS — M8580 Other specified disorders of bone density and structure, unspecified site: Secondary | ICD-10-CM | POA: Diagnosis not present

## 2023-06-10 DIAGNOSIS — Z124 Encounter for screening for malignant neoplasm of cervix: Secondary | ICD-10-CM | POA: Diagnosis not present

## 2023-06-10 DIAGNOSIS — Z6833 Body mass index (BMI) 33.0-33.9, adult: Secondary | ICD-10-CM | POA: Diagnosis not present

## 2023-06-12 ENCOUNTER — Other Ambulatory Visit: Payer: Self-pay

## 2023-06-12 ENCOUNTER — Telehealth: Payer: Self-pay

## 2023-06-12 NOTE — Telephone Encounter (Signed)
Dr. Henderson Cloud, patient will be scheduled as soon as possible.  Auth Submission: NO AUTH NEEDED Site of care: Site of care: [facility] Payer: [employer] Medication & CPT/J Code(s) submitted: Reclast (Zolendronic acid) [id number] Route of submission (phone, fax, portal):  Phone # Fax # Auth type: Buy/Bill PB Units/visits requested: 5mg  x 1 dose Reference number:  Approval from: 06/12/23 to 08/11/23

## 2023-06-15 ENCOUNTER — Other Ambulatory Visit: Payer: Self-pay

## 2023-06-16 ENCOUNTER — Other Ambulatory Visit (HOSPITAL_COMMUNITY): Payer: Self-pay

## 2023-06-16 DIAGNOSIS — M25512 Pain in left shoulder: Secondary | ICD-10-CM | POA: Diagnosis not present

## 2023-06-16 DIAGNOSIS — M25612 Stiffness of left shoulder, not elsewhere classified: Secondary | ICD-10-CM | POA: Diagnosis not present

## 2023-06-16 DIAGNOSIS — S92354A Nondisplaced fracture of fifth metatarsal bone, right foot, initial encounter for closed fracture: Secondary | ICD-10-CM | POA: Diagnosis not present

## 2023-06-16 MED ORDER — MELOXICAM 7.5 MG PO TABS
7.5000 mg | ORAL_TABLET | Freq: Every day | ORAL | 0 refills | Status: DC | PRN
  Filled 2023-06-16: qty 30, 30d supply, fill #0

## 2023-06-19 ENCOUNTER — Other Ambulatory Visit (HOSPITAL_COMMUNITY): Payer: Self-pay

## 2023-06-19 NOTE — Progress Notes (Unsigned)
[facility] [address] [place] [address] Phone: [phone] Subjective:   [name], am serving as a scribe for Dr. Antoine Primas.  I'm seeing this patient by the request  of:  Daisy Floro, MD  CC: back and neck pain follow up   [id number]  [name] is a 57 y.o. female coming in with complaint of back and neck pain. OMT 04/01/2023. Humerus fx on 04/13/2023. Patient states that she has been doing much better since fall that caused fx. Currently in PT. C and T spine tightness due to using sling and having to sleep on only one side.   Medications patient has been prescribed: None  Taking:       Patient is scheduled for Reclast on November 22.  Patient is still healing from 2 months ago when patient did have a left humeral neck and greater tuberosity fractures.  Reviewed prior external information including notes and imaging from previsou exam, outside providers and external EMR if available.   As well as notes that were available from care everywhere and other healthcare systems.  Past medical history, social, surgical and family history all reviewed in electronic medical record.  No pertanent information unless stated regarding to the chief complaint.   Past Medical History:  Diagnosis Date   Arthritis    Hip neck right thumb   GERD (gastroesophageal reflux disease)    History of ectopic pregnancy    04/ 2007  S/P LEFT SALPINGECTOMY   History of kidney stones    Hypertension    Hypothyroidism    Menorrhagia    Migraine    PONV (postoperative nausea and vomiting)     No Known Allergies   Review of Systems:  No headache, visual changes, nausea, vomiting, diarrhea, constipation, dizziness, abdominal pain, skin rash, fevers, chills, night sweats, weight loss, swollen lymph nodes, body aches, joint swelling, chest pain, shortness of breath, mood changes. POSITIVE muscle aches  Objective  Blood pressure 114/86,  pulse 76, height 5\' 7"  (1.702 m), weight 210 lb (95.3 kg), SpO2 96%.   General: No apparent distress alert and oriented x3 mood and affect normal, dressed appropriately.  HEENT: Pupils equal, extraocular movements intact  Respiratory: Patient's speak in full sentences and does not appear short of breath  Cardiovascular: No lower extremity edema, non tender, no erythema  Gait MSK:  Back does have some loss lordosis noted.  Some tenderness to palpation in the paraspinal musculature.  Tightness with Pearlean Brownie right greater than left.  Tightness on the right greater trochanteric area.  Osteopathic findings  C2 flexed rotated and side bent right C4 flexed rotated and side bent. C7 flexed rotated and side bent left T3 extended rotated and side bent right inhaled rib T9 extended rotated and side bent left L2 flexed rotated and side bent right Sacrum right on right       Assessment and Plan:  Cervicogenic headache Worsening cervicogenic headaches with patient not being able to use her left arm initially.  Starting to increase activity now at this time.  Discussed icing regimen and home exercises.  Has had multiple different injuries previously.  Follow-up again in 6 to 8 weeks otherwise.    Nonallopathic problems  Decision today to treat with OMT was based on Physical Exam  After verbal consent patient was treated with HVLA, ME, FPR techniques in cervical, rib, thoracic, lumbar, and sacral  areas did avoid manipulation that we put too much strain on  the left humerus  Patient tolerated the procedure well with improvement in symptoms  Patient given exercises, stretches and lifestyle modifications  See medications in patient instructions if given  Patient will follow up in 4-8 weeks    The above documentation has been reviewed and is accurate and complete Judi Saa, DO          Note: This dictation was prepared with Dragon dictation along with smaller phrase technology.  Any transcriptional errors that result from this process are unintentional.

## 2023-06-22 DIAGNOSIS — M25512 Pain in left shoulder: Secondary | ICD-10-CM | POA: Diagnosis not present

## 2023-06-22 DIAGNOSIS — M25612 Stiffness of left shoulder, not elsewhere classified: Secondary | ICD-10-CM | POA: Diagnosis not present

## 2023-06-23 ENCOUNTER — Ambulatory Visit: Payer: Commercial Managed Care - PPO | Admitting: Family Medicine

## 2023-06-23 ENCOUNTER — Encounter: Payer: Self-pay | Admitting: Family Medicine

## 2023-06-23 VITALS — BP 114/86 | HR 76 | Ht 67.0 in | Wt 210.0 lb

## 2023-06-23 DIAGNOSIS — M9908 Segmental and somatic dysfunction of rib cage: Secondary | ICD-10-CM | POA: Diagnosis not present

## 2023-06-23 DIAGNOSIS — G4486 Cervicogenic headache: Secondary | ICD-10-CM

## 2023-06-23 DIAGNOSIS — M9902 Segmental and somatic dysfunction of thoracic region: Secondary | ICD-10-CM | POA: Diagnosis not present

## 2023-06-23 DIAGNOSIS — G4733 Obstructive sleep apnea (adult) (pediatric): Secondary | ICD-10-CM | POA: Diagnosis not present

## 2023-06-23 DIAGNOSIS — M9904 Segmental and somatic dysfunction of sacral region: Secondary | ICD-10-CM | POA: Diagnosis not present

## 2023-06-23 DIAGNOSIS — M9901 Segmental and somatic dysfunction of cervical region: Secondary | ICD-10-CM

## 2023-06-23 NOTE — Assessment & Plan Note (Signed)
Worsening cervicogenic headaches with patient not being able to use her left arm initially.  Starting to increase activity now at this time.  Discussed icing regimen and home exercises.  Has had multiple different injuries previously.  Follow-up again in 6 to 8 weeks otherwise.

## 2023-06-23 NOTE — Patient Instructions (Signed)
Wish you best of luck with potatoes Keep working in PT for the arm See me again in 6 weeks

## 2023-06-27 DIAGNOSIS — G4733 Obstructive sleep apnea (adult) (pediatric): Secondary | ICD-10-CM | POA: Diagnosis not present

## 2023-06-29 DIAGNOSIS — M25512 Pain in left shoulder: Secondary | ICD-10-CM | POA: Diagnosis not present

## 2023-06-29 DIAGNOSIS — M25612 Stiffness of left shoulder, not elsewhere classified: Secondary | ICD-10-CM | POA: Diagnosis not present

## 2023-07-01 ENCOUNTER — Telehealth: Payer: Commercial Managed Care - PPO | Admitting: Family Medicine

## 2023-07-01 ENCOUNTER — Other Ambulatory Visit (HOSPITAL_COMMUNITY): Payer: Self-pay

## 2023-07-01 DIAGNOSIS — R6889 Other general symptoms and signs: Secondary | ICD-10-CM | POA: Diagnosis not present

## 2023-07-01 MED ORDER — OSELTAMIVIR PHOSPHATE 75 MG PO CAPS
75.0000 mg | ORAL_CAPSULE | Freq: Two times a day (BID) | ORAL | 0 refills | Status: AC
  Filled 2023-07-01: qty 10, 5d supply, fill #0

## 2023-07-01 NOTE — Progress Notes (Signed)
E visit for Flu like symptoms   We are sorry that you are not feeling well.  Here is how we plan to help! Based on what you have shared with me it looks like you may have a respiratory virus that may be influenza.  Influenza or "the flu" is   an infection caused by a respiratory virus. The flu virus is highly contagious and persons who did not receive their yearly flu vaccination may "catch" the flu from close contact.  We have anti-viral medications to treat the viruses that cause this infection. They are not a "cure" and only shorten the course of the infection. These prescriptions are most effective when they are given within the first 2 days of "flu" symptoms. Antiviral medication are indicated if you have a high risk of complications from the flu. You should  also consider an antiviral medication if you are in close contact with someone who is at risk. These medications can help patients avoid complications from the flu  but have side effects that you should know. Possible side effects from Tamiflu or oseltamivir include nausea, vomiting, diarrhea, dizziness, headaches, eye redness, sleep problems or other respiratory symptoms. You should not take Tamiflu if you have an allergy to oseltamivir or any to the ingredients in Tamiflu.  Based upon your symptoms and potential risk factors I have prescribed Oseltamivir (Tamiflu).  It has been sent to your designated pharmacy.  You will take one 75 mg capsule orally twice a day for the next 5 days. and I recommend that you follow the flu symptoms recommendation that I have listed below.  ANYONE WHO HAS FLU SYMPTOMS SHOULD: Stay home. The flu is highly contagious and going out or to work exposes others! Be sure to drink plenty of fluids. Water is fine as well as fruit juices, sodas and electrolyte beverages. You may want to stay away from caffeine or alcohol. If you are nauseated, try taking small sips of liquids. How do you know if you are getting enough  fluid? Your urine should be a pale yellow or almost colorless. Get rest. Taking a steamy shower or using a humidifier may help nasal congestion and ease sore throat pain. Using a saline nasal spray works much the same way. Cough drops, hard candies and sore throat lozenges may ease your cough. Line up a caregiver. Have someone check on you regularly.   GET HELP RIGHT AWAY IF: You cannot keep down liquids or your medications. You become short of breath Your fell like you are going to pass out or loose consciousness. Your symptoms persist after you have completed your treatment plan MAKE SURE YOU  Understand these instructions. Will watch your condition. Will get help right away if you are not doing well or get worse.  Your e-visit answers were reviewed by a board certified advanced clinical practitioner to complete your personal care plan.  Depending on the condition, your plan could have included both over the counter or prescription medications.  If there is a problem please reply  once you have received a response from your provider.  Your safety is important to us.  If you have drug allergies check your prescription carefully.    You can use MyChart to ask questions about today's visit, request a non-urgent call back, or ask for a work or school excuse for 24 hours related to this e-Visit. If it has been greater than 24 hours you will need to follow up with your provider, or enter a   new e-Visit to address those concerns.  You will get an e-mail in the next two days asking about your experience.  I hope that your e-visit has been valuable and will speed your recovery. Thank you for using e-visits.  I provided 5 minutes of non face-to-face time during this encounter for chart review, medication and order placement, as well as and documentation.   

## 2023-07-03 ENCOUNTER — Ambulatory Visit: Payer: Commercial Managed Care - PPO

## 2023-07-03 VITALS — BP 140/77 | HR 83 | Temp 97.5°F | Resp 18 | Ht 67.0 in | Wt 210.6 lb

## 2023-07-03 DIAGNOSIS — M810 Age-related osteoporosis without current pathological fracture: Secondary | ICD-10-CM

## 2023-07-03 MED ORDER — DIPHENHYDRAMINE HCL 25 MG PO CAPS
25.0000 mg | ORAL_CAPSULE | Freq: Once | ORAL | Status: AC
  Administered 2023-07-03: 25 mg via ORAL
  Filled 2023-07-03: qty 1

## 2023-07-03 MED ORDER — SODIUM CHLORIDE 0.9 % IV SOLN: INTRAVENOUS | Status: DC

## 2023-07-03 MED ORDER — ACETAMINOPHEN 325 MG PO TABS
650.0000 mg | ORAL_TABLET | Freq: Once | ORAL | Status: AC
Start: 2023-07-03 — End: 2023-07-03
  Administered 2023-07-03: 650 mg via ORAL
  Filled 2023-07-03: qty 2

## 2023-07-03 MED ORDER — ZOLEDRONIC ACID 5 MG/100ML IV SOLN
5.0000 mg | Freq: Once | INTRAVENOUS | Status: AC
Start: 2023-07-03 — End: 2023-07-03
  Administered 2023-07-03: 5 mg via INTRAVENOUS
  Filled 2023-07-03: qty 100

## 2023-07-03 NOTE — Progress Notes (Signed)
Diagnosis: Osteoporosis  Provider:  Chilton Greathouse MD  Procedure: IV Infusion  IV Type: Peripheral, IV Location: L Antecubital  Reclast (Zolendronic Acid), Dose: 5 mg  Infusion Start Time: 1550  Infusion Stop Time: 1625  Post Infusion IV Care: Patient declined observation and Peripheral IV Discontinued  Discharge: Condition: Good, Destination: Home . AVS Declined  Performed by:  Adriana Mccallum, RN

## 2023-07-04 ENCOUNTER — Other Ambulatory Visit (HOSPITAL_COMMUNITY): Payer: Self-pay

## 2023-07-05 ENCOUNTER — Telehealth: Payer: Commercial Managed Care - PPO | Admitting: Nurse Practitioner

## 2023-07-05 DIAGNOSIS — J0100 Acute maxillary sinusitis, unspecified: Secondary | ICD-10-CM

## 2023-07-05 MED ORDER — AMOXICILLIN-POT CLAVULANATE 875-125 MG PO TABS: 1.0000 | ORAL_TABLET | Freq: Two times a day (BID) | ORAL | 0 refills | Status: AC

## 2023-07-05 MED ORDER — PROMETHAZINE-DM 6.25-15 MG/5ML PO SYRP
5.0000 mL | ORAL_SOLUTION | Freq: Four times a day (QID) | ORAL | 0 refills | Status: DC | PRN
Start: 2023-07-05 — End: 2023-09-21

## 2023-07-05 NOTE — Progress Notes (Signed)
I have spent 5 minutes in review of e-visit questionnaire, review and updating patient chart, medical decision making and response to patient.  ° °Jerrell Mangel W Secilia Apps, NP ° °  °

## 2023-07-05 NOTE — Progress Notes (Signed)

## 2023-07-06 DIAGNOSIS — M25612 Stiffness of left shoulder, not elsewhere classified: Secondary | ICD-10-CM | POA: Diagnosis not present

## 2023-07-06 DIAGNOSIS — M25512 Pain in left shoulder: Secondary | ICD-10-CM | POA: Diagnosis not present

## 2023-07-15 DIAGNOSIS — M25512 Pain in left shoulder: Secondary | ICD-10-CM | POA: Diagnosis not present

## 2023-07-15 DIAGNOSIS — M25612 Stiffness of left shoulder, not elsewhere classified: Secondary | ICD-10-CM | POA: Diagnosis not present

## 2023-07-15 DIAGNOSIS — S42212A Unspecified displaced fracture of surgical neck of left humerus, initial encounter for closed fracture: Secondary | ICD-10-CM | POA: Diagnosis not present

## 2023-07-20 DIAGNOSIS — M25512 Pain in left shoulder: Secondary | ICD-10-CM | POA: Diagnosis not present

## 2023-07-20 DIAGNOSIS — M25612 Stiffness of left shoulder, not elsewhere classified: Secondary | ICD-10-CM | POA: Diagnosis not present

## 2023-07-23 DIAGNOSIS — G4733 Obstructive sleep apnea (adult) (pediatric): Secondary | ICD-10-CM | POA: Diagnosis not present

## 2023-07-27 DIAGNOSIS — G4733 Obstructive sleep apnea (adult) (pediatric): Secondary | ICD-10-CM | POA: Diagnosis not present

## 2023-07-28 DIAGNOSIS — M25612 Stiffness of left shoulder, not elsewhere classified: Secondary | ICD-10-CM | POA: Diagnosis not present

## 2023-07-28 DIAGNOSIS — M25512 Pain in left shoulder: Secondary | ICD-10-CM | POA: Diagnosis not present

## 2023-07-30 ENCOUNTER — Ambulatory Visit: Payer: Commercial Managed Care - PPO | Admitting: Adult Health

## 2023-07-30 ENCOUNTER — Encounter: Payer: Self-pay | Admitting: Adult Health

## 2023-07-30 VITALS — BP 107/71 | HR 89 | Ht 67.0 in | Wt 212.2 lb

## 2023-07-30 DIAGNOSIS — G4733 Obstructive sleep apnea (adult) (pediatric): Secondary | ICD-10-CM | POA: Diagnosis not present

## 2023-07-30 DIAGNOSIS — Z6833 Body mass index (BMI) 33.0-33.9, adult: Secondary | ICD-10-CM | POA: Diagnosis not present

## 2023-07-30 NOTE — Patient Instructions (Addendum)
Continue on CPAP At bedtime, wear all night long for at least 6hr or more Work on healthy sleep regimen  Saline nasal spray Twice daily   Saline nasal gel At bedtime  (AYR)  Do not drive is sleepy.  Work on healthy weight  Follow up in 1 year with Dr. Wynona Neat or Roshan Salamon NP  and As needed

## 2023-07-30 NOTE — Progress Notes (Signed)
@Patient  ID: [name], female    DOB: Jul 07, 1966, 57 y.o.   MRN: [id number]  Chief Complaint  Patient presents with   Follow-up    Referring provider: Daisy Floro, MD  HPI: 57 year old female seen for sleep consult February 2024 for daytime sleepiness found to have moderate obstructive sleep apnea  TEST/EVENTS :  Home sleep study October 12, 2022 showed moderate sleep apnea with AHI at 20/hour and SpO2 low at 78%.   07/30/2023 Follow up : OSA  Patient presents for 3-month follow-up.  Patient is followed for moderate obstructive sleep apnea.  Patient remains on CPAP at bedtime.  Patient says she feels she is benefiting from CPAP with decreased daytime sleepiness.  Patient says she did have a traumatic fall with left shoulder injury followed by severe influenza and bronchitis.  She says she was off of her CPAP for about a week but now is back on it over the last 3 weeks.  Says she wears it every single night for about 7 hours.  CPAP download shows 73% compliance.  Daily average usage at 7 hours.  Patient is on auto CPAP 5 to 11 cm H2O.  AHI 0.4/hour. Patient is using it nasal mask.  No Known Allergies  Immunization History  Administered Date(s) Administered   Influenza Split 06/15/2009   Influenza,inj,Quad PF,6+ Mos 05/09/2016   Influenza-Unspecified 05/17/2022   PFIZER(Purple Top)SARS-COV-2 Vaccination 08/29/2019, 09/16/2019   Pfizer Covid-19 Vaccine Bivalent Booster 39yrs & up 06/08/2020   Tdap 03/08/2010, 02/06/2022, 03/03/2022   Zoster, Live 01/27/2020, 05/11/2020    Past Medical History:  Diagnosis Date   Arthritis    Hip neck right thumb   GERD (gastroesophageal reflux disease)    History of ectopic pregnancy    04/ 2007  S/P LEFT SALPINGECTOMY   History of kidney stones    Hypertension    Hypothyroidism    Menorrhagia    Migraine    PONV (postoperative nausea and vomiting)     Tobacco History: Social History   Tobacco Use  Smoking Status Never   Smokeless Tobacco Never   Counseling given: Not Answered   Outpatient Medications Prior to Visit  Medication Sig Dispense Refill   Erenumab-aooe (AIMOVIG) 140 MG/ML SOAJ Inject 140 mg under the skin every 28 days as directed. 1 mL 11   Ibuprofen 200 MG CAPS Take by mouth daily as needed.     irbesartan (AVAPRO) 150 MG tablet Take 1 tablet (150 mg total) by mouth daily. 90 tablet 1   levothyroxine (SYNTHROID) 100 MCG tablet Take 1 tablet (100 mcg total) by mouth every morning on empty stomach. 90 tablet 4   LORazepam (ATIVAN) 1 MG tablet Take 1/2 - 1 tablet by mouth every 8 hours as needed for anxiety. 60 tablet 5   meloxicam (MOBIC) 7.5 MG tablet Take 1 tablet (7.5 mg total) by mouth daily as needed. 30 tablet 0   Multiple Vitamin (MULTIVITAMIN WITH MINERALS) TABS tablet Take 1 tablet by mouth daily.     sertraline (ZOLOFT) 100 MG tablet Take 1 tablet (100 mg total) by mouth daily. 90 tablet 1   Ubrogepant (UBRELVY) 100 MG TABS Take 1 tablet as needed.  May repeat in 2 hours.  Maximum 2 tablets in 24 hours. 16 tablet 11   benzonatate (TESSALON PERLES) 100 MG capsule Take 1 capsule (100 mg total) by mouth 3 (three) times daily as needed. 20 capsule 0   diclofenac Sodium (VOLTAREN) 1 % GEL APPLY 2 GRAMS TO  THE AFFECTED AREA(S) BY TOPICAL ROUTE 4 TIMES PER DAY 150 g 2   fluticasone (FLONASE) 50 MCG/ACT nasal spray Place 2 sprays into both nostrils daily. 16 g 0   meloxicam (MOBIC) 15 MG tablet Take 1 tablet (15 mg total) by mouth once daily with a meal 30 tablet 1   meloxicam (MOBIC) 7.5 MG tablet Take 1 tablet by mouth twice daily for 2 weeks, then as needed. 60 tablet 0   oxyCODONE (ROXICODONE) 5 MG immediate release tablet Take 1 tablet (5 mg total) by mouth every 4 (four) hours as needed. 20 tablet 0   predniSONE (STERAPRED UNI-PAK 21 TAB) 10 MG (21) TBPK tablet Use as directed 21 tablet 0   promethazine-dextromethorphan (PROMETHAZINE-DM) 6.25-15 MG/5ML syrup Take 5 mLs by mouth 4 (four)  times daily as needed for cough. 240 mL 0   No facility-administered medications prior to visit.     Review of Systems:   Constitutional:   No  weight loss, night sweats,  Fevers, chills, fatigue, or  lassitude.  HEENT:   No headaches,  Difficulty swallowing,  Tooth/dental problems, or  Sore throat,                No sneezing, itching, ear ache, nasal congestion, post nasal drip,   CV:  No chest pain,  Orthopnea, PND, swelling in lower extremities, anasarca, dizziness, palpitations, syncope.   GI  No heartburn, indigestion, abdominal pain, nausea, vomiting, diarrhea, change in bowel habits, loss of appetite, bloody stools.   Resp: No shortness of breath with exertion or at rest.  No excess mucus, no productive cough,  No non-productive cough,  No coughing up of blood.  No change in color of mucus.  No wheezing.  No chest wall deformity  Skin: no rash or lesions.  GU: no dysuria, change in color of urine, no urgency or frequency.  No flank pain, no hematuria   MS:  No joint pain or swelling.  No decreased range of motion.  No back pain.    Physical Exam  BP 107/71 (BP Location: Right Arm, Cuff Size: Large)   Pulse 89   Ht 5\' 7"  (1.702 m)   Wt 212 lb 3.2 oz (96.3 kg)   LMP  (LMP Unknown) Comment: been over 1 year  SpO2 94%   BMI 33.24 kg/m   GEN: A/Ox3; pleasant , NAD, well nourished    HEENT:  [place]/AT,  NOSE-clear, THROAT-clear, no lesions, no postnasal drip or exudate noted.  Class 3 MP airway   NECK:  Supple w/ fair ROM; no JVD; normal carotid impulses w/o bruits; no thyromegaly or nodules palpated; no lymphadenopathy.    RESP  Clear  P & A; w/o, wheezes/ rales/ or rhonchi. no accessory muscle use, no dullness to percussion  CARD:  RRR, no m/r/g, no peripheral edema, pulses intact, no cyanosis or clubbing.  GI:   Soft & nt; nml bowel sounds; no organomegaly or masses detected.   Musco: Warm bil, no deformities or joint swelling noted.   Neuro: alert, no focal  deficits noted.    Skin: Warm, no lesions or rashes    Lab Results:    BNP No results found for: "BNP"  ProBNP No results found for: "PROBNP"  Imaging:       No data to display          No results found for: "NITRICOXIDE"      Assessment & Plan:   OSA (obstructive sleep apnea) Excellent control compliance on  nocturnal CPAP.  Continue on current settings.  Patient education on CPAP given.  Plan Patient Instructions  Continue on CPAP At bedtime, wear all night long for at least 6hr or more Work on healthy sleep regimen  Saline nasal spray Twice daily   Saline nasal gel At bedtime  (AYR)  Do not drive is sleepy.  Work on healthy weight  Follow up in 1 year with Dr. Wynona Neat or Paetyn Pietrzak NP  and As needed      Morbid obesity (HCC) Healthy weight loss discussed.     Rubye Oaks, NP 07/30/2023

## 2023-07-30 NOTE — Assessment & Plan Note (Signed)
Excellent control compliance on nocturnal CPAP.  Continue on current settings.  Patient education on CPAP given.  Plan Patient Instructions  Continue on CPAP At bedtime, wear all night long for at least 6hr or more Work on healthy sleep regimen  Saline nasal spray Twice daily   Saline nasal gel At bedtime  (AYR)  Do not drive is sleepy.  Work on healthy weight  Follow up in 1 year with Dr. Wynona Neat or Yadir Zentner NP  and As needed

## 2023-07-30 NOTE — Assessment & Plan Note (Signed)
Healthy weight loss discussed 

## 2023-08-01 ENCOUNTER — Other Ambulatory Visit (HOSPITAL_COMMUNITY): Payer: Self-pay

## 2023-08-03 DIAGNOSIS — M25612 Stiffness of left shoulder, not elsewhere classified: Secondary | ICD-10-CM | POA: Diagnosis not present

## 2023-08-03 DIAGNOSIS — M25512 Pain in left shoulder: Secondary | ICD-10-CM | POA: Diagnosis not present

## 2023-08-03 NOTE — Progress Notes (Signed)
[facility] [address] [place] [address] Phone: [phone] Subjective:   [name], am serving as a scribe for Dr. Antoine Primas.  I'm seeing this patient by the request  of:  Daisy Floro, MD  CC: Neck pain, left shoulder pain  [id number]  [name] is a 57 y.o. female coming in with complaint of back and neck pain. OMT 06/23/2023. Patient states back has been hurting the last couple of days. No other changes did have to put down her dog a couple weeks ago  Medications patient has been prescribed: None  Taking:         Reviewed prior external information including notes and imaging from previsou exam, outside providers and external EMR if available.   As well as notes that were available from care everywhere and other healthcare systems.  Past medical history, social, surgical and family history all reviewed in electronic medical record.  No pertanent information unless stated regarding to the chief complaint.   Past Medical History:  Diagnosis Date   Arthritis    Hip neck right thumb   GERD (gastroesophageal reflux disease)    History of ectopic pregnancy    04/ 2007  S/P LEFT SALPINGECTOMY   History of kidney stones    Hypertension    Hypothyroidism    Menorrhagia    Migraine    PONV (postoperative nausea and vomiting)     No Known Allergies   Review of Systems:  No  visual changes, nausea, vomiting, diarrhea, constipation, dizziness, abdominal pain, skin rash, fevers, chills, night sweats, weight loss, swollen lymph nodes, body aches, joint swelling, chest pain, shortness of breath, mood changes. POSITIVE muscle aches, headache  Objective  Blood pressure 122/84, pulse 87, height 5\' 7"  (1.702 m), SpO2 97%.   General: No apparent distress alert and oriented x3 mood and affect normal, dressed appropriately.  HEENT: Pupils equal, extraocular movements intact  Respiratory: Patient's speak in full  sentences and does not appear short of breath  Cardiovascular: No lower extremity edema, non tender, no erythema  Neck exam significant tightness noted with very minimal range of motion in multiple areas with voluntary guarding noted.  Osteopathic findings  C2 flexed rotated and side bent right C6 flexed rotated and side bent left C7 flexed rotated and side bent right T3 extended rotated and side bent right inhaled rib T9 extended rotated and side bent left L3 flexed rotated and side bent right L5 flexed rotated and side bent left Sacrum right on right       Assessment and Plan:  Cervicogenic headache Chronic, with exacerbation.  Some secondary to increasing stress in her life.  Feel that that is playing a role.  Discussed icing regimen and home exercises.  Continue to work on [employer].  Discussed meloxicam.  15 mg daily.    Nonallopathic problems  Decision today to treat with OMT was based on Physical Exam  After verbal consent patient was treated with HVLA, ME, FPR techniques in cervical, rib, thoracic, lumbar, and sacral  areas  Patient tolerated the procedure well with improvement in symptoms  Patient given exercises, stretches and lifestyle modifications  See medications in patient instructions if given  Patient will follow up in 4-8 weeks    The above documentation has been reviewed and is accurate and complete Judi Saa, DO          Note: This dictation was prepared with Dragon dictation along with  smaller phrase technology. Any transcriptional errors that result from this process are unintentional.

## 2023-08-07 ENCOUNTER — Encounter: Payer: Self-pay | Admitting: Family Medicine

## 2023-08-07 ENCOUNTER — Ambulatory Visit: Payer: Commercial Managed Care - PPO | Admitting: Family Medicine

## 2023-08-07 VITALS — BP 122/84 | HR 87 | Ht 67.0 in

## 2023-08-07 DIAGNOSIS — M9908 Segmental and somatic dysfunction of rib cage: Secondary | ICD-10-CM | POA: Diagnosis not present

## 2023-08-07 DIAGNOSIS — M9902 Segmental and somatic dysfunction of thoracic region: Secondary | ICD-10-CM

## 2023-08-07 DIAGNOSIS — M9903 Segmental and somatic dysfunction of lumbar region: Secondary | ICD-10-CM

## 2023-08-07 DIAGNOSIS — M9901 Segmental and somatic dysfunction of cervical region: Secondary | ICD-10-CM

## 2023-08-07 DIAGNOSIS — G4486 Cervicogenic headache: Secondary | ICD-10-CM

## 2023-08-07 DIAGNOSIS — M9904 Segmental and somatic dysfunction of sacral region: Secondary | ICD-10-CM | POA: Diagnosis not present

## 2023-08-07 NOTE — Patient Instructions (Signed)
Enjoy the longer walks Exercises when you can See me in 7-8 weeks

## 2023-08-07 NOTE — Assessment & Plan Note (Signed)
Chronic, with exacerbation.  Some secondary to increasing stress in her life.  Feel that that is playing a role.  Discussed icing regimen and home exercises.  Continue to work on [employer].  Discussed meloxicam.  15 mg daily.

## 2023-08-10 ENCOUNTER — Other Ambulatory Visit: Payer: Self-pay | Admitting: Physician Assistant

## 2023-08-10 ENCOUNTER — Other Ambulatory Visit (HOSPITAL_BASED_OUTPATIENT_CLINIC_OR_DEPARTMENT_OTHER): Payer: Self-pay

## 2023-08-10 ENCOUNTER — Other Ambulatory Visit (HOSPITAL_COMMUNITY): Payer: Self-pay

## 2023-08-10 MED ORDER — LORAZEPAM 1 MG PO TABS
0.5000 mg | ORAL_TABLET | Freq: Three times a day (TID) | ORAL | 5 refills | Status: DC | PRN
  Filled 2023-08-10: qty 60, 20d supply, fill #0
  Filled 2023-09-13: qty 60, 20d supply, fill #1
  Filled 2023-10-11: qty 60, 20d supply, fill #2
  Filled 2023-11-11: qty 60, 20d supply, fill #3
  Filled 2023-12-10: qty 60, 20d supply, fill #4
  Filled 2024-01-09: qty 60, 20d supply, fill #5

## 2023-08-14 DIAGNOSIS — M25612 Stiffness of left shoulder, not elsewhere classified: Secondary | ICD-10-CM | POA: Diagnosis not present

## 2023-08-14 DIAGNOSIS — M25512 Pain in left shoulder: Secondary | ICD-10-CM | POA: Diagnosis not present

## 2023-08-15 ENCOUNTER — Other Ambulatory Visit (HOSPITAL_COMMUNITY): Payer: Self-pay

## 2023-08-23 DIAGNOSIS — G4733 Obstructive sleep apnea (adult) (pediatric): Secondary | ICD-10-CM | POA: Diagnosis not present

## 2023-08-27 DIAGNOSIS — G4733 Obstructive sleep apnea (adult) (pediatric): Secondary | ICD-10-CM | POA: Diagnosis not present

## 2023-08-28 ENCOUNTER — Other Ambulatory Visit: Payer: Self-pay

## 2023-08-28 ENCOUNTER — Encounter: Payer: Self-pay | Admitting: Neurology

## 2023-08-28 ENCOUNTER — Other Ambulatory Visit (HOSPITAL_COMMUNITY): Payer: Self-pay

## 2023-08-28 DIAGNOSIS — G43009 Migraine without aura, not intractable, without status migrainosus: Secondary | ICD-10-CM

## 2023-08-28 MED ORDER — AIMOVIG 140 MG/ML ~~LOC~~ SOAJ
140.0000 mg | SUBCUTANEOUS | 0 refills | Status: DC
  Filled 2023-08-29: qty 1, 28d supply, fill #0

## 2023-08-29 ENCOUNTER — Other Ambulatory Visit (HOSPITAL_COMMUNITY): Payer: Self-pay

## 2023-09-01 ENCOUNTER — Other Ambulatory Visit: Payer: Self-pay | Admitting: Physician Assistant

## 2023-09-01 ENCOUNTER — Other Ambulatory Visit (HOSPITAL_COMMUNITY): Payer: Self-pay

## 2023-09-01 MED ORDER — IRBESARTAN 150 MG PO TABS
150.0000 mg | ORAL_TABLET | Freq: Every day | ORAL | 1 refills | Status: DC
  Filled 2023-09-01: qty 90, 90d supply, fill #0
  Filled 2023-11-20: qty 90, 90d supply, fill #1

## 2023-09-01 MED ORDER — SERTRALINE HCL 100 MG PO TABS
100.0000 mg | ORAL_TABLET | Freq: Every day | ORAL | 1 refills | Status: DC
  Filled 2023-09-01: qty 90, 90d supply, fill #0
  Filled 2023-11-25: qty 90, 90d supply, fill #1

## 2023-09-02 DIAGNOSIS — M25512 Pain in left shoulder: Secondary | ICD-10-CM | POA: Diagnosis not present

## 2023-09-02 DIAGNOSIS — M25612 Stiffness of left shoulder, not elsewhere classified: Secondary | ICD-10-CM | POA: Diagnosis not present

## 2023-09-14 ENCOUNTER — Other Ambulatory Visit: Payer: Self-pay
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
## 2023-09-15 ENCOUNTER — Encounter: Payer: Self-pay | Admitting: Neurology

## 2023-09-15 ENCOUNTER — Ambulatory Visit: Payer: Commercial Managed Care - PPO | Admitting: Neurology
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
## 2023-09-21 ENCOUNTER — Encounter: Payer: Self-pay | Admitting: Neurology

## 2023-09-21 ENCOUNTER — Other Ambulatory Visit (HOSPITAL_COMMUNITY): Payer: Self-pay

## 2023-09-21 ENCOUNTER — Ambulatory Visit: Payer: Commercial Managed Care - PPO | Admitting: Neurology

## 2023-09-21 DIAGNOSIS — G43009 Migraine without aura, not intractable, without status migrainosus: Secondary | ICD-10-CM

## 2023-09-21 MED ORDER — AIMOVIG 140 MG/ML ~~LOC~~ SOAJ
140.0000 mg | SUBCUTANEOUS | 11 refills | Status: DC
  Filled 2023-09-21: qty 1, 28d supply, fill #0
  Filled 2023-10-18: qty 1, 28d supply, fill #1
  Filled 2023-11-11: qty 1, 28d supply, fill #2
  Filled 2023-12-13: qty 1, 28d supply, fill #3
  Filled 2024-01-10: qty 1, 28d supply, fill #4
  Filled 2024-02-03: qty 1, 28d supply, fill #5
  Filled 2024-03-07: qty 1, 28d supply, fill #6
  Filled 2024-03-31: qty 1, 28d supply, fill #7
  Filled 2024-04-28: qty 1, 28d supply, fill #8
  Filled 2024-05-25: qty 1, 28d supply, fill #9
  Filled 2024-06-29: qty 1, 28d supply, fill #10
  Filled 2024-07-27: qty 1, 28d supply, fill #11

## 2023-09-21 MED ORDER — UBRELVY 100 MG PO TABS
ORAL_TABLET | ORAL | 11 refills | Status: AC
  Filled 2023-09-21: qty 16, 30d supply, fill #0
  Filled 2023-10-18: qty 16, 30d supply, fill #1
  Filled 2023-11-20: qty 16, 30d supply, fill #2
  Filled 2024-01-09: qty 16, 30d supply, fill #3
  Filled 2024-03-07: qty 16, 30d supply, fill #4
  Filled 2024-04-02: qty 16, 30d supply, fill #5
  Filled 2024-05-07: qty 16, 30d supply, fill #6
  Filled 2024-06-07: qty 16, 30d supply, fill #7
  Filled 2024-07-08: qty 16, 30d supply, fill #8
  Filled 2024-08-06: qty 16, 30d supply, fill #9
  Filled 2024-09-04: qty 16, 30d supply, fill #10

## 2023-09-22 ENCOUNTER — Other Ambulatory Visit (HOSPITAL_COMMUNITY): Payer: Self-pay

## 2023-09-29 NOTE — Progress Notes (Unsigned)
 [facility] [address] [place] [address] Phone: [phone] Subjective:   Bruce Donath, am serving as a scribe for Dr. Antoine Primas.  I'm seeing this patient by the request  of:  Daisy Floro, MD  CC: back and neck pain follow up   [id number]  [name] is a 58 y.o. female coming in with complaint of back and neck pain. OMT 08/07/2023. Patient states that for past 2 weeks she has had increase in back pain: lower thoracic and upper lower back. Pain is worse with rolling over in bed and sit to stand.   Medications patient has been prescribed: None  Taking:         Reviewed prior external information including notes and imaging from previsou exam, outside providers and external EMR if available.   As well as notes that were available from care everywhere and other healthcare systems.  Past medical history, social, surgical and family history all reviewed in electronic medical record.  No pertanent information unless stated regarding to the chief complaint.   Past Medical History:  Diagnosis Date   Arthritis    Hip neck right thumb   GERD (gastroesophageal reflux disease)    History of ectopic pregnancy    04/ 2007  S/P LEFT SALPINGECTOMY   History of kidney stones    Hypertension    Hypothyroidism    Menorrhagia    Migraine    PONV (postoperative nausea and vomiting)     No Known Allergies   Review of Systems:  No headache, visual changes, nausea, vomiting, diarrhea, constipation, dizziness, abdominal pain, skin rash, fevers, chills, night sweats, weight loss, swollen lymph nodes, body aches, joint swelling, chest pain, shortness of breath, mood changes. POSITIVE muscle aches  Objective  Blood pressure 110/72, pulse (!) 42, height 5\' 7"  (1.702 m), weight 220 lb (99.8 kg), SpO2 98%.   General: No apparent distress alert and oriented x3 mood and affect normal, dressed appropriately.  HEENT: Pupils equal,  extraocular movements intact  Respiratory: Patient's speak in full sentences and does not appear short of breath  Cardiovascular: No lower extremity edema, non tender, no erythema  MSK:  Back does have some loss lordosis and some tenderness to patient noted. Ttp in the right SI joint   Osteopathic findings  C2 flexed rotated and side bent right C6 flexed rotated and side bent left T3 extended rotated and side bent right inhaled rib T9 extended rotated and side bent left L2 flexed rotated and side bent right L4 flexed rotated and side bent left Sacrum right on right     Assessment and Plan:  Low back pain More aggravation of the low back from the sacroiliac joint on the right greater than left.  Discussed icing regimen and home exercises, which activities to do and which ones to avoid.  Discussed proper lifting mechanics.  Increase activity slowly.  Follow-up again in 6 to 8 weeks    Nonallopathic problems  Decision today to treat with OMT was based on Physical Exam  After verbal consent patient was treated with HVLA, ME, FPR techniques in cervical, rib, thoracic, lumbar, and sacral  areas  Patient tolerated the procedure well with improvement in symptoms  Patient given exercises, stretches and lifestyle modifications  See medications in patient instructions if given  Patient will follow up in 4-8 weeks      The above documentation has been reviewed and is accurate and complete Judi Saa, DO  Note: This dictation was prepared with Dragon dictation along with smaller phrase technology. Any transcriptional errors that result from this process are unintentional.

## 2023-10-01 ENCOUNTER — Other Ambulatory Visit (HOSPITAL_COMMUNITY): Payer: Self-pay

## 2023-10-02 ENCOUNTER — Ambulatory Visit: Payer: Commercial Managed Care - PPO | Admitting: Family Medicine

## 2023-10-02 ENCOUNTER — Encounter: Payer: Self-pay | Admitting: Family Medicine

## 2023-10-02 VITALS — BP 110/72 | HR 42 | Ht 67.0 in | Wt 220.0 lb

## 2023-10-02 DIAGNOSIS — M9902 Segmental and somatic dysfunction of thoracic region: Secondary | ICD-10-CM | POA: Diagnosis not present

## 2023-10-02 DIAGNOSIS — M5442 Lumbago with sciatica, left side: Secondary | ICD-10-CM | POA: Diagnosis not present

## 2023-10-02 DIAGNOSIS — M9903 Segmental and somatic dysfunction of lumbar region: Secondary | ICD-10-CM | POA: Diagnosis not present

## 2023-10-02 DIAGNOSIS — M9904 Segmental and somatic dysfunction of sacral region: Secondary | ICD-10-CM

## 2023-10-02 DIAGNOSIS — M9908 Segmental and somatic dysfunction of rib cage: Secondary | ICD-10-CM | POA: Diagnosis not present

## 2023-10-02 DIAGNOSIS — M9901 Segmental and somatic dysfunction of cervical region: Secondary | ICD-10-CM | POA: Diagnosis not present

## 2023-10-02 DIAGNOSIS — M5441 Lumbago with sciatica, right side: Secondary | ICD-10-CM

## 2023-10-02 NOTE — Patient Instructions (Signed)
See me in 7-8 weeks 

## 2023-10-02 NOTE — Assessment & Plan Note (Signed)
 More aggravation of the low back from the sacroiliac joint on the right greater than left.  Discussed icing regimen and home exercises, which activities to do and which ones to avoid.  Discussed proper lifting mechanics.  Increase activity slowly.  Follow-up again in 6 to 8 weeks

## 2023-10-08 ENCOUNTER — Encounter: Payer: Self-pay | Admitting: Family Medicine

## 2023-10-11 ENCOUNTER — Telehealth: Admitting: Physician Assistant

## 2023-10-11 DIAGNOSIS — B9689 Other specified bacterial agents as the cause of diseases classified elsewhere: Secondary | ICD-10-CM | POA: Diagnosis not present

## 2023-10-11 DIAGNOSIS — N760 Acute vaginitis: Secondary | ICD-10-CM | POA: Diagnosis not present

## 2023-10-11 MED ORDER — METRONIDAZOLE 500 MG PO TABS
500.0000 mg | ORAL_TABLET | Freq: Two times a day (BID) | ORAL | 0 refills | Status: AC
  Filled 2023-10-11: qty 14, 7d supply, fill #0

## 2023-10-11 NOTE — Progress Notes (Signed)
E-Visit for Vaginal Symptoms  We are sorry that you are not feeling well. Here is how we plan to help! Based on what you shared with me it looks like you: May have a vaginosis due to bacteria  Vaginosis is an inflammation of the vagina that can result in discharge, itching and pain. The cause is usually a change in the normal balance of vaginal bacteria or an infection. Vaginosis can also result from reduced estrogen levels after menopause.  The most common causes of vaginosis are:   Bacterial vaginosis which results from an overgrowth of one on several organisms that are normally present in your vagina.   Yeast infections which are caused by a naturally occurring fungus called candida.   Vaginal atrophy (atrophic vaginosis) which results from the thinning of the vagina from reduced estrogen levels after menopause.   Trichomoniasis which is caused by a parasite and is commonly transmitted by sexual intercourse.  Factors that increase your risk of developing vaginosis include: Medications, such as antibiotics and steroids Uncontrolled diabetes Use of hygiene products such as bubble bath, vaginal spray or vaginal deodorant Douching Wearing damp or tight-fitting clothing Using an intrauterine device (IUD) for birth control Hormonal changes, such as those associated with pregnancy, birth control pills or menopause Sexual activity Having a sexually transmitted infection  Your treatment plan is Metronidazole or Flagyl 500mg twice a day for 7 days.  I have electronically sent this prescription into the pharmacy that you have chosen.  Be sure to take all of the medication as directed. Stop taking any medication if you develop a rash, tongue swelling or shortness of breath. Mothers who are breast feeding should consider pumping and discarding their breast milk while on these antibiotics. However, there is no consensus that infant exposure at these doses would be harmful.  Remember that  medication creams can weaken latex condoms. .   HOME CARE:  Good hygiene may prevent some types of vaginosis from recurring and may relieve some symptoms:  Avoid baths, hot tubs and whirlpool spas. Rinse soap from your outer genital area after a shower, and dry the area well to prevent irritation. Don't use scented or harsh soaps, such as those with deodorant or antibacterial action. Avoid irritants. These include scented tampons and pads. Wipe from front to back after using the toilet. Doing so avoids spreading fecal bacteria to your vagina.  Other things that may help prevent vaginosis include:  Don't douche. Your vagina doesn't require cleansing other than normal bathing. Repetitive douching disrupts the normal organisms that reside in the vagina and can actually increase your risk of vaginal infection. Douching won't clear up a vaginal infection. Use a latex condom. Both female and female latex condoms may help you avoid infections spread by sexual contact. Wear cotton underwear. Also wear pantyhose with a cotton crotch. If you feel comfortable without it, skip wearing underwear to bed. Yeast thrives in moist environments Your symptoms should improve in the next day or two.  GET HELP RIGHT AWAY IF:  You have pain in your lower abdomen ( pelvic area or over your ovaries) You develop nausea or vomiting You develop a fever Your discharge changes or worsens You have persistent pain with intercourse You develop shortness of breath, a rapid pulse, or you faint.  These symptoms could be signs of problems or infections that need to be evaluated by a medical provider now.  MAKE SURE YOU   Understand these instructions. Will watch your condition. Will get help right   away if you are not doing well or get worse.  Thank you for choosing an e-visit.  Your e-visit answers were reviewed by a board certified advanced clinical practitioner to complete your personal care plan. Depending upon the  condition, your plan could have included both over the counter or prescription medications.  Please review your pharmacy choice. Make sure the pharmacy is open so you can pick up prescription now. If there is a problem, you may contact your provider through [employer] and have the prescription routed to another pharmacy.  Your safety is important to [place]. If you have drug allergies check your prescription carefully.   For the next 24 hours you can use MyChart to ask questions about today's visit, request a non-urgent call back, or ask for a work or school excuse. You will get an email in the next two days asking about your experience. I hope that your e-visit has been valuable and will speed your recovery. I have spent 5 minutes in review of e-visit questionnaire, review and updating patient chart, medical decision making and response to patient.   Loraine Grip Mayers, PA-C

## 2023-10-12 ENCOUNTER — Other Ambulatory Visit: Payer: Self-pay

## 2023-10-12 ENCOUNTER — Other Ambulatory Visit (HOSPITAL_COMMUNITY): Payer: Self-pay

## 2023-10-19 ENCOUNTER — Other Ambulatory Visit (HOSPITAL_COMMUNITY): Payer: Self-pay

## 2023-10-22 ENCOUNTER — Other Ambulatory Visit (HOSPITAL_COMMUNITY): Payer: Self-pay

## 2023-10-23 DIAGNOSIS — S39012A Strain of muscle, fascia and tendon of lower back, initial encounter: Secondary | ICD-10-CM | POA: Diagnosis not present

## 2023-10-24 ENCOUNTER — Other Ambulatory Visit (HOSPITAL_COMMUNITY): Payer: Self-pay

## 2023-10-24 MED ORDER — METHOCARBAMOL 500 MG PO TABS
500.0000 mg | ORAL_TABLET | Freq: Three times a day (TID) | ORAL | 0 refills | Status: DC | PRN
  Filled 2023-10-24: qty 30, 10d supply, fill #0

## 2023-10-24 MED ORDER — PREDNISONE 10 MG (21) PO TBPK
ORAL_TABLET | ORAL | 0 refills | Status: AC
Start: 2023-10-23 — End: ?
  Filled 2023-10-24: qty 21, 6d supply, fill #0

## 2023-10-30 ENCOUNTER — Telehealth: Payer: Self-pay | Admitting: Pharmacy Technician

## 2023-10-30 ENCOUNTER — Other Ambulatory Visit (HOSPITAL_COMMUNITY): Payer: Self-pay

## 2023-10-30 NOTE — Telephone Encounter (Signed)
 Pharmacy Patient Advocate Encounter   Received notification from CoverMyMeds that prior authorization for Ubrelvy 100MG  tablets is required/requested.   Insurance verification completed.   The patient is insured through [facility] .   Per test claim: PA required; PA submitted to above mentioned insurance via CoverMyMeds Key/confirmation #/EOC [id number] Status is pending

## 2023-11-02 NOTE — Telephone Encounter (Signed)
 Pharmacy Patient Advocate Encounter  Received notification from [facility] that Prior Authorization for Ubrelvy 100MG  tablets  has been APPROVED from 10/30/2023 to 10/29/2024   PA #/Case ID/Reference #: [id number]

## 2023-11-03 ENCOUNTER — Ambulatory Visit: Admitting: Family Medicine

## 2023-11-11 ENCOUNTER — Other Ambulatory Visit: Payer: Self-pay

## 2023-11-13 ENCOUNTER — Ambulatory Visit: Payer: Commercial Managed Care - PPO | Admitting: Family Medicine

## 2023-11-14 ENCOUNTER — Other Ambulatory Visit (HOSPITAL_COMMUNITY): Payer: Self-pay

## 2023-11-16 ENCOUNTER — Other Ambulatory Visit (HOSPITAL_COMMUNITY): Payer: Self-pay

## 2023-11-17 ENCOUNTER — Other Ambulatory Visit (HOSPITAL_COMMUNITY): Payer: Self-pay

## 2023-11-17 MED ORDER — METHOCARBAMOL 500 MG PO TABS
500.0000 mg | ORAL_TABLET | Freq: Three times a day (TID) | ORAL | 0 refills | Status: AC | PRN
Start: 2023-11-17 — End: ?
  Filled 2023-11-17: qty 30, 10d supply, fill #0

## 2023-12-10 NOTE — Progress Notes (Signed)
 [facility] [address] [place] [address] Phone: [phone] Subjective:   [name], am serving as a scribe for Dr. Ronnell Coins.  I'm seeing this patient by the request  of:  Jimmey Mould, MD  CC: Back and neck pain follow-up  LOV:[id number]  [name] is a 58 y.o. female coming in with complaint of back and neck pain. OMT 10/02/2023. Patient states doing well. R hip bursitis acting up. No new symptoms.  Medications patient has been prescribed: None  Taking:      Reviewed prior external information including notes and imaging from previsou exam, outside providers and external EMR if available.   As well as notes that were available from care everywhere and other healthcare systems.  Past medical history, social, surgical and family history all reviewed in electronic medical record.  No pertanent information unless stated regarding to the chief complaint.   Past Medical History:  Diagnosis Date   Arthritis    Hip neck right thumb   GERD (gastroesophageal reflux disease)    History of ectopic pregnancy    04/ 2007  S/P LEFT SALPINGECTOMY   History of kidney stones    Hypertension    Hypothyroidism    Menorrhagia    Migraine    PONV (postoperative nausea and vomiting)     No Known Allergies   Review of Systems:  No  visual changes, nausea, vomiting, diarrhea, constipation, dizziness, abdominal pain, skin rash, fevers, chills, night sweats, weight loss, swollen lymph nodes, body aches, joint swelling, chest pain, shortness of breath, mood changes. POSITIVE muscle aches, headache  Objective  Blood pressure 116/78, pulse 90, height 5\' 7"  (1.702 m), weight 217 lb (98.4 kg), SpO2 98%.   General: No apparent distress alert and oriented x3 mood and affect normal, dressed appropriately.  HEENT: Pupils equal, extraocular movements intact  Respiratory: Patient's speak in full sentences and does not appear short of breath   Cardiovascular: No lower extremity edema, non tender, no erythema  Gait MSK:  Back does have some loss lordosis noted.  Some tenderness to palpation in the paraspinal musculature.  Tightness with Veldon German right greater than left.  Negative stress test noted.  Severe tenderness over the right sacroiliac joint  Osteopathic findings  C2 flexed rotated and side bent right C6 flexed rotated and side bent left T3 extended rotated and side bent right inhaled rib T9 extended rotated and side bent left L2 flexed rotated and side bent right Sacrum right on right   After verbal consent patient was prepped with alcohol swab and with a 21-gauge 2 inch needle injected into the right sacroiliac joint with a total of 0.5 cc of 0.5% Marcaine  and 1 cc of Kenalog 40 mg/mL.  Band-Aid placed.  No blood loss.  Postinjection instructions given.    Assessment and Plan:  Chronic right SI joint pain Chronic problem given injection for diagnostic and therapeutic purposes.  See if patient does not make significant progress.  Discussed with patient that icing regimen and home exercises.  Differential includes lumbar radiculopathy and we will need to continue to monitor.  Discussed icing regimen and home exercises, increase activity slowly.  Follow-up again in 6 to 8 weeks otherwise.    Nonallopathic problems  Decision today to treat with OMT was based on Physical Exam  After verbal consent patient was treated with HVLA, ME, FPR techniques in cervical, rib, thoracic, lumbar, and sacral  areas  Patient tolerated the procedure  well with improvement in symptoms  Patient given exercises, stretches and lifestyle modifications  See medications in patient instructions if given  Patient will follow up in 4-8 weeks     The above documentation has been reviewed and is accurate and complete Mallory Kempker M Evonda Enge, DO         Note: This dictation was prepared with Dragon dictation along with smaller phrase technology.  Any transcriptional errors that result from this process are unintentional.

## 2023-12-11 ENCOUNTER — Other Ambulatory Visit: Payer: Self-pay

## 2023-12-11 ENCOUNTER — Ambulatory Visit: Admitting: Family Medicine

## 2023-12-11 ENCOUNTER — Encounter: Payer: Self-pay | Admitting: Family Medicine

## 2023-12-11 VITALS — BP 116/78 | HR 90 | Ht 67.0 in | Wt 217.0 lb

## 2023-12-11 DIAGNOSIS — M9902 Segmental and somatic dysfunction of thoracic region: Secondary | ICD-10-CM | POA: Diagnosis not present

## 2023-12-11 DIAGNOSIS — M5441 Lumbago with sciatica, right side: Secondary | ICD-10-CM

## 2023-12-11 DIAGNOSIS — M9903 Segmental and somatic dysfunction of lumbar region: Secondary | ICD-10-CM | POA: Diagnosis not present

## 2023-12-11 DIAGNOSIS — M9908 Segmental and somatic dysfunction of rib cage: Secondary | ICD-10-CM

## 2023-12-11 DIAGNOSIS — M9901 Segmental and somatic dysfunction of cervical region: Secondary | ICD-10-CM

## 2023-12-11 DIAGNOSIS — M9904 Segmental and somatic dysfunction of sacral region: Secondary | ICD-10-CM | POA: Diagnosis not present

## 2023-12-11 DIAGNOSIS — G8929 Other chronic pain: Secondary | ICD-10-CM | POA: Diagnosis not present

## 2023-12-11 DIAGNOSIS — M5442 Lumbago with sciatica, left side: Secondary | ICD-10-CM

## 2023-12-11 DIAGNOSIS — M533 Sacrococcygeal disorders, not elsewhere classified: Secondary | ICD-10-CM | POA: Diagnosis not present

## 2023-12-11 NOTE — Patient Instructions (Signed)
 SI injection on R side Good to see you! Enjoy the wedding. See you again in 6 to 8 weeks.

## 2023-12-11 NOTE — Assessment & Plan Note (Signed)
 Continues to give some difficulty.  Discussed icing regimen and home exercises, discussed which activities to do and which ones to avoid.  Increase activity slowly.  Follow-up again in 6 to 8 weeks

## 2023-12-11 NOTE — Assessment & Plan Note (Signed)
 Chronic problem given injection for diagnostic and therapeutic purposes.  See if patient does not make significant progress.  Discussed with patient that icing regimen and home exercises.  Differential includes lumbar radiculopathy and we will need to continue to monitor.  Discussed icing regimen and home exercises, increase activity slowly.  Follow-up again in 6 to 8 weeks otherwise.

## 2023-12-20 DIAGNOSIS — G4733 Obstructive sleep apnea (adult) (pediatric): Secondary | ICD-10-CM | POA: Diagnosis not present

## 2024-01-11 ENCOUNTER — Other Ambulatory Visit: Payer: Self-pay

## 2024-01-22 ENCOUNTER — Ambulatory Visit: Admitting: Family Medicine

## 2024-02-10 ENCOUNTER — Other Ambulatory Visit: Payer: Self-pay | Admitting: Physician Assistant

## 2024-02-10 ENCOUNTER — Other Ambulatory Visit (HOSPITAL_COMMUNITY): Payer: Self-pay

## 2024-02-10 ENCOUNTER — Other Ambulatory Visit: Payer: Self-pay

## 2024-02-10 MED ORDER — LORAZEPAM 1 MG PO TABS
0.5000 mg | ORAL_TABLET | Freq: Three times a day (TID) | ORAL | 5 refills | Status: DC | PRN
  Filled 2024-02-10: qty 60, 20d supply, fill #0
  Filled 2024-03-07: qty 60, 20d supply, fill #1
  Filled 2024-04-01: qty 60, 20d supply, fill #2
  Filled 2024-05-11: qty 60, 20d supply, fill #3
  Filled 2024-06-07: qty 60, 20d supply, fill #4
  Filled 2024-07-08: qty 60, 20d supply, fill #5

## 2024-02-10 MED ORDER — SERTRALINE HCL 100 MG PO TABS
100.0000 mg | ORAL_TABLET | Freq: Every day | ORAL | 1 refills | Status: DC
Start: 2024-02-10 — End: 2024-06-03
  Filled 2024-02-10 – 2024-02-11 (×2): qty 90, 90d supply, fill #0
  Filled 2024-05-25: qty 90, 90d supply, fill #1

## 2024-02-11 ENCOUNTER — Other Ambulatory Visit (HOSPITAL_COMMUNITY): Payer: Self-pay

## 2024-02-11 DIAGNOSIS — E039 Hypothyroidism, unspecified: Secondary | ICD-10-CM | POA: Diagnosis not present

## 2024-02-11 DIAGNOSIS — Z0000 Encounter for general adult medical examination without abnormal findings: Secondary | ICD-10-CM | POA: Diagnosis not present

## 2024-02-17 ENCOUNTER — Other Ambulatory Visit (HOSPITAL_COMMUNITY): Payer: Self-pay

## 2024-02-17 DIAGNOSIS — Z0000 Encounter for general adult medical examination without abnormal findings: Secondary | ICD-10-CM | POA: Diagnosis not present

## 2024-02-17 DIAGNOSIS — E039 Hypothyroidism, unspecified: Secondary | ICD-10-CM | POA: Diagnosis not present

## 2024-02-17 DIAGNOSIS — E669 Obesity, unspecified: Secondary | ICD-10-CM | POA: Diagnosis not present

## 2024-02-17 DIAGNOSIS — E559 Vitamin D deficiency, unspecified: Secondary | ICD-10-CM | POA: Diagnosis not present

## 2024-02-17 DIAGNOSIS — Z6834 Body mass index (BMI) 34.0-34.9, adult: Secondary | ICD-10-CM | POA: Diagnosis not present

## 2024-02-17 DIAGNOSIS — I10 Essential (primary) hypertension: Secondary | ICD-10-CM | POA: Diagnosis not present

## 2024-02-17 DIAGNOSIS — E7800 Pure hypercholesterolemia, unspecified: Secondary | ICD-10-CM | POA: Diagnosis not present

## 2024-02-18 ENCOUNTER — Other Ambulatory Visit (HOSPITAL_COMMUNITY): Payer: Self-pay

## 2024-02-18 MED ORDER — IRBESARTAN 150 MG PO TABS
150.0000 mg | ORAL_TABLET | Freq: Every day | ORAL | 4 refills | Status: AC
  Filled 2024-02-18: qty 90, 90d supply, fill #0
  Filled 2024-05-16: qty 90, 90d supply, fill #1
  Filled 2024-08-26: qty 90, 90d supply, fill #2

## 2024-02-18 MED ORDER — LEVOTHYROXINE SODIUM 100 MCG PO TABS
100.0000 ug | ORAL_TABLET | Freq: Every morning | ORAL | 4 refills | Status: AC
  Filled 2024-02-18 – 2024-04-16 (×4): qty 90, 90d supply, fill #0
  Filled 2024-07-10: qty 90, 90d supply, fill #1

## 2024-02-18 MED ORDER — PHENTERMINE HCL 37.5 MG PO TABS
37.5000 mg | ORAL_TABLET | Freq: Every day | ORAL | 3 refills | Status: AC
  Filled 2024-02-18: qty 30, 30d supply, fill #0
  Filled 2024-03-07 – 2024-03-17 (×3): qty 30, 30d supply, fill #1
  Filled 2024-04-13 (×2): qty 30, 30d supply, fill #2
  Filled 2024-05-18: qty 30, 30d supply, fill #3

## 2024-02-23 NOTE — Progress Notes (Unsigned)
 [name] [address] [place] [address] Phone: [phone] Subjective:   ISusannah Cook, am serving as a scribe for Dr. Arthea Claudene.  I'm seeing this patient by the request  of:  Okey Carlin Redbird, MD  CC: Back and neck pain follow-up  YEP:Dlagzrupcz  [name] is a 58 y.o. female coming in with complaint of back and neck pain. OMT 12/11/2023. Patient states doing well. No new symptoms.  Continues to have tightness  Medications patient has been prescribed: none  Taking:         Reviewed prior external information including notes and imaging from previsou exam, outside providers and external EMR if available.   As well as notes that were available from care everywhere and other healthcare systems.  Past medical history, social, surgical and family history all reviewed in electronic medical record.  No pertanent information unless stated regarding to the chief complaint.   Past Medical History:  Diagnosis Date   Arthritis    Hip neck right thumb   GERD (gastroesophageal reflux disease)    History of ectopic pregnancy    04/ 2007  S/P LEFT SALPINGECTOMY   History of kidney stones    Hypertension    Hypothyroidism    Menorrhagia    Migraine    PONV (postoperative nausea and vomiting)     No Known Allergies   Review of Systems:  No headache, visual changes, nausea, vomiting, diarrhea, constipation, dizziness, abdominal pain, skin rash, fevers, chills, night sweats, weight loss, swollen lymph nodes, body aches, joint swelling, chest pain, shortness of breath, mood changes. POSITIVE muscle aches  Objective  Blood pressure 128/80, pulse (!) 105, height 5' 7 (1.702 m), weight 214 lb (97.1 kg), SpO2 97%.   General: No apparent distress alert and oriented x3 mood and affect normal, dressed appropriately.  HEENT: Pupils equal, extraocular movements intact  Respiratory: Patient's speak in full sentences and does not appear  short of breath  Cardiovascular: No lower extremity edema, non tender, no erythema  Gait MSK:  Back tightness noted in the paraspinal musculature mostly of the upper thoracic spine right greater than left.  Couple trigger points noted.  Low back does have more pain over the right sacroiliac joint.  Positive FABER test noted.  Osteopathic findings  C2 flexed rotated and side bent right C6 flexed rotated and side bent left T3 extended rotated and side bent right inhaled rib T9 extended rotated and side bent left L2 flexed rotated and side bent right L3 flexed rotated and side bent right Sacrum right on right       Assessment and Plan:  Chronic right SI joint pain Seems to be having more difficulty at the moment.  Is doing better though than previous exam.  Discussed icing regimen and home exercises, increase activity slowly.  Follow-up again in 6 to 8 weeks.  Will be having another wedding to go to in November    Nonallopathic problems  Decision today to treat with OMT was based on Physical Exam  After verbal consent patient was treated with HVLA, ME, FPR techniques in cervical, rib, thoracic, lumbar, and sacral  areas  Patient tolerated the procedure well with improvement in symptoms  Patient given exercises, stretches and lifestyle modifications  See medications in patient instructions if given  Patient will follow up in 4-8 weeks     The above documentation has been reviewed and is accurate and complete Blessed Cotham M Avi Archuleta, DO  Note: This dictation was prepared with Dragon dictation along with smaller phrase technology. Any transcriptional errors that result from this process are unintentional.

## 2024-02-24 ENCOUNTER — Ambulatory Visit: Admitting: Family Medicine

## 2024-02-24 ENCOUNTER — Encounter: Payer: Self-pay | Admitting: Family Medicine

## 2024-02-24 VITALS — BP 128/80 | HR 105 | Ht 67.0 in | Wt 214.0 lb

## 2024-02-24 DIAGNOSIS — M9903 Segmental and somatic dysfunction of lumbar region: Secondary | ICD-10-CM

## 2024-02-24 DIAGNOSIS — M9904 Segmental and somatic dysfunction of sacral region: Secondary | ICD-10-CM | POA: Diagnosis not present

## 2024-02-24 DIAGNOSIS — G8929 Other chronic pain: Secondary | ICD-10-CM

## 2024-02-24 DIAGNOSIS — M9901 Segmental and somatic dysfunction of cervical region: Secondary | ICD-10-CM | POA: Diagnosis not present

## 2024-02-24 DIAGNOSIS — M533 Sacrococcygeal disorders, not elsewhere classified: Secondary | ICD-10-CM | POA: Diagnosis not present

## 2024-02-24 DIAGNOSIS — M9908 Segmental and somatic dysfunction of rib cage: Secondary | ICD-10-CM

## 2024-02-24 DIAGNOSIS — M9902 Segmental and somatic dysfunction of thoracic region: Secondary | ICD-10-CM | POA: Diagnosis not present

## 2024-02-24 NOTE — Assessment & Plan Note (Signed)
 Seems to be having more difficulty at the moment.  Is doing better though than previous exam.  Discussed icing regimen and home exercises, increase activity slowly.  Follow-up again in 6 to 8 weeks.  Will be having another wedding to go to in November

## 2024-02-24 NOTE — Patient Instructions (Signed)
 One wedding down one to go Enjoy your vacation See you again in 2-3 months

## 2024-03-08 ENCOUNTER — Other Ambulatory Visit (HOSPITAL_COMMUNITY): Payer: Self-pay

## 2024-03-08 ENCOUNTER — Other Ambulatory Visit: Payer: Self-pay

## 2024-03-08 ENCOUNTER — Ambulatory Visit: Admitting: Family Medicine

## 2024-03-14 ENCOUNTER — Other Ambulatory Visit: Payer: Self-pay

## 2024-03-14 ENCOUNTER — Other Ambulatory Visit (HOSPITAL_COMMUNITY): Payer: Self-pay

## 2024-03-19 DIAGNOSIS — G4733 Obstructive sleep apnea (adult) (pediatric): Secondary | ICD-10-CM | POA: Diagnosis not present

## 2024-04-01 ENCOUNTER — Other Ambulatory Visit (HOSPITAL_COMMUNITY): Payer: Self-pay

## 2024-04-01 ENCOUNTER — Other Ambulatory Visit: Payer: Self-pay

## 2024-04-04 ENCOUNTER — Other Ambulatory Visit: Payer: Self-pay

## 2024-04-13 ENCOUNTER — Other Ambulatory Visit: Payer: Self-pay

## 2024-04-14 ENCOUNTER — Other Ambulatory Visit: Payer: Self-pay

## 2024-04-16 ENCOUNTER — Other Ambulatory Visit (HOSPITAL_COMMUNITY): Payer: Self-pay

## 2024-04-26 ENCOUNTER — Other Ambulatory Visit (HOSPITAL_COMMUNITY): Payer: Self-pay

## 2024-04-26 ENCOUNTER — Other Ambulatory Visit: Payer: Self-pay | Admitting: Physician Assistant

## 2024-04-26 MED ORDER — TRAZODONE HCL 100 MG PO TABS
50.0000 mg | ORAL_TABLET | Freq: Every evening | ORAL | 0 refills | Status: AC | PRN
  Filled 2024-04-26: qty 30, 30d supply, fill #0

## 2024-04-26 NOTE — Progress Notes (Unsigned)
 [name] [address] [place] [address] Phone: [phone] Subjective:   [name], am serving as a scribe for Dr. Arthea Cook.  I'm seeing this patient by the request  of:  Okey Carlin Redbird, MD  CC: Back and neck pain follow-up  YEP:Dlagzrupcz  [name] is a 58 y.o. female coming in with complaint of back and neck pain. OMT 02/24/2024. Patient states that her R glute and lateral hip had been aching. Pain occurs at night when lying down. Also painful during the day when she crosses her legs, right over left.   Medications patient has been prescribed: None  Taking:         Reviewed prior external information including notes and imaging from previsou exam, outside providers and external EMR if available.   As well as notes that were available from care everywhere and other healthcare systems.  Past medical history, social, surgical and family history all reviewed in electronic medical record.  No pertanent information unless stated regarding to the chief complaint.   Past Medical History:  Diagnosis Date   Arthritis    Hip neck right thumb   GERD (gastroesophageal reflux disease)    History of ectopic pregnancy    04/ 2007  S/P LEFT SALPINGECTOMY   History of kidney stones    Hypertension    Hypothyroidism    Menorrhagia    Migraine    PONV (postoperative nausea and vomiting)     No Known Allergies   Review of Systems:  No headache, visual changes, nausea, vomiting, diarrhea, constipation, dizziness, abdominal pain, skin rash, fevers, chills, night sweats, weight loss, swollen lymph nodes, body aches, joint swelling, chest pain, shortness of breath, mood changes. POSITIVE muscle aches  Objective  Blood pressure 110/74, pulse 76, height 5' 7 (1.702 m), weight 180 lb (81.6 kg), SpO2 98%.   General: No apparent distress alert and oriented x3 mood and affect normal, dressed appropriately.  HEENT: Pupils equal,  extraocular movements intact  Respiratory: Patient's speak in full sentences and does not appear short of breath  Cardiovascular: No lower extremity edema, non tender, no erythema  GaitRelatively normal MSK:  Back does have some loss lordosis noted.  Tender over the right greater trochanteric area is quite severe.  Negative straight leg test noted today.  Patient  Osteopathic findings  C2 flexed rotated and side bent right C7 flexed rotated and side bent left T3 extended rotated and side bent right inhaled rib T7 extended rotated and side bent left L2 flexed rotated and side bent right Sacrum right on right    After verbal consent patient was prepped with alcohol swab and with a 21-gauge 2 inch needle injected into the right greater trochanteric area with 2 cc of 0.5% Marcaine  and 1 cc of Kenalog 40 mg/mL.  No blood loss.  Band-Aid placed.  Postinjection instructions given    Assessment and Plan:  Greater trochanteric bursitis of right hip Repeat injection given today.  Differential includes sacroiliac dysfunction and attempted osteopathic manipulation as well.  Discussed which activities to do and which ones to avoid.  Increase activity slowly.  Discussed icing regimen.  Increase activity slowly.  Patient has responded to these injections previously and got over 2 years of improvement each time.  Hopeful that we will see the same.  Follow-up again 2 months  Chronic right SI joint pain Patient did have an injection in the SI joint last time with no significant doing  well greater trochanteric injection with patient feeling better was more localized to this.  Differential does include a lumbar radiculopathy and may need to consider further evaluation and management.  Discussed icing regimen and home exercises.  Follow-up again in 6 to 8 weeks otherwise.    Nonallopathic problems  Decision today to treat with OMT was based on Physical Exam  After verbal consent patient was treated with  HVLA, ME, FPR techniques in cervical, rib, thoracic, lumbar, and sacral  areas  Patient tolerated the procedure well with improvement in symptoms  Patient given exercises, stretches and lifestyle modifications  See medications in patient instructions if given  Patient will follow up in 4-8 weeks      The above documentation has been reviewed and is accurate and complete Lovett Coffin M Atonya Templer, DO        Note: This dictation was prepared with Dragon dictation along with smaller phrase technology. Any transcriptional errors that result from this process are unintentional.

## 2024-04-27 ENCOUNTER — Encounter: Payer: Self-pay | Admitting: Family Medicine

## 2024-04-27 ENCOUNTER — Ambulatory Visit: Admitting: Family Medicine

## 2024-04-27 VITALS — BP 110/74 | HR 76 | Ht 67.0 in | Wt 180.0 lb

## 2024-04-27 DIAGNOSIS — M9902 Segmental and somatic dysfunction of thoracic region: Secondary | ICD-10-CM

## 2024-04-27 DIAGNOSIS — M9904 Segmental and somatic dysfunction of sacral region: Secondary | ICD-10-CM

## 2024-04-27 DIAGNOSIS — M7061 Trochanteric bursitis, right hip: Secondary | ICD-10-CM | POA: Diagnosis not present

## 2024-04-27 DIAGNOSIS — M9901 Segmental and somatic dysfunction of cervical region: Secondary | ICD-10-CM | POA: Diagnosis not present

## 2024-04-27 DIAGNOSIS — M9903 Segmental and somatic dysfunction of lumbar region: Secondary | ICD-10-CM | POA: Diagnosis not present

## 2024-04-27 DIAGNOSIS — M533 Sacrococcygeal disorders, not elsewhere classified: Secondary | ICD-10-CM

## 2024-04-27 DIAGNOSIS — M9908 Segmental and somatic dysfunction of rib cage: Secondary | ICD-10-CM

## 2024-04-27 DIAGNOSIS — G8929 Other chronic pain: Secondary | ICD-10-CM

## 2024-04-27 NOTE — Patient Instructions (Signed)
 Great to see you is always Injected the right greater trochanteric area and I anticipate this doing well. Give it the rest of this week to calm down Have a great wedding #2 See me again in 7 to 9 weeks

## 2024-04-27 NOTE — Assessment & Plan Note (Signed)
 Patient did have an injection in the SI joint last time with no significant doing well greater trochanteric injection with patient feeling better was more localized to this.  Differential does include a lumbar radiculopathy and may need to consider further evaluation and management.  Discussed icing regimen and home exercises.  Follow-up again in 6 to 8 weeks otherwise.

## 2024-04-27 NOTE — Assessment & Plan Note (Signed)
 Repeat injection given today.  Differential includes sacroiliac dysfunction and attempted osteopathic manipulation as well.  Discussed which activities to do and which ones to avoid.  Increase activity slowly.  Discussed icing regimen.  Increase activity slowly.  Patient has responded to these injections previously and got over 2 years of improvement each time.  Hopeful that we will see the same.  Follow-up again 2 months

## 2024-05-12 ENCOUNTER — Other Ambulatory Visit: Payer: Self-pay

## 2024-05-13 ENCOUNTER — Other Ambulatory Visit (HOSPITAL_COMMUNITY): Payer: Self-pay

## 2024-05-17 ENCOUNTER — Other Ambulatory Visit: Payer: Self-pay

## 2024-05-18 ENCOUNTER — Other Ambulatory Visit: Payer: Self-pay

## 2024-05-25 ENCOUNTER — Other Ambulatory Visit (HOSPITAL_COMMUNITY): Payer: Self-pay

## 2024-05-30 ENCOUNTER — Telehealth: Payer: Self-pay | Admitting: Pharmacy Technician

## 2024-05-30 ENCOUNTER — Other Ambulatory Visit (HOSPITAL_COMMUNITY): Payer: Self-pay

## 2024-05-30 NOTE — Telephone Encounter (Signed)
 Pharmacy Patient Advocate Encounter   Received notification from Patient Pharmacy that prior authorization for AIMOVIG  140MG  is required/requested.   Insurance verification completed.   The patient is insured through [facility].   Per test claim: PA required; PA submitted to above mentioned insurance via [employer] Key/confirmation #/EOC [id number] Status is pending

## 2024-05-31 ENCOUNTER — Other Ambulatory Visit (HOSPITAL_COMMUNITY): Payer: Self-pay

## 2024-05-31 NOTE — Telephone Encounter (Signed)
 Pharmacy Patient Advocate Encounter  Received notification from [facility] that Prior Authorization for AIMOVIG  140MG  has been APPROVED from 10.20.25 to 10.19.26. Ran test claim, Copay is $5. This test claim was processed through [facility]- copay amounts may vary at other pharmacies due to pharmacy/plan contracts, or as the patient moves through the different stages of their insurance plan.   PA #/Case ID/Reference #: [id number]

## 2024-06-03 ENCOUNTER — Other Ambulatory Visit: Payer: Self-pay | Admitting: Physician Assistant

## 2024-06-03 ENCOUNTER — Other Ambulatory Visit (HOSPITAL_COMMUNITY): Payer: Self-pay

## 2024-06-06 ENCOUNTER — Other Ambulatory Visit (HOSPITAL_COMMUNITY): Payer: Self-pay

## 2024-06-06 MED ORDER — SERTRALINE HCL 100 MG PO TABS
100.0000 mg | ORAL_TABLET | Freq: Every day | ORAL | 1 refills | Status: AC
  Filled 2024-06-06 – 2024-08-21 (×2): qty 90, 90d supply, fill #0

## 2024-06-07 ENCOUNTER — Other Ambulatory Visit: Payer: Self-pay

## 2024-06-14 DIAGNOSIS — Z1231 Encounter for screening mammogram for malignant neoplasm of breast: Secondary | ICD-10-CM | POA: Diagnosis not present

## 2024-06-14 DIAGNOSIS — Z01419 Encounter for gynecological examination (general) (routine) without abnormal findings: Secondary | ICD-10-CM | POA: Diagnosis not present

## 2024-06-14 DIAGNOSIS — Z6834 Body mass index (BMI) 34.0-34.9, adult: Secondary | ICD-10-CM | POA: Diagnosis not present

## 2024-06-14 DIAGNOSIS — Z13820 Encounter for screening for osteoporosis: Secondary | ICD-10-CM | POA: Diagnosis not present

## 2024-06-20 DIAGNOSIS — G4733 Obstructive sleep apnea (adult) (pediatric): Secondary | ICD-10-CM | POA: Diagnosis not present

## 2024-06-22 ENCOUNTER — Ambulatory Visit: Admitting: Family Medicine

## 2024-06-27 ENCOUNTER — Other Ambulatory Visit: Payer: Self-pay

## 2024-06-27 ENCOUNTER — Other Ambulatory Visit (HOSPITAL_COMMUNITY): Payer: Self-pay

## 2024-06-27 MED ORDER — PROLIA 60 MG/ML ~~LOC~~ SOSY: PREFILLED_SYRINGE | SUBCUTANEOUS | 1 refills | Status: DC

## 2024-06-28 ENCOUNTER — Telehealth: Payer: Self-pay | Admitting: Pharmacist

## 2024-06-28 ENCOUNTER — Telehealth: Payer: Self-pay

## 2024-06-28 ENCOUNTER — Other Ambulatory Visit: Payer: Self-pay

## 2024-06-28 NOTE — Telephone Encounter (Signed)
 Called patient to schedule an appointment for the [facility]. I was unable to reach the patient so I left a HIPAA-compliant message requesting that the patient return my call.   Herlene Fleeta Morris, PharmD, JAQUELINE, CPP Clinical Pharmacist [facility] [phone]

## 2024-06-28 NOTE — Telephone Encounter (Addendum)
 Pharmacy Patient Advocate Encounter   Received notification from Patient Pharmacy that prior authorization for Prolia is required/requested.   Insurance verification completed.   The patient is insured through [facility].   Per test claim: PA required; However, NEW/RECENT labs/notes are needed to complete & submit PA request. Please see below.  Patient's most recent T-score is not in her chart. Faxed provider PA key-[id number]

## 2024-06-28 NOTE — Progress Notes (Addendum)
 PA approved. Patient has copay card. Copay is $0.

## 2024-06-29 NOTE — Telephone Encounter (Unsigned)
 Copied from CRM [id number]. Topic: General - Other >> Jun 29, 2024 12:16 PM Zebedee SAUNDERS wrote: Reason for CRM: Pt returning Fleeta Tonia Garnette LITTIE, RPH-CPP Pharmacist call, Please call pt at [phone].

## 2024-06-30 ENCOUNTER — Encounter: Payer: Self-pay | Admitting: Pharmacist

## 2024-06-30 ENCOUNTER — Other Ambulatory Visit: Payer: Self-pay

## 2024-06-30 ENCOUNTER — Ambulatory Visit: Attending: Family Medicine | Admitting: Pharmacist

## 2024-06-30 ENCOUNTER — Other Ambulatory Visit: Payer: Self-pay | Admitting: Pharmacist

## 2024-06-30 DIAGNOSIS — Z79899 Other long term (current) drug therapy: Secondary | ICD-10-CM

## 2024-06-30 MED ORDER — PROLIA 60 MG/ML ~~LOC~~ SOSY
PREFILLED_SYRINGE | SUBCUTANEOUS | 1 refills | Status: AC
  Filled 2024-07-05: qty 1, 30d supply, fill #0

## 2024-06-30 NOTE — Addendum Note (Signed)
 Addended by: FLEETA MORRIS, GARNETTE L on: 06/30/2024 03:58 PM   Modules accepted: Orders

## 2024-06-30 NOTE — Progress Notes (Signed)
 S:  Patient presents for review of their specialty medication therapy.  Patient is currently taking Prolia for osteoporosis. Patient is managed by Dr. Tomblin for this.   Adherence: has not started   Efficacy: has not started   Dosing: 60 mg subcutaneously once every 6 months.  Dose adjustments: Renal: Monitor patients with severe impairment (CrCl <30 mL/minute or on dialysis) closely, as significant and prolonged hypocalcemia (incidence of 29% and potentially lasting weeks to months) and marked elevations of serum parathyroid hormone are serious risks in this population. Ensure adequate calcium and vitamin D  intake/supplementation. CrCl >=30 mL/minute: No dosage adjustment necessary. CrCl <30 mL/minute: No dosage adjustment necessary; use in conjunction with guidance from patient's nephrology team. Hepatic: no dose adjustments (has not been studied)  Drug-drug interactions: none identified   Screening: TB test: completed  Hepatitis: completed   Monitoring: S/sx of infection: none S/sx of hypersensitivity: none S/sx of hypocalcemia/hypercalcemia: none Dermatitis/skin rash: none Peripheral edema: none HA: none GI upset: none  Other side effects: none  Last bone density study: monitored by her specialist    O:      Lab Results  Component Value Date   WBC 7.0 10/09/2020   HGB 14.3 10/09/2020   HCT 42.3 10/09/2020   MCV 91.5 10/09/2020   PLT 288.0 10/09/2020      Chemistry      Component Value Date/Time   NA 138 10/09/2020 1338   K 4.0 10/09/2020 1338   CL 101 10/09/2020 1338   CO2 31 10/09/2020 1338   BUN 14 10/09/2020 1338   CREATININE 0.74 10/09/2020 1338      Component Value Date/Time   CALCIUM 9.8 10/09/2020 1338   ALKPHOS 58 10/09/2020 1338   AST 22 10/09/2020 1338   ALT 23 10/09/2020 1338   BILITOT 0.8 10/09/2020 1338       A/P: 1. Medication review: Patient currently on Prolia for osteoporosis. Reviewed the medication with the patient,  including the following: Prolia (denosumab) is a monoclonal antibody with affinity for nuclear factor-kappa ligand (RANKL). Prolia binds to RANKL and prevents osteoclast formation, leading to decreased bone resorption and increased bone mass in osteoporosis. Patient educated on purpose, proper use, and potential adverse effects of Prolia. The most common adverse effects are hypersensitivities, peripheral edema, dermatitis/skin rash, GI upset, HA, joint pain, and infection. There is the possibility of atypical femur fracture, serum calcium disturbances, and osteonecrosis of the jaw. Patients should monitor for and report hip, thigh, or groin pain. Additionally, patients should monitor for and report jaw pain, tooth/periodontal infection, toothache, and/or gingival ulceration/erosion. Prolia exists as a solution prefilled syringe for SQ administration. Administration: Denosumab is intended for SubQ route only and should not be administered IV, IM, or intradermally. Prior to administration, bring to room temperature in original container (allow to stand ~15 to 30 minutes); do not warm by any other method. Solution may contain trace amounts of translucent to white protein particles; do not use if cloudy, discolored (normal solution should be clear and colorless to pale yellow), or contains excessive particles or foreign matter. Avoid vigorous shaking. Administer via SubQ injection in the upper arm, upper thigh, or abdomen; should only be administered by a health care professional. No recommendations for any changes at this time.  [name], PharmD, JAQUELINE, CPP Clinical Pharmacist [facility] [phone]

## 2024-06-30 NOTE — Progress Notes (Signed)
 See Ov from 06/30/24.   [name], PharmD, JAQUELINE, CPP Clinical Pharmacist [facility] [phone]

## 2024-06-30 NOTE — Telephone Encounter (Signed)
 Cal returned. CHEP visit completed.

## 2024-06-30 NOTE — Telephone Encounter (Signed)
 Copied from CRM [id number]. Topic: General - Other >> Jun 29, 2024 12:16 PM Zebedee SAUNDERS wrote:  Reason for CRM: Pt returning Fleeta Tonia Garnette LITTIE, RPH-CPP Pharmacist call, Please call pt at [phone].  >> Jun 30, 2024 11:49 AM Joesph NOVAK wrote:  Patient returning phone call for [facility]. She is requesting a call. [phone] is her work number , ask for bed bath & beyond,

## 2024-07-01 ENCOUNTER — Other Ambulatory Visit: Payer: Self-pay

## 2024-07-04 ENCOUNTER — Other Ambulatory Visit: Payer: Self-pay

## 2024-07-05 ENCOUNTER — Other Ambulatory Visit (HOSPITAL_COMMUNITY): Payer: Self-pay

## 2024-07-05 ENCOUNTER — Other Ambulatory Visit: Payer: Self-pay

## 2024-07-05 DIAGNOSIS — M810 Age-related osteoporosis without current pathological fracture: Secondary | ICD-10-CM | POA: Diagnosis not present

## 2024-07-05 NOTE — Progress Notes (Signed)
 Specialty Pharmacy Initial Fill Coordination Note  [name] is a 58 y.o. female contacted today regarding initial fill of specialty medication(s) Denosumab  (Prolia )   Patient requested Delivery   Delivery date: 07/12/24   Verified address: [facility], [address]   Medication will be filled on: 07/11/24   Patient is aware of $0 copayment. Patient has copay card.

## 2024-07-05 NOTE — Telephone Encounter (Signed)
 Pharmacy Patient Advocate Encounter  Received notification from [facility] that Prior Authorization for Prolia  has been APPROVED from 07/05/24 to 07/04/25   PA #/Case ID/Reference #: [id number]

## 2024-07-06 ENCOUNTER — Other Ambulatory Visit: Payer: Self-pay

## 2024-07-08 ENCOUNTER — Other Ambulatory Visit: Payer: Self-pay

## 2024-07-09 ENCOUNTER — Other Ambulatory Visit (HOSPITAL_COMMUNITY): Payer: Self-pay

## 2024-07-11 ENCOUNTER — Other Ambulatory Visit (HOSPITAL_COMMUNITY): Payer: Self-pay

## 2024-07-11 ENCOUNTER — Other Ambulatory Visit: Payer: Self-pay

## 2024-07-12 ENCOUNTER — Other Ambulatory Visit (HOSPITAL_COMMUNITY): Payer: Self-pay

## 2024-07-12 ENCOUNTER — Other Ambulatory Visit: Payer: Self-pay

## 2024-07-13 DIAGNOSIS — M810 Age-related osteoporosis without current pathological fracture: Secondary | ICD-10-CM | POA: Diagnosis not present

## 2024-07-18 ENCOUNTER — Ambulatory Visit: Admitting: Adult Health

## 2024-07-18 ENCOUNTER — Encounter: Payer: Self-pay | Admitting: Adult Health

## 2024-07-18 ENCOUNTER — Telehealth: Admitting: Adult Health

## 2024-07-18 DIAGNOSIS — G4733 Obstructive sleep apnea (adult) (pediatric): Secondary | ICD-10-CM | POA: Diagnosis not present

## 2024-07-18 NOTE — Patient Instructions (Signed)
 Continue on CPAP At bedtime, wear all night long for at least 6hr or more We are reaching out to your DME regarding supply isses  Work on healthy sleep regimen  Saline nasal spray Twice daily   Saline nasal gel At bedtime  (AYR)  Do not drive is sleepy.  Work on healthy weight  Follow up in 1 year with Dr. Neda or Lucretia Pendley NP  and As needed

## 2024-07-18 NOTE — Progress Notes (Signed)
 Virtual Visit via Video Note  I connected with [name] on 07/18/24 at  4:00 PM EST by a video enabled telemedicine application and verified that I am speaking with the correct person using two identifiers.  Location: Patient: Home  Provider: Office    I discussed the limitations of evaluation and management by telemedicine and the availability of in person appointments. The patient expressed understanding and agreed to proceed.  History of Present Illness: 58 year old female seen for sleep consult February 2024 for daytime sleepiness found to have moderate obstructive sleep apnea  Today's video visit is a 1 year follow-up for sleep apnea.  Patient says overall she is doing well on CPAP.  She feels that she benefits from CPAP.  She wears her CPAP every single night.  CPAP download shows excellent compliance with daily usage at 7.5 hours.  She is on auto CPAP 5 to 11 cm H2O.  AHI 0.4/hour daily average pressure at 10 cm H2O. Patient uses [employer]ear nasal mask. [employer] is DME. Says she has been having difficulty with an accurate supplies.  She has been receiving a chinstrap when she has never used a chinstrap and also receiving partial headgear materials.  We are contacting her DME to help with this issue.   Observations/Objective: Home sleep study October 12, 2022 showed moderate sleep apnea with AHI at 20/hour and SpO2 low at 78%.   Assessment and Plan: Moderate obstructive sleep apnea with excellent control and compliance on CPAP with perceived benefit.  Continue on current settings.  CPAP care discussed.  BMI 28.  Continue with healthy diet.  Plan  Patient Instructions  Continue on CPAP At bedtime, wear all night long for at least 6hr or more We are reaching out to your DME regarding supply isses  Work on healthy sleep regimen  Saline nasal spray Twice daily   Saline nasal gel At bedtime  (AYR)  Do not drive is sleepy.  Work on healthy weight  Follow up in 1 year with Dr. Neda  or Orlie NP  and As needed       Follow Up Instructions:    I discussed the assessment and treatment plan with the patient. The patient was provided an opportunity to ask questions and all were answered. The patient agreed with the plan and demonstrated an understanding of the instructions.   The patient was advised to call back or seek an in-person evaluation if the symptoms worsen or if the condition fails to improve as anticipated.  I provided 20  minutes of non-face-to-face time during this encounter.   Madelin Orlie, NP

## 2024-07-29 DIAGNOSIS — R748 Abnormal levels of other serum enzymes: Secondary | ICD-10-CM | POA: Diagnosis not present

## 2024-08-02 ENCOUNTER — Other Ambulatory Visit (HOSPITAL_COMMUNITY): Payer: Self-pay

## 2024-08-08 ENCOUNTER — Other Ambulatory Visit (HOSPITAL_COMMUNITY): Payer: Self-pay

## 2024-08-08 ENCOUNTER — Other Ambulatory Visit: Payer: Self-pay | Admitting: Physician Assistant

## 2024-08-08 MED ORDER — LORAZEPAM 1 MG PO TABS
0.5000 mg | ORAL_TABLET | Freq: Three times a day (TID) | ORAL | 5 refills | Status: AC | PRN
  Filled 2024-08-08: qty 60, 20d supply, fill #0
  Filled 2024-09-04: qty 60, 20d supply, fill #1

## 2024-08-21 ENCOUNTER — Other Ambulatory Visit (HOSPITAL_COMMUNITY): Payer: Self-pay

## 2024-08-22 ENCOUNTER — Other Ambulatory Visit (HOSPITAL_COMMUNITY): Payer: Self-pay

## 2024-08-28 ENCOUNTER — Telehealth: Admitting: Family

## 2024-08-28 DIAGNOSIS — J069 Acute upper respiratory infection, unspecified: Secondary | ICD-10-CM | POA: Diagnosis not present

## 2024-08-28 MED ORDER — LIDOCAINE VISCOUS HCL 2 % MT SOLN: 15.0000 mL | OROMUCOSAL | 0 refills | Status: AC | PRN

## 2024-08-28 MED ORDER — FLUTICASONE PROPIONATE 50 MCG/ACT NA SUSP: 2.0000 | Freq: Every day | NASAL | 6 refills | Status: AC

## 2024-08-28 MED ORDER — BENZONATATE 100 MG PO CAPS: 100.0000 mg | ORAL_CAPSULE | Freq: Two times a day (BID) | ORAL | 0 refills | Status: AC | PRN

## 2024-08-28 NOTE — Progress Notes (Signed)

## 2024-08-29 ENCOUNTER — Other Ambulatory Visit (HOSPITAL_COMMUNITY): Payer: Self-pay

## 2024-08-31 ENCOUNTER — Other Ambulatory Visit: Payer: Self-pay | Admitting: Neurology

## 2024-08-31 DIAGNOSIS — G43009 Migraine without aura, not intractable, without status migrainosus: Secondary | ICD-10-CM

## 2024-09-01 ENCOUNTER — Other Ambulatory Visit (HOSPITAL_COMMUNITY): Payer: Self-pay

## 2024-09-01 ENCOUNTER — Telehealth: Payer: Self-pay | Admitting: Neurology

## 2024-09-01 ENCOUNTER — Telehealth: Admitting: Family Medicine

## 2024-09-01 DIAGNOSIS — N3000 Acute cystitis without hematuria: Secondary | ICD-10-CM

## 2024-09-01 MED ORDER — AIMOVIG 140 MG/ML ~~LOC~~ SOAJ
140.0000 mg | SUBCUTANEOUS | 1 refills | Status: AC
  Filled 2024-09-01: qty 1, 30d supply, fill #0

## 2024-09-01 MED ORDER — NITROFURANTOIN MONOHYD MACRO 100 MG PO CAPS
100.0000 mg | ORAL_CAPSULE | Freq: Two times a day (BID) | ORAL | 0 refills | Status: AC
  Filled 2024-09-01: qty 14, 7d supply, fill #0

## 2024-09-01 NOTE — Telephone Encounter (Signed)
 Pt called in this afternoon and she stated that she needs to get a prescription for the Aimovig . Pt stated that the pharmacy has sent over the request for  the Aimovig  as well.Pt stated that can she come in and pick up a sample for Aimovig  while it is pending for approval.  Please call. Thanks

## 2024-09-01 NOTE — Progress Notes (Signed)

## 2024-09-01 NOTE — Telephone Encounter (Signed)
 LMOVM for patient.  Medication refilled. No samples available.   PA team can we check to see if a PA is needed.

## 2024-09-01 NOTE — Telephone Encounter (Signed)
 PA not needed, just needed a refill. Was filled today successfully. $49 copay.

## 2024-09-02 ENCOUNTER — Other Ambulatory Visit (HOSPITAL_COMMUNITY): Payer: Self-pay

## 2024-09-05 ENCOUNTER — Other Ambulatory Visit: Payer: Self-pay

## 2024-09-20 ENCOUNTER — Ambulatory Visit: Payer: Commercial Managed Care - PPO | Admitting: Neurology
